# Patient Record
Sex: Female | Born: 1935 | Race: White | Hispanic: No | Smoking: Never smoker
Health system: Southern US, Community
[De-identification: ages and names within clinical notes are randomized; demographics above are authoritative.]

## PROBLEM LIST (undated history)

## (undated) DIAGNOSIS — G629 Polyneuropathy, unspecified: Secondary | ICD-10-CM

## (undated) DIAGNOSIS — E1149 Type 2 diabetes mellitus with other diabetic neurological complication: Secondary | ICD-10-CM

## (undated) DIAGNOSIS — E785 Hyperlipidemia, unspecified: Secondary | ICD-10-CM

## (undated) DIAGNOSIS — H3530 Unspecified macular degeneration: Secondary | ICD-10-CM

## (undated) DIAGNOSIS — E538 Deficiency of other specified B group vitamins: Secondary | ICD-10-CM

## (undated) DIAGNOSIS — I219 Acute myocardial infarction, unspecified: Secondary | ICD-10-CM

## (undated) DIAGNOSIS — Z889 Allergy status to unspecified drugs, medicaments and biological substances status: Secondary | ICD-10-CM

## (undated) DIAGNOSIS — M545 Low back pain, unspecified: Secondary | ICD-10-CM

## (undated) DIAGNOSIS — G709 Myoneural disorder, unspecified: Secondary | ICD-10-CM

## (undated) DIAGNOSIS — K219 Gastro-esophageal reflux disease without esophagitis: Secondary | ICD-10-CM

## (undated) DIAGNOSIS — G4730 Sleep apnea, unspecified: Secondary | ICD-10-CM

## (undated) DIAGNOSIS — C801 Malignant (primary) neoplasm, unspecified: Secondary | ICD-10-CM

## (undated) DIAGNOSIS — D751 Secondary polycythemia: Secondary | ICD-10-CM

## (undated) DIAGNOSIS — E876 Hypokalemia: Secondary | ICD-10-CM

## (undated) DIAGNOSIS — K5900 Constipation, unspecified: Secondary | ICD-10-CM

## (undated) DIAGNOSIS — D649 Anemia, unspecified: Secondary | ICD-10-CM

## (undated) DIAGNOSIS — F32A Depression, unspecified: Secondary | ICD-10-CM

## (undated) DIAGNOSIS — M1990 Unspecified osteoarthritis, unspecified site: Secondary | ICD-10-CM

## (undated) DIAGNOSIS — F329 Major depressive disorder, single episode, unspecified: Secondary | ICD-10-CM

## (undated) DIAGNOSIS — I10 Essential (primary) hypertension: Secondary | ICD-10-CM

## (undated) DIAGNOSIS — E039 Hypothyroidism, unspecified: Secondary | ICD-10-CM

## (undated) HISTORY — PX: COLONOSCOPY: SHX174

## (undated) HISTORY — DX: Low back pain, unspecified: M54.50

## (undated) HISTORY — DX: Deficiency of other specified B group vitamins: E53.8

## (undated) HISTORY — DX: Anemia, unspecified: D64.9

## (undated) HISTORY — DX: Constipation, unspecified: K59.00

## (undated) HISTORY — DX: Hypokalemia: E87.6

## (undated) HISTORY — DX: Low back pain: M54.5

## (undated) HISTORY — DX: Hyperlipidemia, unspecified: E78.5

## (undated) HISTORY — PX: OTHER SURGICAL HISTORY: SHX169

## (undated) HISTORY — DX: Polyneuropathy, unspecified: G62.9

## (undated) HISTORY — DX: Secondary polycythemia: D75.1

## (undated) HISTORY — PX: TONSILLECTOMY: SUR1361

## (undated) HISTORY — DX: Type 2 diabetes mellitus with other diabetic neurological complication: E11.49

---

## 1982-02-22 HISTORY — PX: ABDOMINAL HYSTERECTOMY: SHX81

## 2006-03-08 ENCOUNTER — Emergency Department (HOSPITAL_COMMUNITY): Admission: EM | Admit: 2006-03-08 | Discharge: 2006-03-08 | Payer: Self-pay | Admitting: Emergency Medicine

## 2010-02-22 LAB — HM COLONOSCOPY

## 2011-01-21 ENCOUNTER — Encounter (INDEPENDENT_AMBULATORY_CARE_PROVIDER_SITE_OTHER): Payer: Self-pay | Admitting: Ophthalmology

## 2011-01-29 ENCOUNTER — Encounter (INDEPENDENT_AMBULATORY_CARE_PROVIDER_SITE_OTHER): Payer: Medicare Other | Admitting: Ophthalmology

## 2011-01-29 DIAGNOSIS — I10 Essential (primary) hypertension: Secondary | ICD-10-CM

## 2011-01-29 DIAGNOSIS — E11319 Type 2 diabetes mellitus with unspecified diabetic retinopathy without macular edema: Secondary | ICD-10-CM

## 2011-01-29 DIAGNOSIS — E1139 Type 2 diabetes mellitus with other diabetic ophthalmic complication: Secondary | ICD-10-CM

## 2011-01-29 DIAGNOSIS — H3530 Unspecified macular degeneration: Secondary | ICD-10-CM

## 2011-02-02 DIAGNOSIS — H3530 Unspecified macular degeneration: Secondary | ICD-10-CM

## 2011-02-02 DIAGNOSIS — E1136 Type 2 diabetes mellitus with diabetic cataract: Secondary | ICD-10-CM

## 2011-02-02 NOTE — H&P (Signed)
Abigail Edwards is an 76 y.o. female.   Chief Complaint: Poor vision in the left eye HPI: Gradual decrease in vision  Left eye.  difficulty reading and performing household tasks  No past medical history on file.  No past surgical history on file.  No family history on file. Social History:  does not have a smoking history on file. She does not have any smokeless tobacco history on file. Her alcohol and drug histories not on file.  Allergies: Allergies not on file  No current facility-administered medications on file as of .   No current outpatient prescriptions on file as of .    Review of systems otherwise negative  There were no vitals taken for this visit.  Physical exam: Mental status: oriented x3. Eyes: See eye exam scanned into media center associated with this encounter/date Ears, Nose, Throat: within normal limits Neck: Within Normal limits General: within normal limits Chest: Within normal limits Breast: deferred Heart: Within normal limits Abdomen: Within normal limits GU: deferred Extremities: within normal limits Skin: within normal limits  Assessment/Plan Cataract interfering with vision Left eye  Plan: To [facility] for cataract removal with phacoemulsification and placement of Intra Ocular Lens : left eye  Sherrie George 02/02/2011, 7:37 AM

## 2011-02-09 ENCOUNTER — Encounter (HOSPITAL_COMMUNITY): Payer: Self-pay

## 2011-02-23 LAB — HM MAMMOGRAPHY

## 2011-02-24 MED ORDER — GATIFLOXACIN 0.5 % OP SOLN: 1.0000 [drp] | OPHTHALMIC | Status: DC

## 2011-02-24 MED ORDER — TROPICAMIDE 1 % OP SOLN: 1.0000 [drp] | OPHTHALMIC | Status: DC

## 2011-02-24 MED ORDER — CYCLOPENTOLATE HCL 1 % OP SOLN: 1.0000 [drp] | OPHTHALMIC | Status: DC

## 2011-02-24 MED ORDER — PHENYLEPHRINE HCL 2.5 % OP SOLN: 1.0000 [drp] | OPHTHALMIC | Status: DC

## 2011-02-25 ENCOUNTER — Encounter (HOSPITAL_COMMUNITY): Admission: RE | Payer: Self-pay | Source: Ambulatory Visit

## 2011-02-25 ENCOUNTER — Ambulatory Visit (HOSPITAL_COMMUNITY): Admission: RE | Admit: 2011-02-25 | Payer: Medicare Other | Source: Ambulatory Visit | Admitting: Ophthalmology

## 2011-02-25 SURGERY — PHACOEMULSIFICATION, CATARACT, WITH IOL INSERTION
Anesthesia: Monitor Anesthesia Care | Laterality: Left

## 2011-03-05 ENCOUNTER — Inpatient Hospital Stay (INDEPENDENT_AMBULATORY_CARE_PROVIDER_SITE_OTHER): Payer: Medicare Other | Admitting: Ophthalmology

## 2011-03-08 DIAGNOSIS — Z1231 Encounter for screening mammogram for malignant neoplasm of breast: Secondary | ICD-10-CM | POA: Diagnosis not present

## 2011-03-08 DIAGNOSIS — D45 Polycythemia vera: Secondary | ICD-10-CM | POA: Diagnosis not present

## 2011-03-15 DIAGNOSIS — R928 Other abnormal and inconclusive findings on diagnostic imaging of breast: Secondary | ICD-10-CM | POA: Diagnosis not present

## 2011-04-11 ENCOUNTER — Other Ambulatory Visit (INDEPENDENT_AMBULATORY_CARE_PROVIDER_SITE_OTHER): Payer: Self-pay | Admitting: Ophthalmology

## 2011-04-11 NOTE — H&P (Signed)
Abigail Edwards is an 77 y.o. female.   Chief Complaint: Poor vision  Left eye  HPI: Gradual decrease in vision  Left eye difficulty reading and performing household tasks  PMH  Diabetes x 2 years.  Hypertension. Hypothyroid  No past surgical history on file.  No family history on file. Social History:  does not have a smoking history on file. She does not have any smokeless tobacco history on file. Her alcohol and drug histories not on file.  Allergies:  Allergies  Allergen Reactions  . Morphine And Related Other (See Comments)    Pt "went out of her mind"  . Penicillins Rash    No current facility-administered medications on file as of .   Medications Prior to Admission  Medication Sig Dispense Refill  . aspirin EC 81 MG tablet Take 81 mg by mouth daily.        . Cholecalciferol (VITAMIN D3) 5000 UNITS CAPS Take 1 capsule by mouth daily.        . citalopram (CELEXA) 40 MG tablet Take 40 mg by mouth daily.        . Cyanocobalamin (VITAMIN B-12) 2500 MCG SUBL Place 1 tablet under the tongue daily.        Marland Kitchen gabapentin (NEURONTIN) 100 MG capsule Take 100 mg by mouth 2 (two) times daily.        Marland Kitchen guaiFENesin (MUCINEX) 600 MG 12 hr tablet Take 600 mg by mouth 2 (two) times daily as needed. congestion       . metFORMIN (GLUCOPHAGE) 500 MG tablet Take 500 mg by mouth 2 (two) times daily with a meal.        . Multiple Vitamins-Minerals (PRESERVISION/LUTEIN PO) Take 1 tablet by mouth 2 (two) times daily.        . potassium chloride (KLOR-CON) 10 MEQ CR tablet Take 10 mEq by mouth 2 (two) times daily.        . ranitidine (ZANTAC) 150 MG tablet Take 150 mg by mouth 2 (two) times daily.        Marland Kitchen triamterene-hydrochlorothiazide (MAXZIDE-25) 37.5-25 MG per tablet Take 1 tablet by mouth daily.          Review of systems otherwise negative  There were no vitals taken for this visit.  Physical exam: Mental status: oriented x3. Eyes: See eye exam on this date of surgery for this patient  scanned in by scanning center.  See media tab Ears, Nose, Throat: within normal limits Neck: Within Normal limits General: within normal limits Chest: Within normal limits Breast: deferred Heart: Within normal limits Abdomen: Within normal limits GU: deferred Extremities: within normal limits Skin: within normal limits  Assessment/Plan Cataract interfering with vision Left eye  Plan: To [facility] for cataract removal with phacoemulsification and placement of Intra Ocular Lens :Left eye  Abigail Edwards, Beulah Gandy 04/11/2011, 1:33 PM

## 2011-04-14 DIAGNOSIS — L609 Nail disorder, unspecified: Secondary | ICD-10-CM | POA: Diagnosis not present

## 2011-04-14 DIAGNOSIS — E119 Type 2 diabetes mellitus without complications: Secondary | ICD-10-CM | POA: Diagnosis not present

## 2011-04-14 DIAGNOSIS — E1159 Type 2 diabetes mellitus with other circulatory complications: Secondary | ICD-10-CM | POA: Diagnosis not present

## 2011-04-18 DIAGNOSIS — M25869 Other specified joint disorders, unspecified knee: Secondary | ICD-10-CM | POA: Diagnosis not present

## 2011-04-18 DIAGNOSIS — Z23 Encounter for immunization: Secondary | ICD-10-CM | POA: Diagnosis not present

## 2011-04-18 DIAGNOSIS — I10 Essential (primary) hypertension: Secondary | ICD-10-CM | POA: Diagnosis not present

## 2011-04-18 DIAGNOSIS — Z79899 Other long term (current) drug therapy: Secondary | ICD-10-CM | POA: Diagnosis not present

## 2011-04-18 DIAGNOSIS — M19049 Primary osteoarthritis, unspecified hand: Secondary | ICD-10-CM | POA: Diagnosis not present

## 2011-04-18 DIAGNOSIS — Z7982 Long term (current) use of aspirin: Secondary | ICD-10-CM | POA: Diagnosis not present

## 2011-04-18 DIAGNOSIS — M25469 Effusion, unspecified knee: Secondary | ICD-10-CM | POA: Diagnosis not present

## 2011-04-18 DIAGNOSIS — S62233A Other displaced fracture of base of first metacarpal bone, unspecified hand, initial encounter for closed fracture: Secondary | ICD-10-CM | POA: Diagnosis not present

## 2011-04-18 DIAGNOSIS — S59909A Unspecified injury of unspecified elbow, initial encounter: Secondary | ICD-10-CM | POA: Diagnosis not present

## 2011-04-18 DIAGNOSIS — M949 Disorder of cartilage, unspecified: Secondary | ICD-10-CM | POA: Diagnosis not present

## 2011-04-18 DIAGNOSIS — E119 Type 2 diabetes mellitus without complications: Secondary | ICD-10-CM | POA: Diagnosis not present

## 2011-04-18 DIAGNOSIS — S99929A Unspecified injury of unspecified foot, initial encounter: Secondary | ICD-10-CM | POA: Diagnosis not present

## 2011-04-18 DIAGNOSIS — S8990XA Unspecified injury of unspecified lower leg, initial encounter: Secondary | ICD-10-CM | POA: Diagnosis not present

## 2011-04-18 DIAGNOSIS — M899 Disorder of bone, unspecified: Secondary | ICD-10-CM | POA: Diagnosis not present

## 2011-04-18 DIAGNOSIS — IMO0002 Reserved for concepts with insufficient information to code with codable children: Secondary | ICD-10-CM | POA: Diagnosis not present

## 2011-04-18 DIAGNOSIS — S59919A Unspecified injury of unspecified forearm, initial encounter: Secondary | ICD-10-CM | POA: Diagnosis not present

## 2011-04-18 DIAGNOSIS — M11869 Other specified crystal arthropathies, unspecified knee: Secondary | ICD-10-CM | POA: Diagnosis not present

## 2011-04-18 DIAGNOSIS — M25849 Other specified joint disorders, unspecified hand: Secondary | ICD-10-CM | POA: Diagnosis not present

## 2011-04-21 ENCOUNTER — Encounter (HOSPITAL_COMMUNITY): Payer: Self-pay | Admitting: Pharmacy Technician

## 2011-04-21 DIAGNOSIS — S62319A Displaced fracture of base of unspecified metacarpal bone, initial encounter for closed fracture: Secondary | ICD-10-CM | POA: Diagnosis not present

## 2011-04-21 DIAGNOSIS — M25569 Pain in unspecified knee: Secondary | ICD-10-CM | POA: Diagnosis not present

## 2011-04-21 DIAGNOSIS — M25549 Pain in joints of unspecified hand: Secondary | ICD-10-CM | POA: Diagnosis not present

## 2011-05-03 ENCOUNTER — Other Ambulatory Visit (HOSPITAL_COMMUNITY): Payer: Self-pay | Admitting: *Deleted

## 2011-05-03 ENCOUNTER — Encounter (HOSPITAL_COMMUNITY): Payer: Self-pay | Admitting: *Deleted

## 2011-05-03 DIAGNOSIS — S62319A Displaced fracture of base of unspecified metacarpal bone, initial encounter for closed fracture: Secondary | ICD-10-CM | POA: Diagnosis not present

## 2011-05-03 DIAGNOSIS — M25569 Pain in unspecified knee: Secondary | ICD-10-CM | POA: Diagnosis not present

## 2011-05-03 MED ORDER — CLINDAMYCIN PHOSPHATE 600 MG/50ML IV SOLN
600.0000 mg | INTRAVENOUS | Status: DC | PRN
  Filled 2011-05-03: qty 50
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
## 2011-05-04 ENCOUNTER — Other Ambulatory Visit (HOSPITAL_COMMUNITY): Payer: Self-pay | Admitting: Ophthalmology

## 2011-05-04 ENCOUNTER — Other Ambulatory Visit: Payer: Self-pay

## 2011-05-04 ENCOUNTER — Encounter (HOSPITAL_COMMUNITY): Payer: Self-pay | Admitting: Anesthesiology

## 2011-05-04 ENCOUNTER — Encounter (INDEPENDENT_AMBULATORY_CARE_PROVIDER_SITE_OTHER): Payer: Medicare Other | Admitting: Ophthalmology

## 2011-05-04 ENCOUNTER — Encounter (HOSPITAL_COMMUNITY)
Admission: RE | Disposition: A | Discharge status: Home / Self Care | Payer: Self-pay | Source: Ambulatory Visit | Attending: Ophthalmology

## 2011-05-04 ENCOUNTER — Ambulatory Visit (HOSPITAL_COMMUNITY): Payer: Medicare Other | Admitting: Anesthesiology

## 2011-05-04 ENCOUNTER — Ambulatory Visit (HOSPITAL_COMMUNITY): Payer: Medicare Other

## 2011-05-04 ENCOUNTER — Ambulatory Visit (HOSPITAL_COMMUNITY)
Admission: RE | Admit: 2011-05-04 | Discharge: 2011-05-05 | Disposition: A | Discharge status: Home / Self Care | Payer: Medicare Other | Source: Ambulatory Visit | Attending: Ophthalmology | Admitting: Ophthalmology

## 2011-05-04 ENCOUNTER — Encounter (HOSPITAL_COMMUNITY): Payer: Self-pay | Admitting: *Deleted

## 2011-05-04 DIAGNOSIS — H3530 Unspecified macular degeneration: Secondary | ICD-10-CM | POA: Diagnosis not present

## 2011-05-04 DIAGNOSIS — H43819 Vitreous degeneration, unspecified eye: Secondary | ICD-10-CM

## 2011-05-04 DIAGNOSIS — E039 Hypothyroidism, unspecified: Secondary | ICD-10-CM | POA: Diagnosis not present

## 2011-05-04 DIAGNOSIS — J42 Unspecified chronic bronchitis: Secondary | ICD-10-CM | POA: Diagnosis not present

## 2011-05-04 DIAGNOSIS — Z23 Encounter for immunization: Secondary | ICD-10-CM | POA: Insufficient documentation

## 2011-05-04 DIAGNOSIS — H2510 Age-related nuclear cataract, unspecified eye: Secondary | ICD-10-CM

## 2011-05-04 DIAGNOSIS — H269 Unspecified cataract: Secondary | ICD-10-CM | POA: Insufficient documentation

## 2011-05-04 DIAGNOSIS — E1136 Type 2 diabetes mellitus with diabetic cataract: Secondary | ICD-10-CM

## 2011-05-04 DIAGNOSIS — Z01811 Encounter for preprocedural respiratory examination: Secondary | ICD-10-CM | POA: Diagnosis not present

## 2011-05-04 DIAGNOSIS — I517 Cardiomegaly: Secondary | ICD-10-CM | POA: Diagnosis not present

## 2011-05-04 DIAGNOSIS — E119 Type 2 diabetes mellitus without complications: Secondary | ICD-10-CM | POA: Insufficient documentation

## 2011-05-04 DIAGNOSIS — H579 Unspecified disorder of eye and adnexa: Secondary | ICD-10-CM | POA: Diagnosis not present

## 2011-05-04 DIAGNOSIS — E1139 Type 2 diabetes mellitus with other diabetic ophthalmic complication: Secondary | ICD-10-CM | POA: Diagnosis not present

## 2011-05-04 DIAGNOSIS — I10 Essential (primary) hypertension: Secondary | ICD-10-CM | POA: Insufficient documentation

## 2011-05-04 HISTORY — PX: CATARACT EXTRACTION W/PHACO: SHX586

## 2011-05-04 HISTORY — DX: Allergy status to unspecified drugs, medicaments and biological substances: Z88.9

## 2011-05-04 HISTORY — DX: Sleep apnea, unspecified: G47.30

## 2011-05-04 HISTORY — DX: Gastro-esophageal reflux disease without esophagitis: K21.9

## 2011-05-04 HISTORY — DX: Malignant (primary) neoplasm, unspecified: C80.1

## 2011-05-04 HISTORY — DX: Acute myocardial infarction, unspecified: I21.9

## 2011-05-04 HISTORY — DX: Essential (primary) hypertension: I10

## 2011-05-04 HISTORY — DX: Major depressive disorder, single episode, unspecified: F32.9

## 2011-05-04 HISTORY — DX: Unspecified osteoarthritis, unspecified site: M19.90

## 2011-05-04 HISTORY — DX: Hypothyroidism, unspecified: E03.9

## 2011-05-04 HISTORY — DX: Depression, unspecified: F32.A

## 2011-05-04 LAB — CBC
HCT: 40.6 % (ref 36.0–46.0)
Hemoglobin: 13.2 g/dL (ref 12.0–15.0)
MCH: 28 pg (ref 26.0–34.0)
MCHC: 32.5 g/dL (ref 30.0–36.0)
MCV: 86 fL (ref 78.0–100.0)
Platelets: 363 10*3/uL (ref 150–400)
RBC: 4.72 MIL/uL (ref 3.87–5.11)
RDW: 14.3 % (ref 11.5–15.5)
WBC: 9.8 10*3/uL (ref 4.0–10.5)

## 2011-05-04 LAB — BASIC METABOLIC PANEL
BUN: 9 mg/dL (ref 6–23)
CO2: 34 mEq/L — ABNORMAL HIGH (ref 19–32)
Calcium: 9.5 mg/dL (ref 8.4–10.5)
Chloride: 92 mEq/L — ABNORMAL LOW (ref 96–112)
Creatinine, Ser: 0.6 mg/dL (ref 0.50–1.10)
GFR calc Af Amer: >90 mL/min (ref >90)
GFR calc non Af Amer: 88 mL/min — ABNORMAL LOW (ref >90)
Glucose, Bld: 87 mg/dL (ref 70–99)
Potassium: 3.6 mEq/L (ref 3.5–5.1)
Sodium: 133 mEq/L — ABNORMAL LOW (ref 135–145)

## 2011-05-04 LAB — GLUCOSE, CAPILLARY
Glucose-Capillary: 76 mg/dL (ref 70–99)
Glucose-Capillary: 92 mg/dL (ref 70–99)
Glucose-Capillary: 157 mg/dL — ABNORMAL HIGH (ref 70–99)

## 2011-05-04 SURGERY — PHACOEMULSIFICATION, CATARACT, WITH IOL INSERTION
Anesthesia: General | Laterality: Left | Wound class: Clean

## 2011-05-04 MED ORDER — INSULIN ASPART 100 UNIT/ML ~~LOC~~ SOLN: 0.0000 [IU] | Freq: Three times a day (TID) | SUBCUTANEOUS | Status: DC

## 2011-05-04 MED ORDER — MEPERIDINE HCL 25 MG/ML IJ SOLN: 6.2500 mg | INTRAMUSCULAR | Status: DC | PRN

## 2011-05-04 MED ORDER — TETRACAINE HCL 0.5 % OP SOLN
2.0000 [drp] | OPHTHALMIC | Status: DC | PRN
  Filled 2011-05-04: qty 2

## 2011-05-04 MED ORDER — ROCURONIUM BROMIDE 100 MG/10ML IV SOLN
INTRAVENOUS | Status: DC | PRN
  Administered 2011-05-04: 30 mg via INTRAVENOUS

## 2011-05-04 MED ORDER — BUPIVACAINE HCL 0.75 % IJ SOLN
INTRAMUSCULAR | Status: DC | PRN
  Administered 2011-05-04: 10 mL

## 2011-05-04 MED ORDER — EPINEPHRINE HCL 1 MG/ML IJ SOLN
INTRAMUSCULAR | Status: DC | PRN
  Administered 2011-05-04: .3 mL

## 2011-05-04 MED ORDER — GATIFLOXACIN 0.5 % OP SOLN
1.0000 [drp] | OPHTHALMIC | Status: AC | PRN
  Administered 2011-05-04 (×3): 1 [drp] via OPHTHALMIC

## 2011-05-04 MED ORDER — FENTANYL CITRATE 0.05 MG/ML IJ SOLN
INTRAMUSCULAR | Status: DC | PRN
  Administered 2011-05-04: 50 ug via INTRAVENOUS
  Administered 2011-05-04: 100 ug via INTRAVENOUS

## 2011-05-04 MED ORDER — HYDROMORPHONE HCL PF 1 MG/ML IJ SOLN
0.2500 mg | INTRAMUSCULAR | Status: DC | PRN
  Administered 2011-05-04 (×4): 0.25 mg via INTRAVENOUS

## 2011-05-04 MED ORDER — MAGNESIUM HYDROXIDE 400 MG/5ML PO SUSP: 15.0000 mL | Freq: Four times a day (QID) | ORAL | Status: DC | PRN

## 2011-05-04 MED ORDER — GATIFLOXACIN 0.5 % OP SOLN
1.0000 [drp] | Freq: Four times a day (QID) | OPHTHALMIC | Status: DC
  Filled 2011-05-04: qty 2.5

## 2011-05-04 MED ORDER — METFORMIN HCL 500 MG PO TABS
500.0000 mg | ORAL_TABLET | Freq: Two times a day (BID) | ORAL | Status: DC
  Filled 2011-05-04: qty 1

## 2011-05-04 MED ORDER — CYCLOPENTOLATE HCL 1 % OP SOLN
OPHTHALMIC | Status: AC
  Administered 2011-05-04: 1 [drp] via OPHTHALMIC
  Filled 2011-05-04: qty 2

## 2011-05-04 MED ORDER — LIDOCAINE HCL 2 % IJ SOLN
INTRAMUSCULAR | Status: DC | PRN
  Administered 2011-05-04: 10 mL

## 2011-05-04 MED ORDER — TROPICAMIDE 1 % OP SOLN
OPHTHALMIC | Status: AC
  Administered 2011-05-04: 1 [drp] via OPHTHALMIC
  Filled 2011-05-04: qty 3

## 2011-05-04 MED ORDER — BSS IO SOLN
INTRAOCULAR | Status: DC | PRN
  Administered 2011-05-04: 500 mL via INTRAOCULAR

## 2011-05-04 MED ORDER — INSULIN ASPART 100 UNIT/ML ~~LOC~~ SOLN: 0.0000 [IU] | Freq: Every day | SUBCUTANEOUS | Status: DC

## 2011-05-04 MED ORDER — PHENYLEPHRINE HCL 2.5 % OP SOLN
1.0000 [drp] | OPHTHALMIC | Status: AC | PRN
  Administered 2011-05-04 (×3): 1 [drp] via OPHTHALMIC

## 2011-05-04 MED ORDER — ACETAZOLAMIDE SODIUM 500 MG IJ SOLR
500.0000 mg | Freq: Once | INTRAMUSCULAR | Status: AC
  Administered 2011-05-05: 500 mg via INTRAVENOUS
  Filled 2011-05-04: qty 500

## 2011-05-04 MED ORDER — SODIUM CHLORIDE 0.9 % IJ SOLN
INTRAMUSCULAR | Status: DC | PRN
  Administered 2011-05-04: 12:00:00

## 2011-05-04 MED ORDER — PNEUMOCOCCAL VAC POLYVALENT 25 MCG/0.5ML IJ INJ
0.5000 mL | INJECTION | INTRAMUSCULAR | Status: AC
  Administered 2011-05-05: 0.5 mL via INTRAMUSCULAR
  Filled 2011-05-04 (×2): qty 0.5

## 2011-05-04 MED ORDER — SODIUM CHLORIDE 0.9 % IV SOLN
INTRAVENOUS | Status: DC | PRN
  Administered 2011-05-04 (×2): via INTRAVENOUS

## 2011-05-04 MED ORDER — PHENYLEPHRINE HCL 2.5 % OP SOLN
OPHTHALMIC | Status: AC
  Administered 2011-05-04: 1 [drp] via OPHTHALMIC
  Filled 2011-05-04: qty 3

## 2011-05-04 MED ORDER — MORPHINE SULFATE 2 MG/ML IJ SOLN
1.0000 mg | INTRAMUSCULAR | Status: AC | PRN
  Administered 2011-05-04 (×2): 1 mg via INTRAVENOUS

## 2011-05-04 MED ORDER — LATANOPROST 0.005 % OP SOLN
1.0000 [drp] | Freq: Every day | OPHTHALMIC | Status: DC
  Filled 2011-05-04: qty 2.5

## 2011-05-04 MED ORDER — DEXTROSE 5 % IV SOLN
INTRAVENOUS | Status: AC
  Filled 2011-05-04: qty 50

## 2011-05-04 MED ORDER — DEXAMETHASONE SODIUM PHOSPHATE 10 MG/ML IJ SOLN
INTRAMUSCULAR | Status: DC | PRN
  Administered 2011-05-04: 10 mg

## 2011-05-04 MED ORDER — PREDNISOLONE ACETATE 1 % OP SUSP
1.0000 [drp] | Freq: Four times a day (QID) | OPHTHALMIC | Status: DC
  Filled 2011-05-04: qty 1

## 2011-05-04 MED ORDER — NA CHONDROIT SULF-NA HYALURON 40-30 MG/ML IO SOLN
INTRAOCULAR | Status: DC | PRN
  Administered 2011-05-04: 0.5 mL via INTRAOCULAR

## 2011-05-04 MED ORDER — TROPICAMIDE 1 % OP SOLN
1.0000 [drp] | OPHTHALMIC | Status: AC | PRN
  Administered 2011-05-04 (×3): 1 [drp] via OPHTHALMIC

## 2011-05-04 MED ORDER — PROPOFOL 10 MG/ML IV EMUL
INTRAVENOUS | Status: DC | PRN
  Administered 2011-05-04: 180 mg via INTRAVENOUS

## 2011-05-04 MED ORDER — CLINDAMYCIN PHOSPHATE 600 MG/4ML IJ SOLN
INTRAMUSCULAR | Status: AC
  Filled 2011-05-04: qty 4

## 2011-05-04 MED ORDER — NEOSTIGMINE METHYLSULFATE 1 MG/ML IJ SOLN
INTRAMUSCULAR | Status: DC | PRN
  Administered 2011-05-04: 5 mg via INTRAVENOUS

## 2011-05-04 MED ORDER — SODIUM CHLORIDE 0.45 % IV SOLN
INTRAVENOUS | Status: DC
  Administered 2011-05-04: 20:00:00 via INTRAVENOUS

## 2011-05-04 MED ORDER — GLYCOPYRROLATE 0.2 MG/ML IJ SOLN
INTRAMUSCULAR | Status: DC | PRN
  Administered 2011-05-04: .8 mg via INTRAVENOUS

## 2011-05-04 MED ORDER — HYALURONIDASE HUMAN 150 UNIT/ML IJ SOLN
INTRAMUSCULAR | Status: DC | PRN
  Administered 2011-05-04: 150 [IU]

## 2011-05-04 MED ORDER — GATIFLOXACIN 0.5 % OP SOLN
OPHTHALMIC | Status: AC
  Administered 2011-05-04: 1 [drp] via OPHTHALMIC
  Filled 2011-05-04: qty 2.5

## 2011-05-04 MED ORDER — ONDANSETRON HCL 4 MG/2ML IJ SOLN
INTRAMUSCULAR | Status: DC | PRN
  Administered 2011-05-04: 4 mg via INTRAVENOUS

## 2011-05-04 MED ORDER — ACETAMINOPHEN 325 MG PO TABS
325.0000 mg | ORAL_TABLET | ORAL | Status: DC | PRN
  Administered 2011-05-04: 650 mg via ORAL
  Filled 2011-05-04: qty 2

## 2011-05-04 MED ORDER — INSULIN ASPART 100 UNIT/ML ~~LOC~~ SOLN
0.0000 [IU] | Freq: Three times a day (TID) | SUBCUTANEOUS | Status: DC
Start: 2011-05-05 — End: 2011-05-04

## 2011-05-04 MED ORDER — PROVISC 10 MG/ML IO SOLN
INTRAOCULAR | Status: DC | PRN
  Administered 2011-05-04: .85 mL via INTRAOCULAR

## 2011-05-04 MED ORDER — BACITRACIN-POLYMYXIN B 500-10000 UNIT/GM OP OINT
TOPICAL_OINTMENT | OPHTHALMIC | Status: DC | PRN
  Administered 2011-05-04: 1 via OPHTHALMIC

## 2011-05-04 MED ORDER — BRIMONIDINE TARTRATE 0.2 % OP SOLN
1.0000 [drp] | Freq: Two times a day (BID) | OPHTHALMIC | Status: DC
  Filled 2011-05-04: qty 5

## 2011-05-04 MED ORDER — CYCLOPENTOLATE HCL 1 % OP SOLN
1.0000 [drp] | OPHTHALMIC | Status: AC | PRN
  Administered 2011-05-04 (×3): 1 [drp] via OPHTHALMIC

## 2011-05-04 MED ORDER — CLINDAMYCIN PHOSPHATE 600 MG/50ML IV SOLN
INTRAVENOUS | Status: DC | PRN
  Administered 2011-05-04: 600 mg via INTRAVENOUS

## 2011-05-04 MED ORDER — METFORMIN HCL 500 MG PO TABS
500.0000 mg | ORAL_TABLET | Freq: Two times a day (BID) | ORAL | Status: DC
  Administered 2011-05-04 – 2011-05-05 (×2): 500 mg via ORAL
  Filled 2011-05-04 (×4): qty 1

## 2011-05-04 MED ORDER — MORPHINE SULFATE 2 MG/ML IJ SOLN
INTRAMUSCULAR | Status: AC
  Filled 2011-05-04: qty 1

## 2011-05-04 MED ORDER — ONDANSETRON HCL 4 MG/2ML IJ SOLN: 4.0000 mg | Freq: Four times a day (QID) | INTRAMUSCULAR | Status: DC | PRN

## 2011-05-04 MED ORDER — PROMETHAZINE HCL 25 MG/ML IJ SOLN: 6.2500 mg | INTRAMUSCULAR | Status: DC | PRN

## 2011-05-04 MED ORDER — EPHEDRINE SULFATE 50 MG/ML IJ SOLN
INTRAMUSCULAR | Status: DC | PRN
  Administered 2011-05-04: 10 mg via INTRAVENOUS

## 2011-05-04 MED ORDER — SODIUM CHLORIDE 0.9 % IV SOLN
INTRAVENOUS | Status: DC
  Administered 2011-05-04: 12:00:00 via INTRAVENOUS

## 2011-05-04 SURGICAL SUPPLY — 51 items
APL SRG 3 HI ABS STRL LF PLS (MISCELLANEOUS) ×1
APPLICATOR DR MATTHEWS STRL (MISCELLANEOUS) ×2 IMPLANT
BALL CTTN LRG ABS STRL LF (GAUZE/BANDAGES/DRESSINGS) ×3
BLADE KERATOME 2.75 (BLADE) ×2 IMPLANT
BLADE MINI RND TIP GREEN BEAV (BLADE) IMPLANT
BLADE STAB KNIFE 15DEG (BLADE) IMPLANT
CANNULA ANTERIOR CHAMBER 27GA (MISCELLANEOUS) ×2 IMPLANT
CLOTH BEACON ORANGE TIMEOUT ST (SAFETY) ×2 IMPLANT
CORDS BIPOLAR (ELECTRODE) ×2 IMPLANT
COTTONBALL LRG STERILE PKG (GAUZE/BANDAGES/DRESSINGS) ×6 IMPLANT
DRAPE OPHTHALMIC 40X48 W POUCH (DRAPES) ×2 IMPLANT
EYE SHIELD UNIVERSAL CLEAR (GAUZE/BANDAGES/DRESSINGS) ×1 IMPLANT
GLOVE SS BIOGEL STRL SZ 6.5 (GLOVE) ×1 IMPLANT
GLOVE SS BIOGEL STRL SZ 7 (GLOVE) ×1 IMPLANT
GLOVE SUPERSENSE BIOGEL SZ 6.5 (GLOVE) ×1
GLOVE SUPERSENSE BIOGEL SZ 7 (GLOVE) ×1
GLOVE SURG 8.5 LATEX PF (GLOVE) ×2 IMPLANT
GLOVE SURG SS PI 6.0 STRL IVOR (GLOVE) ×1 IMPLANT
GLOVE SURG SS PI 6.5 STRL IVOR (GLOVE) ×2 IMPLANT
GOWN STRL NON-REIN LRG LVL3 (GOWN DISPOSABLE) ×6 IMPLANT
KIT ROOM TURNOVER OR (KITS) ×2 IMPLANT
KNIFE CRESCENT 2.5 55 ANG (BLADE) ×2 IMPLANT
LENS IOL ACRSF IQ PC 24.5 (Intraocular Lens) IMPLANT
LENS IOL ACRYSOF IQ POST 24.5 (Intraocular Lens) ×2 IMPLANT
MARKER SKIN DUAL TIP RULER LAB (MISCELLANEOUS) ×2 IMPLANT
NDL 18GX1X1/2 (RX/OR ONLY) (NEEDLE) ×1 IMPLANT
NDL 25GX 5/8IN NON SAFETY (NEEDLE) IMPLANT
NDL HYPO 30X.5 LL (NEEDLE) ×1 IMPLANT
NEEDLE 18GX1X1/2 (RX/OR ONLY) (NEEDLE) ×2 IMPLANT
NEEDLE 25GX 5/8IN NON SAFETY (NEEDLE) ×2 IMPLANT
NEEDLE 27GAX1X1/2 (NEEDLE) ×1 IMPLANT
NEEDLE HYPO 30X.5 LL (NEEDLE) ×2 IMPLANT
NS IRRIG 1000ML POUR BTL (IV SOLUTION) ×2 IMPLANT
PACK CATARACT CUSTOM (CUSTOM PROCEDURE TRAY) ×2 IMPLANT
PACK CATARACT MCHSCP (PACKS) ×2 IMPLANT
PAD ARMBOARD 7.5X6 YLW CONV (MISCELLANEOUS) ×4 IMPLANT
PAD EYE OVAL STERILE LF (GAUZE/BANDAGES/DRESSINGS) ×1 IMPLANT
PHACO TIP KELMAN 45DEG (TIP) ×2 IMPLANT
PROBE ANTERIOR 20G W/INFUS NDL (MISCELLANEOUS) IMPLANT
SPEAR EYE SURG WECK-CEL (MISCELLANEOUS) IMPLANT
SUT ETHILON 10 0 CS140 6 (SUTURE) ×1 IMPLANT
SUT VICRYL 8 0 TG140 8 (SUTURE) IMPLANT
SYR 20CC LL (SYRINGE) ×2 IMPLANT
SYR 3ML LL SCALE MARK (SYRINGE) ×2 IMPLANT
SYR BULB 3OZ (MISCELLANEOUS) ×2 IMPLANT
SYR TB 1ML LUER SLIP (SYRINGE) ×1 IMPLANT
TAPE SURG TRANSPORE 1 IN (GAUZE/BANDAGES/DRESSINGS) IMPLANT
TAPE SURGICAL TRANSPORE 1 IN (GAUZE/BANDAGES/DRESSINGS) ×1
TOWEL OR 17X24 6PK STRL BLUE (TOWEL DISPOSABLE) ×4 IMPLANT
WATER STERILE IRR 1000ML POUR (IV SOLUTION) ×2 IMPLANT
WIPE INSTRUMENT VISIWIPE 73X73 (MISCELLANEOUS) ×2 IMPLANT

## 2011-05-04 NOTE — Progress Notes (Signed)
Dr. Jacklynn Bue notified of CBG 52. Patient is not symptomatic at this time. No orders received.

## 2011-05-04 NOTE — Preoperative (Signed)
Beta Blockers   Reason not to administer Beta Blockers:Not Applicable 

## 2011-05-04 NOTE — Anesthesia Preprocedure Evaluation (Addendum)
Anesthesia Evaluation  Patient identified by MRN, date of birth, ID band Patient awake    Reviewed: Allergy & Precautions, H&P , NPO status , Patient's Chart, lab work & pertinent test results  History of Anesthesia Complications Negative for: history of anesthetic complications  Airway Mallampati: I  Neck ROM: Full    Dental  (+) Teeth Intact, Caps and Missing   Pulmonary sleep apnea , pneumonia ,  breath sounds clear to auscultation        Cardiovascular hypertension, - Past MI Rhythm:Regular Rate:Normal + Systolic murmurs    Neuro/Psych PSYCHIATRIC DISORDERS    GI/Hepatic GERD-  ,  Endo/Other  Diabetes mellitus-Hypothyroidism   Renal/GU      Musculoskeletal   Abdominal (+) + obese,   Peds  Hematology   Anesthesia Other Findings   Reproductive/Obstetrics                          Anesthesia Physical Anesthesia Plan  ASA: II  Anesthesia Plan: General   Post-op Pain Management:    Induction: Intravenous  Airway Management Planned: Oral ETT  Additional Equipment:   Intra-op Plan:   Post-operative Plan: Extubation in OR  Informed Consent: I have reviewed the patients History and Physical, chart, labs and discussed the procedure including the risks, benefits and alternatives for the proposed anesthesia with the patient or authorized representative who has indicated his/her understanding and acceptance.   Dental advisory given  Plan Discussed with: CRNA, Surgeon and Anesthesiologist  Anesthesia Plan Comments:        Anesthesia Quick Evaluation

## 2011-05-04 NOTE — Transfer of Care (Signed)
Immediate Anesthesia Transfer of Care Note  Patient: Abigail Edwards  Procedure(s) Performed: Procedure(s) (LRB): CATARACT EXTRACTION PHACO AND INTRAOCULAR LENS PLACEMENT (IOC) (Left)  Patient Location: PACU  Anesthesia Type: General  Level of Consciousness: awake, alert , oriented and patient cooperative  Airway & Oxygen Therapy: Patient Spontanous Breathing and Patient connected to face mask oxygen  Post-op Assessment: Report given to PACU RN  Post vital signs: Reviewed and stable  Complications: No apparent anesthesia complications

## 2011-05-04 NOTE — Progress Notes (Signed)
Report received from Angel RN.

## 2011-05-04 NOTE — Brief Op Note (Signed)
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

## 2011-05-04 NOTE — Anesthesia Postprocedure Evaluation (Signed)
  Anesthesia Post-op Note  Patient: Abigail Edwards  Procedure(s) Performed: Procedure(s) (LRB): CATARACT EXTRACTION PHACO AND INTRAOCULAR LENS PLACEMENT (IOC) (Left)  Patient Location: PACU  Anesthesia Type: General  Level of Consciousness: awake, alert  and oriented  Airway and Oxygen Therapy: Patient Spontanous Breathing and Patient connected to nasal cannula oxygen  Post-op Pain: mild  Post-op Assessment: Post-op Vital signs reviewed, Patient's Cardiovascular Status Stable, Respiratory Function Stable, Patent Airway, No signs of Nausea or vomiting and Pain level controlled  Post-op Vital Signs: Reviewed and stable  Complications: No apparent anesthesia complications

## 2011-05-04 NOTE — Anesthesia Procedure Notes (Signed)
Procedure Name: Intubation Date/Time: 05/04/2011 11:56 AM Performed by: Glendora Score A Pre-anesthesia Checklist: Patient identified, Emergency Drugs available, Suction available and Patient being monitored Patient Re-evaluated:Patient Re-evaluated prior to inductionOxygen Delivery Method: Circle system utilized Preoxygenation: Pre-oxygenation with 100% oxygen Intubation Type: IV induction Ventilation: Mask ventilation without difficulty Laryngoscope Size: Miller and 2 Grade View: Grade I Tube type: Oral Tube size: 7.5 mm Number of attempts: 1 Airway Equipment and Method: Stylet and LTA kit utilized Placement Confirmation: ETT inserted through vocal cords under direct vision,  positive ETCO2 and breath sounds checked- equal and bilateral Secured at: 22 cm Tube secured with: Tape Dental Injury: Teeth and Oropharynx as per pre-operative assessment

## 2011-05-04 NOTE — H&P (Signed)
I examined the patient today and there is no change in the medical status 

## 2011-05-05 LAB — GLUCOSE, CAPILLARY: Glucose-Capillary: 92 mg/dL (ref 70–99)

## 2011-05-05 MED ORDER — PREDNISOLONE ACETATE 1 % OP SUSP: 1.0000 [drp] | Freq: Four times a day (QID) | OPHTHALMIC | Status: AC

## 2011-05-05 MED ORDER — GATIFLOXACIN 0.5 % OP SOLN: 1.0000 [drp] | Freq: Four times a day (QID) | OPHTHALMIC | Status: DC

## 2011-05-05 NOTE — Discharge Summary (Signed)
Discharge summary not needed on [facility] patients per medical records. 

## 2011-05-05 NOTE — Op Note (Signed)
NAMECARMELLE, BAMBERG NO.:  [id number]  MEDICAL RECORD NO.:  [id number]  LOCATION:  5122                         FACILITY:  [facility]  PHYSICIAN:  Beulah Gandy. Ashley Royalty, M.D. DATE OF BIRTH:  23-Jul-1934  DATE OF PROCEDURE:  05/04/2011 DATE OF DISCHARGE:                              OPERATIVE REPORT   ADMISSION DIAGNOSIS:  Dense cataract, left eye.  PROCEDURES:  Cataract extraction with phacoemulsification with placement of primary intraocular lens, left eye.  SURGEON:  Beulah Gandy. Ashley Royalty, M.D.  ASSISTANT:  Rosalie Doctor SA.  ANESTHESIA:  General.  NOTE:  The patient has had a recent orthopedic injury and was having moderate pain in her arm.  She stated that she could not lie still and that she want to go to sleep is the reason for the general anesthetic.  DETAILS:  The lashes were trimmed.  Usual prep and draped.  Placement of lid speculum.  Side port made with #15 blade at 10 o'clock.  Provisc placed into the anterior chamber.  Phacoemulsification wound created at 2 o'clock with the Westcott and 0.12 removal of conjunctiva.  Hemostasis with wet-field cautery of the sclera. Three layered corneal-scleral wound into the anterior chamber with a diamond knife, crescent blade, and keratome.  Cystotome incision at 12 o'clock in the anterior capsule. The capsular forceps were used to grasp the capsule and teared in a circular fashion, 5.5 mm in diameter.  The capsular remnants were removed.  Hydrodissection was performed in 3 locations. Phacoemulsification was begun with 3 grooves into the path of the lens nucleus.  The nucleus was rocked and rotated.  The nucleus was split. An additional split was made after the nucleus was rotated to 90 degrees.  The phacoemulsification was used to rotate and remove the nuclear piece.  It broke into multiple segments and came into the phacoemulsification device.  IA tip was used for cortical clean up. Side port suction was used to remove  cortex at 12 o'clock.  Provisc was added as needed for chamber depth.  Once all of the cortex was removed and the lens material was removed, Provisc was placed again into the capsular bag.  A new intraocular lens made by [employer], model SN60WF, power 20.5 D length 13.0 mm, optic 6.0 mm, expiration date of October 2016, serial number [id number] was brought onto the field, placed in the shooter.  The lens was delivered into the anterior chamber then into the posterior chamber and into the capsular bag.  Sinskey and Kuglen hooks were used to rotate the lens into the 12 o'clock and 6 o'clock position.  The IA device was then used to remove all viscoelastic material from the anterior chamber, posterior chamber, and from around the intraocular lens.  BSS was forced around the intraocular lens so that Provisc was removed from posterior aspect of the lens between the lens in the posterior capsule.  There was no vitreous and there was no capsular opening.  The instruments were removed and the wound was tested.  It was found to be secured; however, the patient has a history of recent coughing and one 10-0 suture was placed at 2 o'clock in  the corneoscleral wound.  The wound was tested again and found to be secured.  Polymyxin and gentamicin were irrigated into [place] space. Marcaine was injected around the globe.  Decadron 10 mg was injected to the lower subconjunctival space.  Closing pressure was 10 with a Barraquer tonometer. Complications none.  Duration 45 minutes.  The patient was awakened and taken to recovery in satisfactory condition.     Beulah Gandy. Ashley Royalty, M.D.     JDM/MEDQ  D:  05/04/2011  T:  05/05/2011  Job:  [id number]

## 2011-05-05 NOTE — Progress Notes (Signed)
Patient had already been given home care instructions by night nurse. Verified instructions were given and home meds gone over. Patient verbalisized understanding of all and acknowledged instructions had been given. 05/05/11/0800

## 2011-05-05 NOTE — Progress Notes (Signed)
05/05/2011, 6:55 AM  Mental Status:  Awake, Alert, Oriented  Anterior segment: Cornea  Clear    Anterior Chamber Clear    Lens:   Clear, IOL, Cataract  Intra Ocular Pressure 17 mmHg with Tonopen  Vitreous: Clear   Retina:  Attached Good laser reaction  Impression: Excellent result Retina attached  Final Diagnosis: Active Problems: Cataract   Plan: start post operative eye drops.  Discharge to home.  Give post operative instructions  Sherrie George 05/05/2011, 6:55 AM

## 2011-05-10 ENCOUNTER — Encounter (HOSPITAL_COMMUNITY): Payer: Self-pay | Admitting: Ophthalmology

## 2011-05-12 ENCOUNTER — Encounter (INDEPENDENT_AMBULATORY_CARE_PROVIDER_SITE_OTHER): Payer: Medicare Other | Admitting: Ophthalmology

## 2011-05-12 DIAGNOSIS — H3530 Unspecified macular degeneration: Secondary | ICD-10-CM

## 2011-05-12 DIAGNOSIS — H2700 Aphakia, unspecified eye: Secondary | ICD-10-CM

## 2011-05-19 DIAGNOSIS — M25569 Pain in unspecified knee: Secondary | ICD-10-CM | POA: Diagnosis not present

## 2011-06-07 DIAGNOSIS — D45 Polycythemia vera: Secondary | ICD-10-CM | POA: Diagnosis not present

## 2011-06-14 ENCOUNTER — Encounter (INDEPENDENT_AMBULATORY_CARE_PROVIDER_SITE_OTHER): Payer: Medicare Other | Admitting: Ophthalmology

## 2011-06-16 ENCOUNTER — Encounter (INDEPENDENT_AMBULATORY_CARE_PROVIDER_SITE_OTHER): Payer: Medicare Other | Admitting: Ophthalmology

## 2011-06-16 DIAGNOSIS — H2700 Aphakia, unspecified eye: Secondary | ICD-10-CM

## 2011-06-16 DIAGNOSIS — M25549 Pain in joints of unspecified hand: Secondary | ICD-10-CM | POA: Diagnosis not present

## 2011-07-07 DIAGNOSIS — E1159 Type 2 diabetes mellitus with other circulatory complications: Secondary | ICD-10-CM | POA: Diagnosis not present

## 2011-07-07 DIAGNOSIS — E119 Type 2 diabetes mellitus without complications: Secondary | ICD-10-CM | POA: Diagnosis not present

## 2011-07-07 DIAGNOSIS — L609 Nail disorder, unspecified: Secondary | ICD-10-CM | POA: Diagnosis not present

## 2011-07-21 DIAGNOSIS — L0390 Cellulitis, unspecified: Secondary | ICD-10-CM | POA: Diagnosis not present

## 2011-07-21 DIAGNOSIS — M25473 Effusion, unspecified ankle: Secondary | ICD-10-CM | POA: Diagnosis not present

## 2011-07-21 DIAGNOSIS — M779 Enthesopathy, unspecified: Secondary | ICD-10-CM | POA: Diagnosis not present

## 2011-07-21 DIAGNOSIS — R609 Edema, unspecified: Secondary | ICD-10-CM | POA: Diagnosis not present

## 2011-07-21 DIAGNOSIS — M79609 Pain in unspecified limb: Secondary | ICD-10-CM | POA: Diagnosis not present

## 2011-07-21 DIAGNOSIS — M109 Gout, unspecified: Secondary | ICD-10-CM | POA: Diagnosis not present

## 2011-07-21 DIAGNOSIS — L0291 Cutaneous abscess, unspecified: Secondary | ICD-10-CM | POA: Diagnosis not present

## 2011-09-15 DIAGNOSIS — E1159 Type 2 diabetes mellitus with other circulatory complications: Secondary | ICD-10-CM | POA: Diagnosis not present

## 2011-09-15 DIAGNOSIS — L609 Nail disorder, unspecified: Secondary | ICD-10-CM | POA: Diagnosis not present

## 2011-09-21 ENCOUNTER — Ambulatory Visit (INDEPENDENT_AMBULATORY_CARE_PROVIDER_SITE_OTHER): Payer: Medicare Other | Admitting: Ophthalmology

## 2011-10-07 ENCOUNTER — Other Ambulatory Visit: Payer: Self-pay | Admitting: Internal Medicine

## 2011-10-07 DIAGNOSIS — Z780 Asymptomatic menopausal state: Secondary | ICD-10-CM

## 2011-10-07 DIAGNOSIS — M545 Low back pain, unspecified: Secondary | ICD-10-CM | POA: Diagnosis not present

## 2011-10-07 DIAGNOSIS — G609 Hereditary and idiopathic neuropathy, unspecified: Secondary | ICD-10-CM | POA: Diagnosis not present

## 2011-10-07 DIAGNOSIS — K2100 Gastro-esophageal reflux disease with esophagitis, without bleeding: Secondary | ICD-10-CM | POA: Diagnosis not present

## 2011-10-07 DIAGNOSIS — E876 Hypokalemia: Secondary | ICD-10-CM | POA: Diagnosis not present

## 2011-10-21 DIAGNOSIS — M810 Age-related osteoporosis without current pathological fracture: Secondary | ICD-10-CM | POA: Diagnosis not present

## 2011-10-21 DIAGNOSIS — IMO0001 Reserved for inherently not codable concepts without codable children: Secondary | ICD-10-CM | POA: Diagnosis not present

## 2011-10-21 DIAGNOSIS — I10 Essential (primary) hypertension: Secondary | ICD-10-CM | POA: Diagnosis not present

## 2011-10-21 DIAGNOSIS — E538 Deficiency of other specified B group vitamins: Secondary | ICD-10-CM | POA: Diagnosis not present

## 2011-10-21 DIAGNOSIS — E785 Hyperlipidemia, unspecified: Secondary | ICD-10-CM | POA: Diagnosis not present

## 2011-10-21 DIAGNOSIS — E039 Hypothyroidism, unspecified: Secondary | ICD-10-CM | POA: Diagnosis not present

## 2011-11-09 ENCOUNTER — Ambulatory Visit
Admission: RE | Admit: 2011-11-09 | Discharge: 2011-11-09 | Disposition: A | Discharge status: Home / Self Care | Payer: Medicare Other | Source: Ambulatory Visit | Attending: Internal Medicine | Admitting: Internal Medicine

## 2011-11-09 DIAGNOSIS — Z780 Asymptomatic menopausal state: Secondary | ICD-10-CM

## 2011-11-09 DIAGNOSIS — M545 Low back pain, unspecified: Secondary | ICD-10-CM | POA: Diagnosis not present

## 2011-11-09 DIAGNOSIS — E876 Hypokalemia: Secondary | ICD-10-CM | POA: Diagnosis not present

## 2011-11-09 DIAGNOSIS — K2100 Gastro-esophageal reflux disease with esophagitis, without bleeding: Secondary | ICD-10-CM | POA: Diagnosis not present

## 2011-11-09 DIAGNOSIS — M810 Age-related osteoporosis without current pathological fracture: Secondary | ICD-10-CM | POA: Diagnosis not present

## 2011-11-09 DIAGNOSIS — M899 Disorder of bone, unspecified: Secondary | ICD-10-CM | POA: Diagnosis not present

## 2011-11-09 DIAGNOSIS — M949 Disorder of cartilage, unspecified: Secondary | ICD-10-CM | POA: Diagnosis not present

## 2011-11-10 LAB — HM DEXA SCAN

## 2011-11-24 DIAGNOSIS — E1159 Type 2 diabetes mellitus with other circulatory complications: Secondary | ICD-10-CM | POA: Diagnosis not present

## 2011-11-24 DIAGNOSIS — L609 Nail disorder, unspecified: Secondary | ICD-10-CM | POA: Diagnosis not present

## 2011-12-31 DIAGNOSIS — M25539 Pain in unspecified wrist: Secondary | ICD-10-CM | POA: Diagnosis not present

## 2012-02-02 DIAGNOSIS — E1159 Type 2 diabetes mellitus with other circulatory complications: Secondary | ICD-10-CM | POA: Diagnosis not present

## 2012-02-02 DIAGNOSIS — B351 Tinea unguium: Secondary | ICD-10-CM | POA: Diagnosis not present

## 2012-02-02 DIAGNOSIS — L851 Acquired keratosis [keratoderma] palmaris et plantaris: Secondary | ICD-10-CM | POA: Diagnosis not present

## 2012-02-04 DIAGNOSIS — M19049 Primary osteoarthritis, unspecified hand: Secondary | ICD-10-CM | POA: Diagnosis not present

## 2012-02-08 DIAGNOSIS — IMO0001 Reserved for inherently not codable concepts without codable children: Secondary | ICD-10-CM | POA: Diagnosis not present

## 2012-02-08 DIAGNOSIS — E876 Hypokalemia: Secondary | ICD-10-CM | POA: Diagnosis not present

## 2012-02-08 DIAGNOSIS — D509 Iron deficiency anemia, unspecified: Secondary | ICD-10-CM | POA: Diagnosis not present

## 2012-02-08 DIAGNOSIS — M810 Age-related osteoporosis without current pathological fracture: Secondary | ICD-10-CM | POA: Diagnosis not present

## 2012-02-08 DIAGNOSIS — G609 Hereditary and idiopathic neuropathy, unspecified: Secondary | ICD-10-CM | POA: Diagnosis not present

## 2012-02-08 DIAGNOSIS — I10 Essential (primary) hypertension: Secondary | ICD-10-CM | POA: Diagnosis not present

## 2012-02-08 DIAGNOSIS — E039 Hypothyroidism, unspecified: Secondary | ICD-10-CM | POA: Diagnosis not present

## 2012-02-08 DIAGNOSIS — E785 Hyperlipidemia, unspecified: Secondary | ICD-10-CM | POA: Diagnosis not present

## 2012-02-08 DIAGNOSIS — K2100 Gastro-esophageal reflux disease with esophagitis, without bleeding: Secondary | ICD-10-CM | POA: Diagnosis not present

## 2012-02-21 DIAGNOSIS — D509 Iron deficiency anemia, unspecified: Secondary | ICD-10-CM | POA: Diagnosis not present

## 2012-02-21 DIAGNOSIS — E785 Hyperlipidemia, unspecified: Secondary | ICD-10-CM | POA: Diagnosis not present

## 2012-03-08 DIAGNOSIS — M19049 Primary osteoarthritis, unspecified hand: Secondary | ICD-10-CM | POA: Diagnosis not present

## 2012-03-13 DIAGNOSIS — G5600 Carpal tunnel syndrome, unspecified upper limb: Secondary | ICD-10-CM | POA: Diagnosis not present

## 2012-03-15 DIAGNOSIS — G5600 Carpal tunnel syndrome, unspecified upper limb: Secondary | ICD-10-CM | POA: Diagnosis not present

## 2012-03-21 ENCOUNTER — Other Ambulatory Visit: Payer: Self-pay | Admitting: Orthopaedic Surgery

## 2012-03-29 ENCOUNTER — Encounter (HOSPITAL_BASED_OUTPATIENT_CLINIC_OR_DEPARTMENT_OTHER): Payer: Self-pay | Admitting: *Deleted

## 2012-03-29 NOTE — Progress Notes (Signed)
Pt [place] near [place] cataract surgery last yr Denies any cardiology problems-no resp problems x she does use 02 at night-no sleep apnes-no cpap- Will use 02 when she gets home-does not need a portable during day.
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
## 2012-04-04 ENCOUNTER — Ambulatory Visit (HOSPITAL_BASED_OUTPATIENT_CLINIC_OR_DEPARTMENT_OTHER)
Admission: RE | Admit: 2012-04-04 | Discharge: 2012-04-04 | Disposition: A | Discharge status: Home / Self Care | Payer: Medicare Other | Source: Ambulatory Visit | Attending: Orthopaedic Surgery | Admitting: Orthopaedic Surgery

## 2012-04-04 ENCOUNTER — Encounter (HOSPITAL_BASED_OUTPATIENT_CLINIC_OR_DEPARTMENT_OTHER): Payer: Self-pay | Admitting: Anesthesiology

## 2012-04-04 ENCOUNTER — Encounter (HOSPITAL_BASED_OUTPATIENT_CLINIC_OR_DEPARTMENT_OTHER)
Admission: RE | Disposition: A | Discharge status: Home / Self Care | Payer: Self-pay | Source: Ambulatory Visit | Attending: Orthopaedic Surgery

## 2012-04-04 ENCOUNTER — Ambulatory Visit (HOSPITAL_BASED_OUTPATIENT_CLINIC_OR_DEPARTMENT_OTHER): Payer: Medicare Other | Admitting: Anesthesiology

## 2012-04-04 DIAGNOSIS — G4730 Sleep apnea, unspecified: Secondary | ICD-10-CM | POA: Diagnosis not present

## 2012-04-04 DIAGNOSIS — E119 Type 2 diabetes mellitus without complications: Secondary | ICD-10-CM | POA: Insufficient documentation

## 2012-04-04 DIAGNOSIS — I10 Essential (primary) hypertension: Secondary | ICD-10-CM | POA: Diagnosis not present

## 2012-04-04 DIAGNOSIS — E039 Hypothyroidism, unspecified: Secondary | ICD-10-CM | POA: Insufficient documentation

## 2012-04-04 DIAGNOSIS — F3289 Other specified depressive episodes: Secondary | ICD-10-CM | POA: Insufficient documentation

## 2012-04-04 DIAGNOSIS — M1710 Unilateral primary osteoarthritis, unspecified knee: Secondary | ICD-10-CM | POA: Diagnosis not present

## 2012-04-04 DIAGNOSIS — F329 Major depressive disorder, single episode, unspecified: Secondary | ICD-10-CM | POA: Diagnosis not present

## 2012-04-04 DIAGNOSIS — G5600 Carpal tunnel syndrome, unspecified upper limb: Secondary | ICD-10-CM | POA: Diagnosis not present

## 2012-04-04 DIAGNOSIS — K219 Gastro-esophageal reflux disease without esophagitis: Secondary | ICD-10-CM | POA: Insufficient documentation

## 2012-04-04 DIAGNOSIS — Z85828 Personal history of other malignant neoplasm of skin: Secondary | ICD-10-CM | POA: Insufficient documentation

## 2012-04-04 HISTORY — DX: Myoneural disorder, unspecified: G70.9

## 2012-04-04 HISTORY — PX: CARPAL TUNNEL RELEASE: SHX101

## 2012-04-04 LAB — POCT I-STAT, CHEM 8
BUN: 16 mg/dL (ref 6–23)
Calcium, Ion: 1.11 mmol/L — ABNORMAL LOW (ref 1.13–1.30)
Chloride: 99 mEq/L (ref 96–112)
Creatinine, Ser: 0.9 mg/dL (ref 0.50–1.10)
Glucose, Bld: 103 mg/dL — ABNORMAL HIGH (ref 70–99)
HCT: 44 % (ref 36.0–46.0)
Hemoglobin: 15 g/dL (ref 12.0–15.0)
Potassium: 3.5 mEq/L (ref 3.5–5.1)
Sodium: 138 mEq/L (ref 135–145)
TCO2: 32 mmol/L (ref 0–100)

## 2012-04-04 SURGERY — CARPAL TUNNEL RELEASE
Anesthesia: General | Site: Wrist | Laterality: Left | Wound class: Clean

## 2012-04-04 MED ORDER — DEXAMETHASONE SODIUM PHOSPHATE 4 MG/ML IJ SOLN
INTRAMUSCULAR | Status: DC | PRN
  Administered 2012-04-04: 10 mg via INTRAVENOUS

## 2012-04-04 MED ORDER — LACTATED RINGERS IV SOLN
INTRAVENOUS | Status: DC
  Administered 2012-04-04 (×2): via INTRAVENOUS

## 2012-04-04 MED ORDER — LIDOCAINE HCL (CARDIAC) 20 MG/ML IV SOLN
INTRAVENOUS | Status: DC | PRN
  Administered 2012-04-04: 50 mg via INTRAVENOUS

## 2012-04-04 MED ORDER — FENTANYL CITRATE 0.05 MG/ML IJ SOLN: 25.0000 ug | INTRAMUSCULAR | Status: DC | PRN

## 2012-04-04 MED ORDER — LACTATED RINGERS IV SOLN: INTRAVENOUS | Status: DC

## 2012-04-04 MED ORDER — BUPIVACAINE HCL (PF) 0.25 % IJ SOLN
INTRAMUSCULAR | Status: DC | PRN
  Administered 2012-04-04: 10 mL

## 2012-04-04 MED ORDER — PROPOFOL 10 MG/ML IV BOLUS
INTRAVENOUS | Status: DC | PRN
  Administered 2012-04-04 (×2): 100 mg via INTRAVENOUS

## 2012-04-04 MED ORDER — HYDROCODONE-ACETAMINOPHEN 5-325 MG PO TABS
1.0000 | ORAL_TABLET | Freq: Once | ORAL | Status: AC | PRN
  Administered 2012-04-04: 1 via ORAL

## 2012-04-04 MED ORDER — MIDAZOLAM HCL 2 MG/2ML IJ SOLN: 1.0000 mg | INTRAMUSCULAR | Status: DC | PRN

## 2012-04-04 MED ORDER — FENTANYL CITRATE 0.05 MG/ML IJ SOLN: 50.0000 ug | INTRAMUSCULAR | Status: DC | PRN

## 2012-04-04 MED ORDER — HYDROCODONE-ACETAMINOPHEN 5-325 MG PO TABS: 1.0000 | ORAL_TABLET | Freq: Four times a day (QID) | ORAL | Status: DC | PRN

## 2012-04-04 MED ORDER — VANCOMYCIN HCL IN DEXTROSE 1-5 GM/200ML-% IV SOLN: 1000.0000 mg | INTRAVENOUS | Status: DC

## 2012-04-04 MED ORDER — PROPOFOL INFUSION 10 MG/ML OPTIME
INTRAVENOUS | Status: DC | PRN
  Administered 2012-04-04: 120 ug/kg/min via INTRAVENOUS

## 2012-04-04 MED ORDER — ONDANSETRON HCL 4 MG/2ML IJ SOLN
INTRAMUSCULAR | Status: DC | PRN
  Administered 2012-04-04: 4 mg via INTRAVENOUS

## 2012-04-04 MED ORDER — FENTANYL CITRATE 0.05 MG/ML IJ SOLN
INTRAMUSCULAR | Status: DC | PRN
  Administered 2012-04-04 (×2): 50 ug via INTRAVENOUS

## 2012-04-04 MED ORDER — CHLORHEXIDINE GLUCONATE 4 % EX LIQD: 60.0000 mL | Freq: Once | CUTANEOUS | Status: DC

## 2012-04-04 SURGICAL SUPPLY — 53 items
BANDAGE ELASTIC 4 VELCRO ST LF (GAUZE/BANDAGES/DRESSINGS) ×2 IMPLANT
BANDAGE GAUZE ELAST BULKY 4 IN (GAUZE/BANDAGES/DRESSINGS) ×2 IMPLANT
BLADE MINI RND TIP GREEN BEAV (BLADE) ×2 IMPLANT
BLADE SURG 15 STRL LF DISP TIS (BLADE) ×1 IMPLANT
BLADE SURG 15 STRL SS (BLADE) ×2
BNDG CMPR 9X4 STRL LF SNTH (GAUZE/BANDAGES/DRESSINGS) ×1
BNDG ESMARK 4X9 LF (GAUZE/BANDAGES/DRESSINGS) ×1 IMPLANT
COVER MAYO STAND STRL (DRAPES) ×2 IMPLANT
COVER TABLE BACK 60X90 (DRAPES) ×2 IMPLANT
CUFF TOURNIQUET SINGLE 18IN (TOURNIQUET CUFF) ×1 IMPLANT
DRAPE EXTREMITY T 121X128X90 (DRAPE) ×2 IMPLANT
DRAPE SURG 17X23 STRL (DRAPES) ×2 IMPLANT
DRSG EMULSION OIL 3X3 NADH (GAUZE/BANDAGES/DRESSINGS) ×2 IMPLANT
DRSG PAD ABDOMINAL 8X10 ST (GAUZE/BANDAGES/DRESSINGS) IMPLANT
DURAPREP 26ML APPLICATOR (WOUND CARE) ×2 IMPLANT
ELECT NDL TIP 2.8 STRL (NEEDLE) IMPLANT
ELECT NEEDLE TIP 2.8 STRL (NEEDLE) IMPLANT
ELECT REM PT RETURN 9FT ADLT (ELECTROSURGICAL) ×2
ELECTRODE REM PT RTRN 9FT ADLT (ELECTROSURGICAL) ×1 IMPLANT
GLOVE BIO SURGEON STRL SZ 6.5 (GLOVE) ×1 IMPLANT
GLOVE BIO SURGEON STRL SZ8.5 (GLOVE) ×2 IMPLANT
GLOVE BIOGEL PI IND STRL 8 (GLOVE) ×1 IMPLANT
GLOVE BIOGEL PI IND STRL 8.5 (GLOVE) ×1 IMPLANT
GLOVE BIOGEL PI INDICATOR 8 (GLOVE) ×1
GLOVE BIOGEL PI INDICATOR 8.5 (GLOVE) ×1
GLOVE ECLIPSE 6.5 STRL STRAW (GLOVE) ×1 IMPLANT
GLOVE INDICATOR 7.0 STRL GRN (GLOVE) ×2 IMPLANT
GLOVE SS BIOGEL STRL SZ 8 (GLOVE) ×1 IMPLANT
GLOVE SUPERSENSE BIOGEL SZ 8 (GLOVE) ×1
GOWN PREVENTION PLUS XLARGE (GOWN DISPOSABLE) ×5 IMPLANT
LOOP VESSEL MAXI BLUE (MISCELLANEOUS) IMPLANT
NDL HYPO 25X1 1.5 SAFETY (NEEDLE) IMPLANT
NEEDLE HYPO 25X1 1.5 SAFETY (NEEDLE) IMPLANT
NS IRRIG 1000ML POUR BTL (IV SOLUTION) ×2 IMPLANT
PACK BASIN DAY SURGERY FS (CUSTOM PROCEDURE TRAY) ×2 IMPLANT
PADDING CAST ABS 3INX4YD NS (CAST SUPPLIES) ×1
PADDING CAST ABS 4INX4YD NS (CAST SUPPLIES)
PADDING CAST ABS COTTON 3X4 (CAST SUPPLIES) ×1 IMPLANT
PADDING CAST ABS COTTON 4X4 ST (CAST SUPPLIES) IMPLANT
PENCIL BUTTON HOLSTER BLD 10FT (ELECTRODE) ×1 IMPLANT
SPLINT PLASTER CAST XFAST 3X15 (CAST SUPPLIES) ×5 IMPLANT
SPLINT PLASTER XTRA FASTSET 3X (CAST SUPPLIES) ×5
SPONGE GAUZE 4X4 12PLY (GAUZE/BANDAGES/DRESSINGS) ×2 IMPLANT
STOCKINETTE 4X48 STRL (DRAPES) ×2 IMPLANT
SUT ETHILON 4 0 PS 2 18 (SUTURE) ×2 IMPLANT
SUT VIC AB 4-0 P-3 18XBRD (SUTURE) IMPLANT
SUT VIC AB 4-0 P3 18 (SUTURE)
SYR BULB 3OZ (MISCELLANEOUS) ×1 IMPLANT
SYR CONTROL 10ML LL (SYRINGE) ×1 IMPLANT
TOWEL OR 17X24 6PK STRL BLUE (TOWEL DISPOSABLE) ×2 IMPLANT
TOWEL OR NON WOVEN STRL DISP B (DISPOSABLE) ×2 IMPLANT
UNDERPAD 30X30 INCONTINENT (UNDERPADS AND DIAPERS) ×2 IMPLANT
WATER STERILE IRR 1000ML POUR (IV SOLUTION) ×2 IMPLANT

## 2012-04-04 NOTE — Op Note (Signed)
PRE-OP DIAGNOSIS:  left carpal tunnel syndrome POST-OP DIAGNOSIS:  left carpal tunnel syndrome PROCEDURE:  left carpal tunnel release ANESTHESIA:  general ATTENDING SURGEON:  Marcene Corning MD ASSISTANT:  Lindwood Qua PA  INDICATION FOR PROCEDURE:  Abigail Edwards is a 78 y.o. female with a long history of hand pain and numbness.  The patient has failed conservative measures of treatment and persists with symptoms which are very limiting.  The patient is offered carpal tunnel release in hopes of improving their situation by relieving pressure on the medial nerve.  Informed operative consent was obtained after discussion of possible complications including reaction to anesthesia, infection, and neurovascular injury.  SUMMARY OF FINDINGS AND PROCEDURE:  Under to above anesthetic a carpal tunnel release was performed.  The patient had a moderately thickened transverse carpal tunnel ligament which was applying compression to the underlying medial nerve.  This was release under direct visualization.  The skin was closed primarily followed by placement of a sterile dressing and splint with the wrist in slight extension.  DESCRIPTION OF PROCEDURE:  [name] was taken to an operative suite where the above anesthetic was applied.  The patient was then prepped and draped in normal sterile fashion.  An appropriate time out was performed.  After the administration of kefzol pre-operative antibiotic and application and inflation of a tourniquet a small incision was made on the palmar aspect of the palm.  This did not cross the wrist flexion crease and was ulnar to the thenar flexion crease.  Dissection was carried down through palmar fascia to expose the transverse carpal ligament which was released under direct visualization.  The dissection was taken distally towards the transverse arch of vessels and distally through the distal fascia of the forearm.  The wound was irrigated and the skin was closed with  nylon.  We injected some plain marcaine along the incision. The tourniquet was deflated and skin edges became pink immediately and pulses were intact.  Adaptic was applied along with a sterile dressing and a plaster splint with the wrist in slight extension.  Estimated blood loss and intraoperative fluids can be obtained from anesthesia records as can tourniquet time.  DISPOSITION:  [name] was taken to recovery room in stable condition.  Plans were to go home same day and I will contact the patient by phone tonight.  The patient will follow-up in about a week in the office.  Cathline Dowen G 04/04/2012, 9:56 AM

## 2012-04-04 NOTE — Anesthesia Preprocedure Evaluation (Signed)
Anesthesia Evaluation  Patient identified by MRN, date of birth, ID band Patient awake    Reviewed: Allergy & Precautions, H&P , NPO status , Patient's Chart, lab work & pertinent test results  Airway Mallampati: II TM Distance: >3 FB Neck ROM: Full    Dental no notable dental hx. (+) Teeth Intact and Dental Advisory Given   Pulmonary sleep apnea ,  breath sounds clear to auscultation  Pulmonary exam normal       Cardiovascular hypertension, negative cardio ROS  Rhythm:Regular Rate:Normal     Neuro/Psych PSYCHIATRIC DISORDERS negative neurological ROS     GI/Hepatic Neg liver ROS, GERD-  ,  Endo/Other  diabetes, Type 2, Oral Hypoglycemic AgentsHypothyroidism   Renal/GU negative Renal ROS  negative genitourinary   Musculoskeletal   Abdominal   Peds  Hematology negative hematology ROS (+)   Anesthesia Other Findings   Reproductive/Obstetrics negative OB ROS                           Anesthesia Physical Anesthesia Plan  ASA: III  Anesthesia Plan: General   Post-op Pain Management:    Induction: Intravenous  Airway Management Planned: LMA  Additional Equipment:   Intra-op Plan:   Post-operative Plan: Extubation in OR  Informed Consent: I have reviewed the patients History and Physical, chart, labs and discussed the procedure including the risks, benefits and alternatives for the proposed anesthesia with the patient or authorized representative who has indicated his/her understanding and acceptance.   Dental advisory given  Plan Discussed with: CRNA  Anesthesia Plan Comments:         Anesthesia Quick Evaluation

## 2012-04-04 NOTE — Anesthesia Postprocedure Evaluation (Signed)
  Anesthesia Post-op Note  Patient: Abigail Edwards  Procedure(s) Performed: Procedure(s): CARPAL TUNNEL RELEASE (Left)  Patient Location: PACU  Anesthesia Type:General  Level of Consciousness: awake and alert   Airway and Oxygen Therapy: Patient Spontanous Breathing  Post-op Pain: none  Post-op Assessment: Post-op Vital signs reviewed, Patient's Cardiovascular Status Stable, Respiratory Function Stable, Patent Airway and No signs of Nausea or vomiting  Post-op Vital Signs: Reviewed and stable  Complications: No apparent anesthesia complications

## 2012-04-04 NOTE — Anesthesia Procedure Notes (Signed)
Procedure Name: LMA Insertion Date/Time: 04/04/2012 9:34 AM Performed by: Caren Macadam Pre-anesthesia Checklist: Patient identified, Emergency Drugs available, Suction available and Patient being monitored Patient Re-evaluated:Patient Re-evaluated prior to inductionOxygen Delivery Method: Circle System Utilized Preoxygenation: Pre-oxygenation with 100% oxygen Intubation Type: IV induction Ventilation: Mask ventilation without difficulty LMA: LMA inserted LMA Size: 4.0 Number of attempts: 1 Airway Equipment and Method: bite block Placement Confirmation: positive ETCO2 and breath sounds checked- equal and bilateral Tube secured with: Tape Dental Injury: Teeth and Oropharynx as per pre-operative assessment

## 2012-04-04 NOTE — Transfer of Care (Signed)
Immediate Anesthesia Transfer of Care Note  Patient: Abigail Edwards  Procedure(s) Performed: Procedure(s): CARPAL TUNNEL RELEASE (Left)  Patient Location: PACU  Anesthesia Type:General  Level of Consciousness: awake and alert   Airway & Oxygen Therapy: Patient Spontanous Breathing and Patient connected to face mask oxygen  Post-op Assessment: Report given to PACU RN and Post -op Vital signs reviewed and stable  Post vital signs: Reviewed and stable  Complications: No apparent anesthesia complications

## 2012-04-04 NOTE — Interval H&P Note (Signed)
History and Physical Interval Note:  04/04/2012 8:49 AM  Abigail Edwards  has presented today for surgery, with the diagnosis of Left carpal tunnel syndrome  The various methods of treatment have been discussed with the patient and family. After consideration of risks, benefits and other options for treatment, the patient has consented to  Procedure(s): CARPAL TUNNEL RELEASE (Left) as a surgical intervention .  The patient's history has been reviewed, patient examined, no change in status, stable for surgery.  I have reviewed the patient's chart and labs.  Questions were answered to the patient's satisfaction.     Amberlin Utke G

## 2012-04-05 ENCOUNTER — Encounter (HOSPITAL_BASED_OUTPATIENT_CLINIC_OR_DEPARTMENT_OTHER): Payer: Self-pay | Admitting: Orthopaedic Surgery

## 2012-04-10 DIAGNOSIS — L851 Acquired keratosis [keratoderma] palmaris et plantaris: Secondary | ICD-10-CM | POA: Diagnosis not present

## 2012-04-10 DIAGNOSIS — B351 Tinea unguium: Secondary | ICD-10-CM | POA: Diagnosis not present

## 2012-04-10 DIAGNOSIS — E1159 Type 2 diabetes mellitus with other circulatory complications: Secondary | ICD-10-CM | POA: Diagnosis not present

## 2012-04-12 ENCOUNTER — Other Ambulatory Visit: Payer: Self-pay | Admitting: Internal Medicine

## 2012-04-12 DIAGNOSIS — Z1231 Encounter for screening mammogram for malignant neoplasm of breast: Secondary | ICD-10-CM

## 2012-05-10 ENCOUNTER — Ambulatory Visit
Admission: RE | Admit: 2012-05-10 | Discharge: 2012-05-10 | Disposition: A | Discharge status: Home / Self Care | Payer: Medicare Other | Source: Ambulatory Visit | Attending: Internal Medicine | Admitting: Internal Medicine

## 2012-05-10 ENCOUNTER — Ambulatory Visit (INDEPENDENT_AMBULATORY_CARE_PROVIDER_SITE_OTHER): Payer: Medicare Other | Admitting: Internal Medicine

## 2012-05-10 ENCOUNTER — Encounter: Payer: Self-pay | Admitting: Internal Medicine

## 2012-05-10 VITALS — BP 150/84 | HR 66 | Temp 98.6°F | Resp 16 | Ht 63.0 in | Wt 187.4 lb

## 2012-05-10 DIAGNOSIS — E785 Hyperlipidemia, unspecified: Secondary | ICD-10-CM | POA: Insufficient documentation

## 2012-05-10 DIAGNOSIS — E039 Hypothyroidism, unspecified: Secondary | ICD-10-CM

## 2012-05-10 DIAGNOSIS — E1149 Type 2 diabetes mellitus with other diabetic neurological complication: Secondary | ICD-10-CM

## 2012-05-10 DIAGNOSIS — E1142 Type 2 diabetes mellitus with diabetic polyneuropathy: Secondary | ICD-10-CM | POA: Diagnosis not present

## 2012-05-10 DIAGNOSIS — E876 Hypokalemia: Secondary | ICD-10-CM | POA: Diagnosis not present

## 2012-05-10 DIAGNOSIS — F329 Major depressive disorder, single episode, unspecified: Secondary | ICD-10-CM

## 2012-05-10 DIAGNOSIS — Z1231 Encounter for screening mammogram for malignant neoplasm of breast: Secondary | ICD-10-CM | POA: Diagnosis not present

## 2012-05-10 DIAGNOSIS — I10 Essential (primary) hypertension: Secondary | ICD-10-CM

## 2012-05-10 DIAGNOSIS — F32A Depression, unspecified: Secondary | ICD-10-CM | POA: Insufficient documentation

## 2012-05-10 NOTE — Assessment & Plan Note (Signed)
ldl currently at goal. On crestor 20 mg daily. Careful with diet. Check flp in 3 months and if ldl remains at goal, consider lowering crestor

## 2012-05-10 NOTE — Progress Notes (Signed)
  Subjective:    Patient ID: Abigail Edwards, female    DOB: 09/19/1934, 78 y.o.   MRN: [id number]  HPI  She underwent surgery for her carpel tunnel syndrome on 2/11/1 and since then her pain has resolved. She has some numbness in the middle finger and index finger but slowl resolving. Her arthritis pain is also under better control. Continues to take hydrocodone-apap 1-2 times a day for now. No further leg cramps.  Has symptoms of sleepiness and lightheadedness in afternoon at times and feels better after taking snack and sweets. No presyncopal- syncopal episodes reported Compliant with her medications  Review of Systems  Constitutional: Negative.   HENT: Negative.   Respiratory: Negative.   Cardiovascular: Negative for chest pain, palpitations and leg swelling.  Gastrointestinal: Negative for abdominal pain.  Endocrine:       See hpi  Genitourinary: Negative.   Musculoskeletal: Positive for arthralgias.  Neurological: Positive for light-headedness and numbness.       See hpi  Psychiatric/Behavioral: Negative for behavioral problems and sleep disturbance.       Objective:   Physical Exam  Constitutional: She is oriented to person, place, and time. She appears well-developed.  HENT:  Head: Normocephalic and atraumatic.  Eyes: Pupils are equal, round, and reactive to light.  Neck: Normal range of motion. Neck supple.  Cardiovascular: Normal rate and regular rhythm.   Pulmonary/Chest: Effort normal and breath sounds normal.  Abdominal: Soft. Bowel sounds are normal.  Musculoskeletal: Normal range of motion.  Neurological: She is alert and oriented to person, place, and time.  Skin: Skin is warm and dry.  Psychiatric: She has a normal mood and affect.          Assessment & Plan:

## 2012-05-10 NOTE — Assessment & Plan Note (Signed)
On triamterene-hctz and has had leg cramps in past. No further leg cramps. k normal. Continue kcl supplement

## 2012-05-10 NOTE — Assessment & Plan Note (Signed)
Citalopram at current dose has been helpful. No behavioral changes reported and energy level has been good. Monitor for now

## 2012-05-10 NOTE — Assessment & Plan Note (Signed)
On levothyroxine 75 mcg daily, asymptomatic. tsh 4.260 in 12/13. Will check tsh today. Has regular bowel movemet

## 2012-05-10 NOTE — Assessment & Plan Note (Addendum)
a1c 6.2 in 12/13. On metformin 500 mg daily.mentions about ?hypoglycemic episodes in the afternoon with lightheadedness and sleepiness nd after she eats food, feels better. With a1c < 6.5 and concerns for hypoglycemic episodes will stop metformin for now and monitor a1c periodically. On gabapentin 100 mg tid and nerve pain under control. Continue this and statin

## 2012-05-10 NOTE — Assessment & Plan Note (Signed)
sbp elevated this am in office. Has not taken her medication this am. Has not checked bp at home. On triamterne-hctz 37.5-25 once a day. Restricts salt intake

## 2012-05-10 NOTE — Patient Instructions (Signed)
Will see you in   3   Months for follow up You will need tsh, flp and a1c checked prior to next visit Continue being careful with your diet

## 2012-05-26 ENCOUNTER — Other Ambulatory Visit: Payer: Self-pay | Admitting: Internal Medicine

## 2012-05-26 ENCOUNTER — Other Ambulatory Visit: Payer: Self-pay | Admitting: *Deleted

## 2012-06-08 DIAGNOSIS — R079 Chest pain, unspecified: Secondary | ICD-10-CM | POA: Diagnosis not present

## 2012-07-05 DIAGNOSIS — L851 Acquired keratosis [keratoderma] palmaris et plantaris: Secondary | ICD-10-CM | POA: Diagnosis not present

## 2012-07-05 DIAGNOSIS — B351 Tinea unguium: Secondary | ICD-10-CM | POA: Diagnosis not present

## 2012-07-05 DIAGNOSIS — E1149 Type 2 diabetes mellitus with other diabetic neurological complication: Secondary | ICD-10-CM | POA: Diagnosis not present

## 2012-07-27 ENCOUNTER — Other Ambulatory Visit: Payer: Self-pay | Admitting: Internal Medicine

## 2012-08-16 ENCOUNTER — Encounter: Payer: Self-pay | Admitting: Internal Medicine

## 2012-08-16 ENCOUNTER — Ambulatory Visit (INDEPENDENT_AMBULATORY_CARE_PROVIDER_SITE_OTHER): Payer: Medicare Other | Admitting: Internal Medicine

## 2012-08-16 VITALS — BP 142/68 | HR 72 | Temp 97.3°F | Resp 18 | Ht 63.5 in | Wt 191.2 lb

## 2012-08-16 DIAGNOSIS — E039 Hypothyroidism, unspecified: Secondary | ICD-10-CM

## 2012-08-16 DIAGNOSIS — E1149 Type 2 diabetes mellitus with other diabetic neurological complication: Secondary | ICD-10-CM | POA: Diagnosis not present

## 2012-08-16 DIAGNOSIS — M1990 Unspecified osteoarthritis, unspecified site: Secondary | ICD-10-CM

## 2012-08-16 DIAGNOSIS — E785 Hyperlipidemia, unspecified: Secondary | ICD-10-CM | POA: Diagnosis not present

## 2012-08-16 DIAGNOSIS — I10 Essential (primary) hypertension: Secondary | ICD-10-CM

## 2012-08-16 DIAGNOSIS — G609 Hereditary and idiopathic neuropathy, unspecified: Secondary | ICD-10-CM

## 2012-08-16 DIAGNOSIS — F329 Major depressive disorder, single episode, unspecified: Secondary | ICD-10-CM

## 2012-08-16 DIAGNOSIS — F32A Depression, unspecified: Secondary | ICD-10-CM

## 2012-08-16 DIAGNOSIS — E1142 Type 2 diabetes mellitus with diabetic polyneuropathy: Secondary | ICD-10-CM

## 2012-08-16 DIAGNOSIS — G629 Polyneuropathy, unspecified: Secondary | ICD-10-CM | POA: Insufficient documentation

## 2012-08-16 NOTE — Progress Notes (Signed)
Patient ID: Abigail Edwards, female   DOB: 1934/07/23, 78 y.o.   MRN: [id number]  Chief Complaint  Patient presents with  . Medical Managment of Chronic Issues    OA pain, but other than that fine    HPI-  OA and chronic back pain- Right hand and left thumb joint have been hurting since this am. Denies any redness or swelling. She feels her arthritis is acting up. Continue norco prn, symptoms under control  Vit d def- taking her vit d 5000 u daily  Depression- mood stable, celexa has been helpful, no side effects reported  Hypothyroidism- taking levothyroxine 75 mcg daily, energy level has been good, tolerating medication well  gerd- reflux symptoms under control, taking ranitidine  HTN- sbp mildy elevated, taking maxzide, has not taken her bp meds this am, using sea salt in moderation, walking her dog for exercise, denies chest pain or SOB. Also on statin  Peripheral neuropathy- controlled with gabapentin   Review of Systems  Constitutional: Negative for fever, chills, malaise/fatigue and diaphoresis.  HENT: Negative for congestion.   Eyes: Negative for blurred vision.  Respiratory: Negative for cough and shortness of breath.   Cardiovascular: Negative for chest pain, palpitations and leg swelling.  Gastrointestinal: Negative for heartburn and abdominal pain.  Genitourinary: Negative for dysuria.  Musculoskeletal: Positive for joint pain.  Skin: Negative for rash.  Neurological: Negative for dizziness and headaches.  Psychiatric/Behavioral: Negative for depression and memory loss. The patient is not nervous/anxious and does not have insomnia.    Allergies  Allergen Reactions  . Valium (Diazepam)     MENTAL STATUS CHANGES  . Morphine And Related Other (See Comments)    Pt "went out of her mind"  . Penicillins Rash   EXAM-  BP 142/68  Pulse 72  Temp(Src) 97.3 F (36.3 C)  Resp 18  Ht 5' 3.5" (1.613 m)  Wt 191 lb 3.2 oz (86.728 kg)  BMI 33.33 kg/m2  Constitutional:  She is oriented to person, place, and time. She appears well-developed.  HENT:   Head: Normocephalic and atraumatic.  Eyes: Pupils are equal, round, and reactive to light.  Neck: Normal range of motion. Neck supple.  Cardiovascular: Normal rate and regular rhythm.   Pulmonary/Chest: Effort normal and breath sounds normal.  Abdominal: Soft. Bowel sounds are normal.  Musculoskeletal: Normal range of motion.  Neurological: She is alert and oriented to person, place, and time.  Skin: Skin is warm and dry.  Psychiatric: She has a normal mood and affect.   Imaging- 2/14- mammogram- normal  ASSESSMENT/PLAN-  HTN- continue current bp medications. Check bmp today.   DM- diet controlled at present. Continue statin, bp well controlled. Check flp and a1c  Depression- continue celexa for now and monitor  Peripheral neuropathy- continue gabapentin for now and monitor  gerd- continue ranitidine for now, monitor   Hypothyroidism- continue levothyroxine for now, check tsh  OA- persists with occassional flare. Continue norco current dose, has been helpful. To use prn heat pack

## 2012-08-16 NOTE — Addendum Note (Signed)
Addended by: Marvia Pickles on: 08/16/2012 10:14 AM   Modules accepted: Orders

## 2012-08-17 LAB — LIPID PANEL
Chol/HDL Ratio: 4.9 ratio units — ABNORMAL HIGH (ref 0.0–4.4)
Cholesterol, Total: 254 mg/dL — ABNORMAL HIGH (ref 100–199)
HDL: 52 mg/dL (ref >39)
LDL Calculated: 145 mg/dL — ABNORMAL HIGH (ref 0–99)
Triglycerides: 287 mg/dL — ABNORMAL HIGH (ref 0–149)
VLDL Cholesterol Cal: 57 mg/dL — ABNORMAL HIGH (ref 5–40)

## 2012-08-17 LAB — BASIC METABOLIC PANEL
BUN/Creatinine Ratio: 14 (ref 11–26)
BUN: 11 mg/dL (ref 8–27)
CO2: 30 mmol/L — ABNORMAL HIGH (ref 18–29)
Calcium: 9.5 mg/dL (ref 8.6–10.2)
Chloride: 96 mmol/L — ABNORMAL LOW (ref 97–108)
Creatinine, Ser: 0.81 mg/dL (ref 0.57–1.00)
GFR calc Af Amer: 82 mL/min/{1.73_m2} (ref >59)
GFR calc non Af Amer: 71 mL/min/{1.73_m2} (ref >59)
Glucose: 89 mg/dL (ref 65–99)
Potassium: 4.7 mmol/L (ref 3.5–5.2)
Sodium: 142 mmol/L (ref 134–144)

## 2012-08-17 LAB — TSH: TSH: 7.27 u[IU]/mL — ABNORMAL HIGH (ref 0.450–4.500)

## 2012-08-17 LAB — HEMOGLOBIN A1C
Est. average glucose Bld gHb Est-mCnc: 128 mg/dL
Hgb A1c MFr Bld: 6.1 % — ABNORMAL HIGH (ref 4.8–5.6)

## 2012-08-18 ENCOUNTER — Other Ambulatory Visit: Payer: Self-pay | Admitting: *Deleted

## 2012-08-18 DIAGNOSIS — E039 Hypothyroidism, unspecified: Secondary | ICD-10-CM

## 2012-08-18 MED ORDER — ROSUVASTATIN CALCIUM 40 MG PO TABS: ORAL_TABLET | ORAL | Status: DC

## 2012-08-18 MED ORDER — LEVOTHYROXINE SODIUM 125 MCG PO TABS: ORAL_TABLET | ORAL | Status: DC

## 2012-09-15 ENCOUNTER — Other Ambulatory Visit: Payer: Self-pay | Admitting: Geriatric Medicine

## 2012-09-15 ENCOUNTER — Other Ambulatory Visit: Payer: Self-pay | Admitting: Internal Medicine

## 2012-09-15 ENCOUNTER — Other Ambulatory Visit: Payer: Medicare Other

## 2012-09-15 DIAGNOSIS — I10 Essential (primary) hypertension: Secondary | ICD-10-CM | POA: Diagnosis not present

## 2012-09-15 DIAGNOSIS — E785 Hyperlipidemia, unspecified: Secondary | ICD-10-CM | POA: Diagnosis not present

## 2012-09-15 DIAGNOSIS — E039 Hypothyroidism, unspecified: Secondary | ICD-10-CM | POA: Diagnosis not present

## 2012-09-15 DIAGNOSIS — IMO0001 Reserved for inherently not codable concepts without codable children: Secondary | ICD-10-CM | POA: Diagnosis not present

## 2012-09-15 MED ORDER — HYDROCODONE-ACETAMINOPHEN 5-325 MG PO TABS: 1.0000 | ORAL_TABLET | Freq: Four times a day (QID) | ORAL | Status: DC | PRN

## 2012-09-18 ENCOUNTER — Other Ambulatory Visit: Payer: Self-pay | Admitting: Internal Medicine

## 2012-09-18 ENCOUNTER — Other Ambulatory Visit: Payer: Self-pay | Admitting: Geriatric Medicine

## 2012-09-18 MED ORDER — HYDROCODONE-ACETAMINOPHEN 10-325 MG PO TABS: ORAL_TABLET | ORAL | Status: DC

## 2012-09-22 LAB — LIPID PANEL W/O CHOL/HDL RATIO
Cholesterol, Total: 165 mg/dL (ref 100–199)
HDL: 47 mg/dL (ref >39)
LDL Calculated: 64 mg/dL (ref 0–99)
Triglycerides: 270 mg/dL — ABNORMAL HIGH (ref 0–149)
VLDL Cholesterol Cal: 54 mg/dL — ABNORMAL HIGH (ref 5–40)

## 2012-09-22 LAB — HGB A1C W/O EAG: Hgb A1c MFr Bld: 6.4 % — ABNORMAL HIGH (ref 4.8–5.6)

## 2012-09-22 LAB — TSH: TSH: 3.38 u[IU]/mL (ref 0.450–4.500)

## 2012-10-04 DIAGNOSIS — L851 Acquired keratosis [keratoderma] palmaris et plantaris: Secondary | ICD-10-CM | POA: Diagnosis not present

## 2012-10-04 DIAGNOSIS — E1149 Type 2 diabetes mellitus with other diabetic neurological complication: Secondary | ICD-10-CM | POA: Diagnosis not present

## 2012-10-04 DIAGNOSIS — B351 Tinea unguium: Secondary | ICD-10-CM | POA: Diagnosis not present

## 2012-10-18 ENCOUNTER — Other Ambulatory Visit: Payer: Self-pay | Admitting: Internal Medicine

## 2012-11-23 ENCOUNTER — Other Ambulatory Visit: Payer: Self-pay | Admitting: *Deleted

## 2012-12-01 ENCOUNTER — Encounter: Payer: Self-pay | Admitting: Internal Medicine

## 2012-12-13 ENCOUNTER — Encounter: Payer: Self-pay | Admitting: Internal Medicine

## 2012-12-13 ENCOUNTER — Ambulatory Visit (INDEPENDENT_AMBULATORY_CARE_PROVIDER_SITE_OTHER): Payer: Medicare Other | Admitting: Internal Medicine

## 2012-12-13 VITALS — BP 140/72 | HR 77 | Wt 186.0 lb

## 2012-12-13 DIAGNOSIS — I10 Essential (primary) hypertension: Secondary | ICD-10-CM

## 2012-12-13 DIAGNOSIS — F32A Depression, unspecified: Secondary | ICD-10-CM

## 2012-12-13 DIAGNOSIS — G609 Hereditary and idiopathic neuropathy, unspecified: Secondary | ICD-10-CM

## 2012-12-13 DIAGNOSIS — E1149 Type 2 diabetes mellitus with other diabetic neurological complication: Secondary | ICD-10-CM | POA: Diagnosis not present

## 2012-12-13 DIAGNOSIS — F329 Major depressive disorder, single episode, unspecified: Secondary | ICD-10-CM

## 2012-12-13 DIAGNOSIS — E785 Hyperlipidemia, unspecified: Secondary | ICD-10-CM

## 2012-12-13 DIAGNOSIS — E1142 Type 2 diabetes mellitus with diabetic polyneuropathy: Secondary | ICD-10-CM | POA: Diagnosis not present

## 2012-12-13 DIAGNOSIS — E876 Hypokalemia: Secondary | ICD-10-CM

## 2012-12-13 DIAGNOSIS — M1990 Unspecified osteoarthritis, unspecified site: Secondary | ICD-10-CM | POA: Diagnosis not present

## 2012-12-13 DIAGNOSIS — G629 Polyneuropathy, unspecified: Secondary | ICD-10-CM

## 2012-12-13 DIAGNOSIS — E039 Hypothyroidism, unspecified: Secondary | ICD-10-CM

## 2012-12-13 MED ORDER — RANITIDINE HCL 150 MG PO TABS: ORAL_TABLET | ORAL | Status: DC

## 2012-12-13 MED ORDER — POTASSIUM CHLORIDE ER 10 MEQ PO TBCR: 10.0000 meq | EXTENDED_RELEASE_TABLET | Freq: Two times a day (BID) | ORAL | Status: DC

## 2012-12-13 MED ORDER — LEVOTHYROXINE SODIUM 125 MCG PO TABS: ORAL_TABLET | ORAL | Status: DC

## 2012-12-13 MED ORDER — ROSUVASTATIN CALCIUM 40 MG PO TABS: ORAL_TABLET | ORAL | Status: DC
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
## 2012-12-14 LAB — COMPREHENSIVE METABOLIC PANEL
ALT: 8 IU/L (ref 0–32)
AST: 15 IU/L (ref 0–40)
Albumin/Globulin Ratio: 1.6 (ref 1.1–2.5)
Albumin: 4.3 g/dL (ref 3.5–4.8)
Alkaline Phosphatase: 93 IU/L (ref 39–117)
BUN/Creatinine Ratio: 17 (ref 11–26)
BUN: 12 mg/dL (ref 8–27)
CO2: 28 mmol/L (ref 18–29)
Calcium: 9.4 mg/dL (ref 8.6–10.2)
Chloride: 93 mmol/L — ABNORMAL LOW (ref 97–108)
Creatinine, Ser: 0.72 mg/dL (ref 0.57–1.00)
GFR calc Af Amer: 94 mL/min/{1.73_m2} (ref >59)
GFR calc non Af Amer: 82 mL/min/{1.73_m2} (ref >59)
Globulin, Total: 2.7 g/dL (ref 1.5–4.5)
Glucose: 90 mg/dL (ref 65–99)
Potassium: 3.8 mmol/L (ref 3.5–5.2)
Sodium: 138 mmol/L (ref 134–144)
Total Bilirubin: 0.3 mg/dL (ref 0.0–1.2)
Total Protein: 7 g/dL (ref 6.0–8.5)

## 2012-12-14 LAB — TSH: TSH: 3.16 u[IU]/mL (ref 0.450–4.500)

## 2012-12-15 ENCOUNTER — Other Ambulatory Visit: Payer: Self-pay | Admitting: Internal Medicine

## 2012-12-15 ENCOUNTER — Encounter: Payer: Self-pay | Admitting: *Deleted

## 2012-12-18 ENCOUNTER — Other Ambulatory Visit: Payer: Self-pay | Admitting: *Deleted

## 2012-12-18 MED ORDER — HYDROCODONE-ACETAMINOPHEN 10-325 MG PO TABS: ORAL_TABLET | ORAL | Status: DC

## 2013-01-10 DIAGNOSIS — L851 Acquired keratosis [keratoderma] palmaris et plantaris: Secondary | ICD-10-CM | POA: Diagnosis not present

## 2013-01-10 DIAGNOSIS — B351 Tinea unguium: Secondary | ICD-10-CM | POA: Diagnosis not present

## 2013-01-10 DIAGNOSIS — E1149 Type 2 diabetes mellitus with other diabetic neurological complication: Secondary | ICD-10-CM | POA: Diagnosis not present

## 2013-01-15 ENCOUNTER — Other Ambulatory Visit: Payer: Self-pay

## 2013-01-15 MED ORDER — HYDROCODONE-ACETAMINOPHEN 10-325 MG PO TABS: ORAL_TABLET | ORAL | Status: DC

## 2013-02-13 ENCOUNTER — Other Ambulatory Visit: Payer: Self-pay | Admitting: *Deleted

## 2013-02-13 MED ORDER — HYDROCODONE-ACETAMINOPHEN 10-325 MG PO TABS: ORAL_TABLET | ORAL | Status: DC

## 2013-03-12 ENCOUNTER — Other Ambulatory Visit: Payer: Medicare Other

## 2013-03-13 ENCOUNTER — Encounter: Payer: Self-pay | Admitting: Internal Medicine

## 2013-03-13 ENCOUNTER — Ambulatory Visit (INDEPENDENT_AMBULATORY_CARE_PROVIDER_SITE_OTHER): Payer: Medicare Other | Admitting: Internal Medicine

## 2013-03-13 VITALS — BP 126/70 | HR 64 | Temp 98.1°F | Wt 183.0 lb

## 2013-03-13 DIAGNOSIS — M1990 Unspecified osteoarthritis, unspecified site: Secondary | ICD-10-CM | POA: Diagnosis not present

## 2013-03-13 DIAGNOSIS — F3289 Other specified depressive episodes: Secondary | ICD-10-CM

## 2013-03-13 DIAGNOSIS — E039 Hypothyroidism, unspecified: Secondary | ICD-10-CM | POA: Diagnosis not present

## 2013-03-13 DIAGNOSIS — E1149 Type 2 diabetes mellitus with other diabetic neurological complication: Secondary | ICD-10-CM

## 2013-03-13 DIAGNOSIS — G609 Hereditary and idiopathic neuropathy, unspecified: Secondary | ICD-10-CM | POA: Diagnosis not present

## 2013-03-13 DIAGNOSIS — E1142 Type 2 diabetes mellitus with diabetic polyneuropathy: Secondary | ICD-10-CM | POA: Diagnosis not present

## 2013-03-13 DIAGNOSIS — I10 Essential (primary) hypertension: Secondary | ICD-10-CM | POA: Diagnosis not present

## 2013-03-13 DIAGNOSIS — F32A Depression, unspecified: Secondary | ICD-10-CM

## 2013-03-13 DIAGNOSIS — E785 Hyperlipidemia, unspecified: Secondary | ICD-10-CM | POA: Diagnosis not present

## 2013-03-13 DIAGNOSIS — F329 Major depressive disorder, single episode, unspecified: Secondary | ICD-10-CM

## 2013-03-13 DIAGNOSIS — E876 Hypokalemia: Secondary | ICD-10-CM

## 2013-03-13 DIAGNOSIS — M542 Cervicalgia: Secondary | ICD-10-CM

## 2013-03-13 DIAGNOSIS — G629 Polyneuropathy, unspecified: Secondary | ICD-10-CM

## 2013-03-13 MED ORDER — NABUMETONE 500 MG PO TABS: 500.0000 mg | ORAL_TABLET | Freq: Two times a day (BID) | ORAL | Status: DC

## 2013-03-13 MED ORDER — HYDROCODONE-ACETAMINOPHEN 10-325 MG PO TABS: ORAL_TABLET | ORAL | Status: DC

## 2013-03-13 MED ORDER — TRIAMTERENE-HCTZ 37.5-25 MG PO TABS: ORAL_TABLET | ORAL | Status: DC
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
## 2013-03-14 ENCOUNTER — Ambulatory Visit: Payer: Medicare Other | Admitting: Internal Medicine

## 2013-03-14 LAB — CBC WITH DIFFERENTIAL/PLATELET
Basophils Absolute: 0 10*3/uL (ref 0.0–0.2)
Basos: 0 %
Eos: 6 %
Eosinophils Absolute: 0.8 10*3/uL — ABNORMAL HIGH (ref 0.0–0.4)
HCT: 37.2 % (ref 34.0–46.6)
Hemoglobin: 12.2 g/dL (ref 11.1–15.9)
Immature Grans (Abs): 0 10*3/uL (ref 0.0–0.1)
Immature Granulocytes: 0 %
Lymphocytes Absolute: 2.7 10*3/uL (ref 0.7–3.1)
Lymphs: 19 %
MCH: 25.3 pg — ABNORMAL LOW (ref 26.6–33.0)
MCHC: 32.8 g/dL (ref 31.5–35.7)
MCV: 77 fL — ABNORMAL LOW (ref 79–97)
Monocytes Absolute: 1.2 10*3/uL — ABNORMAL HIGH (ref 0.1–0.9)
Monocytes: 8 %
Neutrophils Absolute: 9.6 10*3/uL — ABNORMAL HIGH (ref 1.4–7.0)
Neutrophils Relative %: 67 %
RBC: 4.82 x10E6/uL (ref 3.77–5.28)
RDW: 16.6 % — ABNORMAL HIGH (ref 12.3–15.4)
WBC: 14.3 10*3/uL — ABNORMAL HIGH (ref 3.4–10.8)

## 2013-03-14 LAB — BASIC METABOLIC PANEL
BUN/Creatinine Ratio: 16 (ref 11–26)
BUN: 9 mg/dL (ref 8–27)
CO2: 31 mmol/L — ABNORMAL HIGH (ref 18–29)
Calcium: 9.1 mg/dL (ref 8.7–10.3)
Chloride: 91 mmol/L — ABNORMAL LOW (ref 97–108)
Creatinine, Ser: 0.58 mg/dL (ref 0.57–1.00)
GFR calc Af Amer: 104 mL/min/{1.73_m2} (ref >59)
GFR calc non Af Amer: 90 mL/min/{1.73_m2} (ref >59)
Glucose: 89 mg/dL (ref 65–99)
Potassium: 3.9 mmol/L (ref 3.5–5.2)
Sodium: 136 mmol/L (ref 134–144)

## 2013-03-14 LAB — HEMOGLOBIN A1C
Est. average glucose Bld gHb Est-mCnc: 123 mg/dL
Hgb A1c MFr Bld: 5.9 % — ABNORMAL HIGH (ref 4.8–5.6)

## 2013-03-14 LAB — LIPID PANEL
Chol/HDL Ratio: 5.5 ratio units — ABNORMAL HIGH (ref 0.0–4.4)
Cholesterol, Total: 226 mg/dL — ABNORMAL HIGH (ref 100–199)
HDL: 41 mg/dL (ref >39)
LDL Calculated: 120 mg/dL — ABNORMAL HIGH (ref 0–99)
Triglycerides: 325 mg/dL — ABNORMAL HIGH (ref 0–149)
VLDL Cholesterol Cal: 65 mg/dL — ABNORMAL HIGH (ref 5–40)

## 2013-03-14 LAB — TSH: TSH: 3.55 u[IU]/mL (ref 0.450–4.500)

## 2013-03-16 ENCOUNTER — Other Ambulatory Visit: Payer: Self-pay | Admitting: *Deleted

## 2013-03-16 DIAGNOSIS — D72829 Elevated white blood cell count, unspecified: Secondary | ICD-10-CM

## 2013-03-21 ENCOUNTER — Ambulatory Visit: Payer: Medicare Other | Admitting: Internal Medicine

## 2013-03-21 ENCOUNTER — Other Ambulatory Visit: Payer: Self-pay

## 2013-04-11 ENCOUNTER — Other Ambulatory Visit: Payer: Self-pay | Admitting: *Deleted

## 2013-04-11 MED ORDER — HYDROCODONE-ACETAMINOPHEN 10-325 MG PO TABS: ORAL_TABLET | ORAL | Status: DC

## 2013-04-23 ENCOUNTER — Other Ambulatory Visit: Payer: Self-pay | Admitting: Nurse Practitioner

## 2013-05-04 DIAGNOSIS — L851 Acquired keratosis [keratoderma] palmaris et plantaris: Secondary | ICD-10-CM | POA: Diagnosis not present

## 2013-05-04 DIAGNOSIS — E1149 Type 2 diabetes mellitus with other diabetic neurological complication: Secondary | ICD-10-CM | POA: Diagnosis not present

## 2013-05-04 DIAGNOSIS — B351 Tinea unguium: Secondary | ICD-10-CM | POA: Diagnosis not present

## 2013-05-09 ENCOUNTER — Other Ambulatory Visit: Payer: Self-pay | Admitting: *Deleted

## 2013-05-09 MED ORDER — HYDROCODONE-ACETAMINOPHEN 10-325 MG PO TABS: ORAL_TABLET | ORAL | Status: DC

## 2013-06-12 ENCOUNTER — Encounter: Payer: Self-pay | Admitting: Internal Medicine

## 2013-06-12 ENCOUNTER — Ambulatory Visit (INDEPENDENT_AMBULATORY_CARE_PROVIDER_SITE_OTHER): Payer: Medicare Other | Admitting: Internal Medicine

## 2013-06-12 VITALS — BP 124/76 | HR 59 | Temp 98.6°F | Resp 10 | Wt 180.0 lb

## 2013-06-12 DIAGNOSIS — I10 Essential (primary) hypertension: Secondary | ICD-10-CM

## 2013-06-12 DIAGNOSIS — E039 Hypothyroidism, unspecified: Secondary | ICD-10-CM | POA: Diagnosis not present

## 2013-06-12 DIAGNOSIS — G8929 Other chronic pain: Secondary | ICD-10-CM

## 2013-06-12 DIAGNOSIS — M545 Low back pain, unspecified: Secondary | ICD-10-CM

## 2013-06-12 DIAGNOSIS — E785 Hyperlipidemia, unspecified: Secondary | ICD-10-CM | POA: Diagnosis not present

## 2013-06-12 DIAGNOSIS — G629 Polyneuropathy, unspecified: Secondary | ICD-10-CM

## 2013-06-12 DIAGNOSIS — E1149 Type 2 diabetes mellitus with other diabetic neurological complication: Secondary | ICD-10-CM

## 2013-06-12 DIAGNOSIS — G609 Hereditary and idiopathic neuropathy, unspecified: Secondary | ICD-10-CM | POA: Diagnosis not present

## 2013-06-12 DIAGNOSIS — K219 Gastro-esophageal reflux disease without esophagitis: Secondary | ICD-10-CM

## 2013-06-12 MED ORDER — HYDROCODONE-ACETAMINOPHEN 10-325 MG PO TABS: ORAL_TABLET | ORAL | Status: DC

## 2013-06-12 MED ORDER — LEVOTHYROXINE SODIUM 125 MCG PO TABS: ORAL_TABLET | ORAL | Status: DC

## 2013-06-12 MED ORDER — ROSUVASTATIN CALCIUM 40 MG PO TABS: ORAL_TABLET | ORAL | Status: DC

## 2013-06-12 MED ORDER — RANITIDINE HCL 150 MG PO TABS: ORAL_TABLET | ORAL | Status: DC

## 2013-06-12 MED ORDER — GABAPENTIN 300 MG PO CAPS: 300.0000 mg | ORAL_CAPSULE | Freq: Every day | ORAL | Status: DC
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
## 2013-06-13 LAB — CBC WITH DIFFERENTIAL/PLATELET
Basophils Absolute: 0 10*3/uL (ref 0.0–0.2)
Basos: 0 %
Eos: 4 %
Eosinophils Absolute: 0.4 10*3/uL (ref 0.0–0.4)
HCT: 39 % (ref 34.0–46.6)
Hemoglobin: 12.2 g/dL (ref 11.1–15.9)
Immature Grans (Abs): 0 10*3/uL (ref 0.0–0.1)
Immature Granulocytes: 0 %
Lymphocytes Absolute: 2.9 10*3/uL (ref 0.7–3.1)
Lymphs: 28 %
MCH: 25.6 pg — ABNORMAL LOW (ref 26.6–33.0)
MCHC: 31.3 g/dL — ABNORMAL LOW (ref 31.5–35.7)
MCV: 82 fL (ref 79–97)
Monocytes Absolute: 1.3 10*3/uL — ABNORMAL HIGH (ref 0.1–0.9)
Monocytes: 12 %
Neutrophils Absolute: 6 10*3/uL (ref 1.4–7.0)
Neutrophils Relative %: 56 %
RBC: 4.77 x10E6/uL (ref 3.77–5.28)
RDW: 16.8 % — ABNORMAL HIGH (ref 12.3–15.4)
WBC: 10.7 10*3/uL (ref 3.4–10.8)

## 2013-06-13 LAB — LIPID PANEL
Chol/HDL Ratio: 5.9 ratio units — ABNORMAL HIGH (ref 0.0–4.4)
Cholesterol, Total: 217 mg/dL — ABNORMAL HIGH (ref 100–199)
HDL: 37 mg/dL — ABNORMAL LOW (ref >39)
LDL Calculated: 122 mg/dL — ABNORMAL HIGH (ref 0–99)
Triglycerides: 290 mg/dL — ABNORMAL HIGH (ref 0–149)
VLDL Cholesterol Cal: 58 mg/dL — ABNORMAL HIGH (ref 5–40)

## 2013-06-13 LAB — TSH: TSH: 0.72 u[IU]/mL (ref 0.450–4.500)

## 2013-06-13 LAB — COMPREHENSIVE METABOLIC PANEL
ALT: 8 IU/L (ref 0–32)
AST: 14 IU/L (ref 0–40)
Albumin/Globulin Ratio: 1.6 (ref 1.1–2.5)
Albumin: 4.1 g/dL (ref 3.5–4.8)
Alkaline Phosphatase: 89 IU/L (ref 39–117)
BUN/Creatinine Ratio: 13 (ref 11–26)
BUN: 10 mg/dL (ref 8–27)
CO2: 28 mmol/L (ref 18–29)
Calcium: 9.6 mg/dL (ref 8.7–10.3)
Chloride: 94 mmol/L — ABNORMAL LOW (ref 97–108)
Creatinine, Ser: 0.77 mg/dL (ref 0.57–1.00)
GFR calc Af Amer: 87 mL/min/{1.73_m2} (ref >59)
GFR calc non Af Amer: 75 mL/min/{1.73_m2} (ref >59)
Globulin, Total: 2.5 g/dL (ref 1.5–4.5)
Glucose: 89 mg/dL (ref 65–99)
Potassium: 5.1 mmol/L (ref 3.5–5.2)
Sodium: 139 mmol/L (ref 134–144)
Total Bilirubin: 0.4 mg/dL (ref 0.0–1.2)
Total Protein: 6.6 g/dL (ref 6.0–8.5)

## 2013-06-19 ENCOUNTER — Other Ambulatory Visit: Payer: Self-pay | Admitting: Internal Medicine

## 2013-07-09 DIAGNOSIS — H40019 Open angle with borderline findings, low risk, unspecified eye: Secondary | ICD-10-CM | POA: Diagnosis not present

## 2013-07-09 DIAGNOSIS — H353290 Exudative age-related macular degeneration, unspecified eye, stage unspecified: Secondary | ICD-10-CM | POA: Diagnosis not present

## 2013-07-09 DIAGNOSIS — H2589 Other age-related cataract: Secondary | ICD-10-CM | POA: Diagnosis not present

## 2013-07-09 DIAGNOSIS — H353190 Nonexudative age-related macular degeneration, unspecified eye, stage unspecified: Secondary | ICD-10-CM | POA: Diagnosis not present

## 2013-07-11 ENCOUNTER — Other Ambulatory Visit: Payer: Self-pay | Admitting: *Deleted

## 2013-07-11 MED ORDER — HYDROCODONE-ACETAMINOPHEN 10-325 MG PO TABS: ORAL_TABLET | ORAL | Status: DC

## 2013-07-18 DIAGNOSIS — L851 Acquired keratosis [keratoderma] palmaris et plantaris: Secondary | ICD-10-CM | POA: Diagnosis not present

## 2013-07-18 DIAGNOSIS — E1149 Type 2 diabetes mellitus with other diabetic neurological complication: Secondary | ICD-10-CM | POA: Diagnosis not present

## 2013-07-18 DIAGNOSIS — B351 Tinea unguium: Secondary | ICD-10-CM | POA: Diagnosis not present

## 2013-07-25 DIAGNOSIS — H353290 Exudative age-related macular degeneration, unspecified eye, stage unspecified: Secondary | ICD-10-CM | POA: Diagnosis not present

## 2013-07-25 DIAGNOSIS — H2510 Age-related nuclear cataract, unspecified eye: Secondary | ICD-10-CM | POA: Diagnosis not present

## 2013-07-25 DIAGNOSIS — H353190 Nonexudative age-related macular degeneration, unspecified eye, stage unspecified: Secondary | ICD-10-CM | POA: Diagnosis not present

## 2013-07-25 DIAGNOSIS — IMO0001 Reserved for inherently not codable concepts without codable children: Secondary | ICD-10-CM | POA: Diagnosis not present

## 2013-08-09 ENCOUNTER — Other Ambulatory Visit: Payer: Self-pay | Admitting: *Deleted

## 2013-08-09 MED ORDER — HYDROCODONE-ACETAMINOPHEN 10-325 MG PO TABS: ORAL_TABLET | ORAL | Status: DC

## 2013-08-15 DIAGNOSIS — H353290 Exudative age-related macular degeneration, unspecified eye, stage unspecified: Secondary | ICD-10-CM | POA: Diagnosis not present

## 2013-09-05 ENCOUNTER — Other Ambulatory Visit: Payer: Self-pay | Admitting: Internal Medicine

## 2013-09-10 ENCOUNTER — Other Ambulatory Visit: Payer: Self-pay

## 2013-09-10 MED ORDER — HYDROCODONE-ACETAMINOPHEN 10-325 MG PO TABS: ORAL_TABLET | ORAL | Status: DC

## 2013-09-10 NOTE — Telephone Encounter (Signed)
RX placed on ledge for sig then to the front for pick-up, patient aware

## 2013-09-11 ENCOUNTER — Other Ambulatory Visit: Payer: Self-pay

## 2013-09-12 DIAGNOSIS — H353290 Exudative age-related macular degeneration, unspecified eye, stage unspecified: Secondary | ICD-10-CM | POA: Diagnosis not present

## 2013-09-18 ENCOUNTER — Other Ambulatory Visit: Payer: Self-pay

## 2013-09-20 ENCOUNTER — Telehealth: Payer: Self-pay | Admitting: *Deleted

## 2013-09-20 MED ORDER — METHOCARBAMOL 500 MG PO TABS: ORAL_TABLET | ORAL | Status: DC

## 2013-09-20 MED ORDER — NAPROXEN 500 MG PO TABS
500.0000 mg | ORAL_TABLET | Freq: Two times a day (BID) | ORAL | Status: DC
Start: 2013-09-20 — End: 2013-10-24

## 2013-09-20 NOTE — Telephone Encounter (Signed)
Let's have her on naproxyn 500 mg bid x 5 days and robaxin 500 mg every 12 hour as needed for muscle spasm/ tightness for 5 days only. If no imporvement or has worsening of symptom, to be seen in clinic

## 2013-09-20 NOTE — Telephone Encounter (Signed)
Patient Notified and faxed Rx into pharmacy 

## 2013-09-20 NOTE — Telephone Encounter (Signed)
Patient called and stated that she is having neck pain again like before and would like the medication called in you called in for her in the past. Patient states that this time is worse than before. Patient states that she can't turn her head much without it hurting or can she look up. Please Advise.  Pharmacy [place] [phone]

## 2013-09-24 DIAGNOSIS — E1149 Type 2 diabetes mellitus with other diabetic neurological complication: Secondary | ICD-10-CM | POA: Diagnosis not present

## 2013-09-24 DIAGNOSIS — B351 Tinea unguium: Secondary | ICD-10-CM | POA: Diagnosis not present

## 2013-09-24 DIAGNOSIS — L851 Acquired keratosis [keratoderma] palmaris et plantaris: Secondary | ICD-10-CM | POA: Diagnosis not present

## 2013-09-26 ENCOUNTER — Encounter: Payer: Self-pay | Admitting: Internal Medicine

## 2013-10-10 ENCOUNTER — Other Ambulatory Visit: Payer: Self-pay | Admitting: *Deleted

## 2013-10-10 DIAGNOSIS — E789 Disorder of lipoprotein metabolism, unspecified: Secondary | ICD-10-CM

## 2013-10-10 DIAGNOSIS — E039 Hypothyroidism, unspecified: Secondary | ICD-10-CM

## 2013-10-10 DIAGNOSIS — I10 Essential (primary) hypertension: Secondary | ICD-10-CM

## 2013-10-11 ENCOUNTER — Other Ambulatory Visit: Payer: Self-pay | Admitting: *Deleted

## 2013-10-11 DIAGNOSIS — E1110 Type 2 diabetes mellitus with ketoacidosis without coma: Secondary | ICD-10-CM

## 2013-10-11 MED ORDER — HYDROCODONE-ACETAMINOPHEN 10-325 MG PO TABS: ORAL_TABLET | ORAL | Status: DC

## 2013-10-12 ENCOUNTER — Other Ambulatory Visit: Payer: Medicare Other

## 2013-10-12 ENCOUNTER — Other Ambulatory Visit: Payer: Self-pay | Admitting: *Deleted

## 2013-10-12 DIAGNOSIS — E789 Disorder of lipoprotein metabolism, unspecified: Secondary | ICD-10-CM | POA: Diagnosis not present

## 2013-10-12 DIAGNOSIS — E039 Hypothyroidism, unspecified: Secondary | ICD-10-CM | POA: Diagnosis not present

## 2013-10-12 DIAGNOSIS — D72829 Elevated white blood cell count, unspecified: Secondary | ICD-10-CM | POA: Diagnosis not present

## 2013-10-12 DIAGNOSIS — I10 Essential (primary) hypertension: Secondary | ICD-10-CM | POA: Diagnosis not present

## 2013-10-12 DIAGNOSIS — E119 Type 2 diabetes mellitus without complications: Secondary | ICD-10-CM

## 2013-10-13 LAB — CBC WITH DIFFERENTIAL/PLATELET
Basophils Absolute: 0 10*3/uL (ref 0.0–0.2)
Basos: 0 %
Eos: 6 %
Eosinophils Absolute: 0.6 10*3/uL — ABNORMAL HIGH (ref 0.0–0.4)
HCT: 38.6 % (ref 34.0–46.6)
Hemoglobin: 12.1 g/dL (ref 11.1–15.9)
Immature Grans (Abs): 0 10*3/uL (ref 0.0–0.1)
Immature Granulocytes: 0 %
Lymphocytes Absolute: 2.9 10*3/uL (ref 0.7–3.1)
Lymphs: 30 %
MCH: 24.7 pg — ABNORMAL LOW (ref 26.6–33.0)
MCHC: 31.3 g/dL — ABNORMAL LOW (ref 31.5–35.7)
MCV: 79 fL (ref 79–97)
Monocytes Absolute: 1 10*3/uL — ABNORMAL HIGH (ref 0.1–0.9)
Monocytes: 10 %
Neutrophils Absolute: 5.2 10*3/uL (ref 1.4–7.0)
Neutrophils Relative %: 54 %
RBC: 4.9 x10E6/uL (ref 3.77–5.28)
RDW: 16.6 % — ABNORMAL HIGH (ref 12.3–15.4)
WBC: 9.7 10*3/uL (ref 3.4–10.8)

## 2013-10-13 LAB — COMPREHENSIVE METABOLIC PANEL
ALT: 8 IU/L (ref 0–32)
AST: 14 IU/L (ref 0–40)
Albumin/Globulin Ratio: 1.6 (ref 1.1–2.5)
Albumin: 4.2 g/dL (ref 3.5–4.8)
Alkaline Phosphatase: 89 IU/L (ref 39–117)
BUN/Creatinine Ratio: 14 (ref 11–26)
BUN: 10 mg/dL (ref 8–27)
CO2: 31 mmol/L — ABNORMAL HIGH (ref 18–29)
Calcium: 9.3 mg/dL (ref 8.7–10.3)
Chloride: 93 mmol/L — ABNORMAL LOW (ref 97–108)
Creatinine, Ser: 0.72 mg/dL (ref 0.57–1.00)
GFR calc Af Amer: 93 mL/min/{1.73_m2} (ref >59)
GFR calc non Af Amer: 81 mL/min/{1.73_m2} (ref >59)
Globulin, Total: 2.6 g/dL (ref 1.5–4.5)
Glucose: 85 mg/dL (ref 65–99)
Potassium: 4.2 mmol/L (ref 3.5–5.2)
Sodium: 137 mmol/L (ref 134–144)
Total Bilirubin: 0.3 mg/dL (ref 0.0–1.2)
Total Protein: 6.8 g/dL (ref 6.0–8.5)

## 2013-10-13 LAB — LIPID PANEL
Chol/HDL Ratio: 5 ratio units — ABNORMAL HIGH (ref 0.0–4.4)
Cholesterol, Total: 213 mg/dL — ABNORMAL HIGH (ref 100–199)
HDL: 43 mg/dL (ref >39)
LDL Calculated: 126 mg/dL — ABNORMAL HIGH (ref 0–99)
Triglycerides: 219 mg/dL — ABNORMAL HIGH (ref 0–149)
VLDL Cholesterol Cal: 44 mg/dL — ABNORMAL HIGH (ref 5–40)

## 2013-10-13 LAB — TSH: TSH: 2.85 u[IU]/mL (ref 0.450–4.500)

## 2013-10-13 LAB — HEMOGLOBIN A1C
Est. average glucose Bld gHb Est-mCnc: 134 mg/dL
Hgb A1c MFr Bld: 6.3 % — ABNORMAL HIGH (ref 4.8–5.6)

## 2013-10-17 DIAGNOSIS — H353290 Exudative age-related macular degeneration, unspecified eye, stage unspecified: Secondary | ICD-10-CM | POA: Diagnosis not present

## 2013-10-18 ENCOUNTER — Other Ambulatory Visit: Payer: Self-pay

## 2013-10-24 ENCOUNTER — Encounter: Payer: Self-pay | Admitting: Internal Medicine

## 2013-10-24 ENCOUNTER — Ambulatory Visit (INDEPENDENT_AMBULATORY_CARE_PROVIDER_SITE_OTHER): Payer: Medicare Other | Admitting: Internal Medicine

## 2013-10-24 VITALS — BP 130/60 | HR 62 | Temp 98.3°F | Resp 18 | Ht 66.5 in | Wt 174.6 lb

## 2013-10-24 DIAGNOSIS — K219 Gastro-esophageal reflux disease without esophagitis: Secondary | ICD-10-CM | POA: Diagnosis not present

## 2013-10-24 DIAGNOSIS — M899 Disorder of bone, unspecified: Secondary | ICD-10-CM

## 2013-10-24 DIAGNOSIS — F329 Major depressive disorder, single episode, unspecified: Secondary | ICD-10-CM

## 2013-10-24 DIAGNOSIS — M8580 Other specified disorders of bone density and structure, unspecified site: Secondary | ICD-10-CM | POA: Insufficient documentation

## 2013-10-24 DIAGNOSIS — M545 Low back pain, unspecified: Secondary | ICD-10-CM

## 2013-10-24 DIAGNOSIS — F3289 Other specified depressive episodes: Secondary | ICD-10-CM

## 2013-10-24 DIAGNOSIS — E1149 Type 2 diabetes mellitus with other diabetic neurological complication: Secondary | ICD-10-CM | POA: Diagnosis not present

## 2013-10-24 DIAGNOSIS — E039 Hypothyroidism, unspecified: Secondary | ICD-10-CM

## 2013-10-24 DIAGNOSIS — E785 Hyperlipidemia, unspecified: Secondary | ICD-10-CM

## 2013-10-24 DIAGNOSIS — I10 Essential (primary) hypertension: Secondary | ICD-10-CM | POA: Diagnosis not present

## 2013-10-24 DIAGNOSIS — G8929 Other chronic pain: Secondary | ICD-10-CM

## 2013-10-24 DIAGNOSIS — M949 Disorder of cartilage, unspecified: Secondary | ICD-10-CM

## 2013-10-24 DIAGNOSIS — F32A Depression, unspecified: Secondary | ICD-10-CM

## 2013-10-24 DIAGNOSIS — Z0000 Encounter for general adult medical examination without abnormal findings: Secondary | ICD-10-CM

## 2013-10-24 DIAGNOSIS — M542 Cervicalgia: Secondary | ICD-10-CM | POA: Insufficient documentation

## 2013-10-24 DIAGNOSIS — Z1239 Encounter for other screening for malignant neoplasm of breast: Secondary | ICD-10-CM

## 2013-10-24 MED ORDER — CYCLOBENZAPRINE HCL 5 MG PO TABS: 5.0000 mg | ORAL_TABLET | Freq: Three times a day (TID) | ORAL | Status: DC | PRN

## 2013-10-24 MED ORDER — NAPROXEN 500 MG PO TBEC: 500.0000 mg | DELAYED_RELEASE_TABLET | Freq: Two times a day (BID) | ORAL | Status: DC

## 2013-10-24 MED ORDER — TRIAMTERENE-HCTZ 37.5-25 MG PO TABS: ORAL_TABLET | ORAL | Status: DC

## 2013-10-24 MED ORDER — CITALOPRAM HYDROBROMIDE 40 MG PO TABS: ORAL_TABLET | ORAL | Status: DC

## 2013-10-24 MED ORDER — RANITIDINE HCL 150 MG PO TABS: ORAL_TABLET | ORAL | Status: DC

## 2013-10-24 MED ORDER — LEVOTHYROXINE SODIUM 125 MCG PO TABS: ORAL_TABLET | ORAL | Status: DC
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
## 2013-11-12 ENCOUNTER — Other Ambulatory Visit: Payer: Self-pay | Admitting: Internal Medicine

## 2013-11-12 ENCOUNTER — Ambulatory Visit
Admission: RE | Admit: 2013-11-12 | Discharge: 2013-11-12 | Disposition: A | Discharge status: Home / Self Care | Payer: Medicare Other | Source: Ambulatory Visit | Attending: Internal Medicine | Admitting: Internal Medicine

## 2013-11-12 ENCOUNTER — Other Ambulatory Visit: Payer: Self-pay | Admitting: *Deleted

## 2013-11-12 ENCOUNTER — Other Ambulatory Visit: Payer: Medicare Other

## 2013-11-12 DIAGNOSIS — M542 Cervicalgia: Secondary | ICD-10-CM

## 2013-11-12 DIAGNOSIS — M810 Age-related osteoporosis without current pathological fracture: Secondary | ICD-10-CM | POA: Diagnosis not present

## 2013-11-12 DIAGNOSIS — Z1239 Encounter for other screening for malignant neoplasm of breast: Secondary | ICD-10-CM

## 2013-11-12 DIAGNOSIS — E1149 Type 2 diabetes mellitus with other diabetic neurological complication: Secondary | ICD-10-CM | POA: Diagnosis not present

## 2013-11-12 DIAGNOSIS — Z1231 Encounter for screening mammogram for malignant neoplasm of breast: Secondary | ICD-10-CM

## 2013-11-12 DIAGNOSIS — M8580 Other specified disorders of bone density and structure, unspecified site: Secondary | ICD-10-CM

## 2013-11-12 DIAGNOSIS — M47812 Spondylosis without myelopathy or radiculopathy, cervical region: Secondary | ICD-10-CM | POA: Diagnosis not present

## 2013-11-12 DIAGNOSIS — M5030 Other cervical disc degeneration, unspecified cervical region: Secondary | ICD-10-CM | POA: Diagnosis not present

## 2013-11-12 DIAGNOSIS — Z0000 Encounter for general adult medical examination without abnormal findings: Secondary | ICD-10-CM

## 2013-11-12 LAB — HM DEXA SCAN

## 2013-11-12 MED ORDER — HYDROCODONE-ACETAMINOPHEN 10-325 MG PO TABS: ORAL_TABLET | ORAL | Status: DC

## 2013-11-12 MED ORDER — NAPROXEN 500 MG PO TABS: ORAL_TABLET | ORAL | Status: DC

## 2013-11-12 NOTE — Telephone Encounter (Signed)
Patient stopped by and needed the Naprosyn changed from Extended release to regular Strength for insurance to cover. Sent in new Rx.

## 2013-11-12 NOTE — Telephone Encounter (Signed)
Patient Requested and picked up 

## 2013-11-13 ENCOUNTER — Telehealth: Payer: Self-pay | Admitting: *Deleted

## 2013-11-13 ENCOUNTER — Telehealth: Payer: Self-pay

## 2013-11-13 DIAGNOSIS — M810 Age-related osteoporosis without current pathological fracture: Secondary | ICD-10-CM

## 2013-11-13 LAB — MICROALBUMIN / CREATININE URINE RATIO
Creatinine, Ur: 43.8 mg/dL (ref 15.0–278.0)
MICROALB/CREAT RATIO: <6.8 mg/g creat (ref 0.0–30.0)
Microalbumin, Urine: <3 ug/mL (ref 0.0–17.0)

## 2013-11-13 MED ORDER — ALENDRONATE SODIUM 70 MG PO TABS: 70.0000 mg | ORAL_TABLET | ORAL | Status: DC

## 2013-11-13 NOTE — Telephone Encounter (Signed)
ID # [id number] Called to initiate Prior Auth  Call was transferred b/c this is a [employer] account, PA number for department that covers [employer] accounts is [phone].   Medication was already denied on the 15th of September. Patient will need an appeal and the provider will need to make this appeal by calling [phone] (Option 1)

## 2013-11-13 NOTE — Telephone Encounter (Signed)
Appealed for Cyclobenzaprine filled out for Abigail D. Patient dropped off form and I filled it out. Patient wanted it mailed back to her so she could put a receipt with it. Mailed.

## 2013-11-13 NOTE — Telephone Encounter (Signed)
Discussed results with patient, patient verbalized understanding of results. [facility] ([facility] Patient does NOT smoke.

## 2013-11-13 NOTE — Telephone Encounter (Signed)
Message copied by Logan Bores on Tue Nov 13, 2013 10:12 AM ------      Message from: Blanchie Serve      Created: Mon Nov 12, 2013  5:58 PM       Your mammogram result is normal. Your xray of neck shows arhtritis changes. Your bone scan shows thinned out bones called osteoporosis. i will need to start you on fosamax 70 mg once a week (empty stomach with a tall glass of water and do not lie down for an hour after taking it). You also need adequate intake of calcium and vitamin d in your diet. You can take calcium-vitamin d supplement like oscal 600-400 one tablet twice a day. I also recommend you to exercise for 30 minutes at least 3 days a week. If you are currently smoking, I would recommend stopping it. ------

## 2013-11-13 NOTE — Telephone Encounter (Signed)
D/c flexeril. To take robaxin 500mg  po every 12 hour as needed for muscle spasm

## 2013-11-14 DIAGNOSIS — H353190 Nonexudative age-related macular degeneration, unspecified eye, stage unspecified: Secondary | ICD-10-CM | POA: Diagnosis not present

## 2013-11-14 DIAGNOSIS — IMO0001 Reserved for inherently not codable concepts without codable children: Secondary | ICD-10-CM | POA: Diagnosis not present

## 2013-11-14 DIAGNOSIS — H353290 Exudative age-related macular degeneration, unspecified eye, stage unspecified: Secondary | ICD-10-CM | POA: Diagnosis not present

## 2013-11-14 DIAGNOSIS — H2510 Age-related nuclear cataract, unspecified eye: Secondary | ICD-10-CM | POA: Diagnosis not present

## 2013-11-14 MED ORDER — METHOCARBAMOL 500 MG PO TABS: ORAL_TABLET | ORAL | Status: DC

## 2013-11-14 NOTE — Telephone Encounter (Signed)
LMOM to return call.

## 2013-11-14 NOTE — Telephone Encounter (Signed)
Patient Notified and faxed Rx into pharmacy 

## 2013-11-16 ENCOUNTER — Encounter: Payer: Self-pay | Admitting: *Deleted

## 2013-11-26 ENCOUNTER — Other Ambulatory Visit: Payer: Self-pay | Admitting: *Deleted

## 2013-11-26 MED ORDER — NAPROXEN 500 MG PO TABS: ORAL_TABLET | ORAL | Status: DC

## 2013-11-26 MED ORDER — POTASSIUM CHLORIDE ER 10 MEQ PO TBCR: 10.0000 meq | EXTENDED_RELEASE_TABLET | Freq: Two times a day (BID) | ORAL | Status: DC

## 2013-11-28 ENCOUNTER — Other Ambulatory Visit: Payer: Self-pay | Admitting: *Deleted

## 2013-11-28 MED ORDER — POTASSIUM CHLORIDE ER 10 MEQ PO TBCR: 10.0000 meq | EXTENDED_RELEASE_TABLET | Freq: Two times a day (BID) | ORAL | Status: DC

## 2013-11-28 NOTE — Telephone Encounter (Signed)
[facility]

## 2013-12-04 ENCOUNTER — Other Ambulatory Visit: Payer: Self-pay | Admitting: *Deleted

## 2013-12-04 DIAGNOSIS — E039 Hypothyroidism, unspecified: Secondary | ICD-10-CM

## 2013-12-04 MED ORDER — LEVOTHYROXINE SODIUM 125 MCG PO TABS: ORAL_TABLET | ORAL | Status: DC

## 2013-12-04 NOTE — Telephone Encounter (Signed)
[facility]

## 2013-12-05 DIAGNOSIS — B351 Tinea unguium: Secondary | ICD-10-CM | POA: Diagnosis not present

## 2013-12-05 DIAGNOSIS — E1142 Type 2 diabetes mellitus with diabetic polyneuropathy: Secondary | ICD-10-CM | POA: Diagnosis not present

## 2013-12-05 DIAGNOSIS — L84 Corns and callosities: Secondary | ICD-10-CM | POA: Diagnosis not present

## 2013-12-06 ENCOUNTER — Telehealth: Payer: Self-pay | Admitting: Internal Medicine

## 2013-12-06 NOTE — Telephone Encounter (Signed)
Called pt says she was diagnosed with Sleep Apnea a few years ago by a doctor in [place].  Dr. Purcell Mouton.Marland KitchenMarland KitchenMarland KitchenSays she use the oxygen at night while on the Sleep Apnea machine.[employer] Certificate of Medical Necessity Form in Dr. Jackolyn Confer form folder.....cdavis

## 2013-12-12 ENCOUNTER — Other Ambulatory Visit: Payer: Self-pay | Admitting: *Deleted

## 2013-12-12 ENCOUNTER — Other Ambulatory Visit: Payer: Medicare Other

## 2013-12-12 ENCOUNTER — Other Ambulatory Visit: Payer: Self-pay | Admitting: Internal Medicine

## 2013-12-12 DIAGNOSIS — Z1211 Encounter for screening for malignant neoplasm of colon: Secondary | ICD-10-CM

## 2013-12-12 MED ORDER — METHOCARBAMOL 500 MG PO TABS: ORAL_TABLET | ORAL | Status: DC

## 2013-12-12 MED ORDER — HYDROCODONE-ACETAMINOPHEN 10-325 MG PO TABS: ORAL_TABLET | ORAL | Status: DC

## 2013-12-12 MED ORDER — NAPROXEN 500 MG PO TABS: ORAL_TABLET | ORAL | Status: DC

## 2013-12-12 NOTE — Telephone Encounter (Signed)
Patient states that she does not have a Pulmonary Dr. And that she cannot afford another Dr. Please Advise.

## 2013-12-12 NOTE — Telephone Encounter (Signed)
Patient walked in and requested to be faxed to pharmacy

## 2013-12-12 NOTE — Telephone Encounter (Signed)
I have the [facility] form in my office.  Dr. Bubba Camp says pt needs to schedule an appointment with her Pulmonary Physician to discuss her oxygen for her Sleep Apnea.  Called pt left message..Abigail Edwards

## 2013-12-13 LAB — FECAL OCCULT BLOOD, IMMUNOCHEMICAL: Fecal Occult Bld: NEGATIVE

## 2013-12-13 NOTE — Telephone Encounter (Signed)
Called Abigail Edwards, scheduled an appointment for Nov 3,20155 at 2:00pm. Left a message on her voice message..[name]

## 2013-12-13 NOTE — Telephone Encounter (Signed)
Spoke with patient and she cannot make 11/3. Rescheduled to 11/17. Patient aware.

## 2013-12-14 ENCOUNTER — Telehealth: Payer: Self-pay | Admitting: Internal Medicine

## 2013-12-14 NOTE — Telephone Encounter (Signed)
[employer] Certificate of Med Necessity Form is not in the Clinic Folder for next appts..[name]

## 2013-12-14 NOTE — Telephone Encounter (Signed)
ok 

## 2013-12-14 NOTE — Telephone Encounter (Signed)
Called pt to confirm appt again.  No answer...cdavis

## 2013-12-18 ENCOUNTER — Encounter: Payer: Self-pay | Admitting: *Deleted

## 2013-12-19 DIAGNOSIS — H3532 Exudative age-related macular degeneration: Secondary | ICD-10-CM | POA: Diagnosis not present

## 2013-12-20 ENCOUNTER — Telehealth: Payer: Self-pay | Admitting: *Deleted

## 2013-12-20 NOTE — Telephone Encounter (Signed)
Miranda with [employer] called and wanted a verbal order to replace patient's O2 concentrator because it has been over 5 years. Verbal given to replace.

## 2013-12-25 ENCOUNTER — Encounter: Payer: Self-pay | Admitting: Internal Medicine

## 2013-12-25 ENCOUNTER — Telehealth: Payer: Self-pay | Admitting: Internal Medicine

## 2013-12-25 ENCOUNTER — Ambulatory Visit: Payer: Medicare Other | Admitting: Internal Medicine

## 2013-12-25 DIAGNOSIS — D751 Secondary polycythemia: Secondary | ICD-10-CM | POA: Insufficient documentation

## 2013-12-25 DIAGNOSIS — R0902 Hypoxemia: Secondary | ICD-10-CM | POA: Insufficient documentation

## 2013-12-25 NOTE — Telephone Encounter (Signed)
Pt need Oxygen, she needs her levels checked per Dr. Bubba Camp.  Called patient- left a message to come into the office for a Nurse visit on tomorrow...cdavis [employer] Certificate of Medical Necessity on my desk..[name]

## 2013-12-26 NOTE — Telephone Encounter (Signed)
Patient called and Left message that she cannot come in today for the O2 Form. Stated that her son cannot bring her today and she prefers to wait until her appointment on the 17th if that was ok. I spoke with Dr. Bubba Camp and she stated as long as the patient is ok with it that was fine. I called patient back and had to left message on phone with what Dr. Bubba Camp stated and to call back if any concerns. Caren Griffins Notified and she will place forms in folder at [facility] area.

## 2014-01-08 ENCOUNTER — Encounter: Payer: Self-pay | Admitting: Internal Medicine

## 2014-01-08 ENCOUNTER — Ambulatory Visit (INDEPENDENT_AMBULATORY_CARE_PROVIDER_SITE_OTHER): Payer: Medicare Other | Admitting: Internal Medicine

## 2014-01-08 VITALS — BP 124/78 | HR 66 | Temp 98.0°F | Resp 10 | Ht 66.5 in | Wt 177.0 lb

## 2014-01-08 DIAGNOSIS — D751 Secondary polycythemia: Secondary | ICD-10-CM | POA: Diagnosis not present

## 2014-01-08 DIAGNOSIS — G4730 Sleep apnea, unspecified: Secondary | ICD-10-CM

## 2014-01-08 MED ORDER — HYDROCODONE-ACETAMINOPHEN 10-325 MG PO TABS: ORAL_TABLET | ORAL | Status: DC
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
## 2014-01-10 ENCOUNTER — Telehealth: Payer: Self-pay | Admitting: *Deleted

## 2014-01-10 NOTE — Telephone Encounter (Signed)
noted 

## 2014-01-10 NOTE — Telephone Encounter (Signed)
Patient got a call from a the sleep apnea place and stated that they were going to send out the stuff for the patient to do the home sleep apnea test. They told her that Medicare would only pay 80% and she would be responsible for $60.00. Patient wanted to make you aware. Patient is going to check with [facility] to see about doing the Sleep Apnea instead.

## 2014-01-22 ENCOUNTER — Encounter: Payer: Self-pay | Admitting: Internal Medicine

## 2014-01-27 ENCOUNTER — Other Ambulatory Visit: Payer: Self-pay | Admitting: Internal Medicine

## 2014-02-07 ENCOUNTER — Other Ambulatory Visit: Payer: Self-pay | Admitting: *Deleted

## 2014-02-07 MED ORDER — HYDROCODONE-ACETAMINOPHEN 10-325 MG PO TABS: ORAL_TABLET | ORAL | Status: DC

## 2014-02-07 NOTE — Telephone Encounter (Signed)
Patient requested and will pick up 

## 2014-02-13 ENCOUNTER — Other Ambulatory Visit: Payer: Self-pay | Admitting: Internal Medicine

## 2014-02-20 DIAGNOSIS — H3532 Exudative age-related macular degeneration: Secondary | ICD-10-CM | POA: Diagnosis not present

## 2014-02-20 DIAGNOSIS — H3531 Nonexudative age-related macular degeneration: Secondary | ICD-10-CM | POA: Diagnosis not present

## 2014-02-20 DIAGNOSIS — H2511 Age-related nuclear cataract, right eye: Secondary | ICD-10-CM | POA: Diagnosis not present

## 2014-02-20 DIAGNOSIS — Z961 Presence of intraocular lens: Secondary | ICD-10-CM | POA: Diagnosis not present

## 2014-02-20 DIAGNOSIS — E1165 Type 2 diabetes mellitus with hyperglycemia: Secondary | ICD-10-CM | POA: Diagnosis not present

## 2014-02-25 DIAGNOSIS — B351 Tinea unguium: Secondary | ICD-10-CM | POA: Diagnosis not present

## 2014-02-25 DIAGNOSIS — L84 Corns and callosities: Secondary | ICD-10-CM | POA: Diagnosis not present

## 2014-02-25 DIAGNOSIS — E1142 Type 2 diabetes mellitus with diabetic polyneuropathy: Secondary | ICD-10-CM | POA: Diagnosis not present

## 2014-03-05 ENCOUNTER — Telehealth: Payer: Self-pay | Admitting: *Deleted

## 2014-03-05 ENCOUNTER — Telehealth: Payer: Self-pay

## 2014-03-05 NOTE — Telephone Encounter (Signed)
Certificate of Medical Necessity was received from [place] (phone number [phone]), form was completed and placed on ledge (with last OV note from November) for further review/signature from [place], then to be faxed back to [phone]

## 2014-03-05 NOTE — Telephone Encounter (Signed)
Received [facility] Pulse Oximetry Testing Results for Continuous Overnight Monitoring given to Dr. Bubba Camp to review and sign.  Probe site: Finger Baseline: Room Air Time for Bed: 12:45 am Time awoke: 6:00am  Lowest Sat 86% patient was <88% for 0.4% and >90% for 97.7% while on room air.

## 2014-03-08 ENCOUNTER — Telehealth: Payer: Self-pay | Admitting: *Deleted

## 2014-03-08 ENCOUNTER — Encounter: Payer: Self-pay | Admitting: Internal Medicine

## 2014-03-08 NOTE — Telephone Encounter (Signed)
Filled out CMN form from [place] for Oxygen. Given to Dr. Bubba Camp to review and sign.

## 2014-03-11 ENCOUNTER — Other Ambulatory Visit: Payer: Self-pay | Admitting: *Deleted

## 2014-03-11 MED ORDER — HYDROCODONE-ACETAMINOPHEN 10-325 MG PO TABS: ORAL_TABLET | ORAL | Status: DC

## 2014-03-11 NOTE — Telephone Encounter (Signed)
Patient requested and will pick up 

## 2014-03-25 ENCOUNTER — Other Ambulatory Visit: Payer: Self-pay | Admitting: Internal Medicine

## 2014-04-11 ENCOUNTER — Other Ambulatory Visit: Payer: Self-pay | Admitting: *Deleted

## 2014-04-11 MED ORDER — HYDROCODONE-ACETAMINOPHEN 10-325 MG PO TABS: ORAL_TABLET | ORAL | Status: DC

## 2014-04-11 NOTE — Telephone Encounter (Signed)
Patient Requested and will pick up 

## 2014-04-24 ENCOUNTER — Other Ambulatory Visit: Payer: Self-pay

## 2014-04-24 DIAGNOSIS — F32A Depression, unspecified: Secondary | ICD-10-CM

## 2014-04-24 DIAGNOSIS — F329 Major depressive disorder, single episode, unspecified: Secondary | ICD-10-CM

## 2014-04-24 MED ORDER — CITALOPRAM HYDROBROMIDE 40 MG PO TABS: ORAL_TABLET | ORAL | Status: DC

## 2014-05-07 ENCOUNTER — Encounter: Payer: Self-pay | Admitting: Internal Medicine

## 2014-05-07 ENCOUNTER — Ambulatory Visit (INDEPENDENT_AMBULATORY_CARE_PROVIDER_SITE_OTHER): Payer: Medicare Other | Admitting: Internal Medicine

## 2014-05-07 VITALS — BP 126/68 | HR 62 | Temp 97.7°F | Resp 20 | Ht 67.0 in | Wt 172.8 lb

## 2014-05-07 DIAGNOSIS — E039 Hypothyroidism, unspecified: Secondary | ICD-10-CM

## 2014-05-07 DIAGNOSIS — I10 Essential (primary) hypertension: Secondary | ICD-10-CM

## 2014-05-07 DIAGNOSIS — F1190 Opioid use, unspecified, uncomplicated: Secondary | ICD-10-CM

## 2014-05-07 DIAGNOSIS — F329 Major depressive disorder, single episode, unspecified: Secondary | ICD-10-CM | POA: Diagnosis not present

## 2014-05-07 DIAGNOSIS — F32A Depression, unspecified: Secondary | ICD-10-CM

## 2014-05-07 DIAGNOSIS — G8929 Other chronic pain: Secondary | ICD-10-CM

## 2014-05-07 DIAGNOSIS — M545 Low back pain, unspecified: Secondary | ICD-10-CM

## 2014-05-07 DIAGNOSIS — K219 Gastro-esophageal reflux disease without esophagitis: Secondary | ICD-10-CM

## 2014-05-07 DIAGNOSIS — E785 Hyperlipidemia, unspecified: Secondary | ICD-10-CM

## 2014-05-07 DIAGNOSIS — Z23 Encounter for immunization: Secondary | ICD-10-CM

## 2014-05-07 MED ORDER — HYDROCODONE-ACETAMINOPHEN 10-325 MG PO TABS: 1.0000 | ORAL_TABLET | Freq: Three times a day (TID) | ORAL | Status: DC | PRN

## 2014-05-07 MED ORDER — METHOCARBAMOL 500 MG PO TABS: 500.0000 mg | ORAL_TABLET | Freq: Two times a day (BID) | ORAL | Status: DC | PRN

## 2014-05-07 MED ORDER — GABAPENTIN 300 MG PO CAPS: 300.0000 mg | ORAL_CAPSULE | Freq: Every day | ORAL | Status: DC

## 2014-05-07 MED ORDER — RANITIDINE HCL 150 MG PO TABS: 150.0000 mg | ORAL_TABLET | Freq: Every day | ORAL | Status: DC | PRN

## 2014-05-07 MED ORDER — CITALOPRAM HYDROBROMIDE 20 MG PO TABS: ORAL_TABLET | ORAL | Status: DC
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
## 2014-05-08 ENCOUNTER — Other Ambulatory Visit: Payer: Self-pay | Admitting: Internal Medicine

## 2014-05-08 LAB — DRUG SCREEN, URINE
Amphetamines, Urine: NEGATIVE ng/mL
Barbiturate screen, urine: NEGATIVE ng/mL
Benzodiazepine Quant, Ur: NEGATIVE ng/mL
Cannabinoid Quant, Ur: NEGATIVE ng/mL
Cocaine (Metab.): NEGATIVE ng/mL
Opiate Quant, Ur: POSITIVE ng/mL
PCP Quant, Ur: NEGATIVE ng/mL

## 2014-05-08 LAB — COMPREHENSIVE METABOLIC PANEL
ALT: 11 IU/L (ref 0–32)
AST: 16 IU/L (ref 0–40)
Albumin/Globulin Ratio: 1.6 (ref 1.1–2.5)
Albumin: 4 g/dL (ref 3.5–4.8)
Alkaline Phosphatase: 75 IU/L (ref 39–117)
BUN/Creatinine Ratio: 9 — ABNORMAL LOW (ref 11–26)
BUN: 6 mg/dL — ABNORMAL LOW (ref 8–27)
Bilirubin Total: 0.3 mg/dL (ref 0.0–1.2)
CO2: 27 mmol/L (ref 18–29)
Calcium: 9.1 mg/dL (ref 8.7–10.3)
Chloride: 93 mmol/L — ABNORMAL LOW (ref 97–108)
Creatinine, Ser: 0.68 mg/dL (ref 0.57–1.00)
GFR calc Af Amer: 98 mL/min/{1.73_m2} (ref >59)
GFR calc non Af Amer: 85 mL/min/{1.73_m2} (ref >59)
Globulin, Total: 2.5 g/dL (ref 1.5–4.5)
Glucose: 81 mg/dL (ref 65–99)
Potassium: 4.3 mmol/L (ref 3.5–5.2)
Sodium: 136 mmol/L (ref 134–144)
Total Protein: 6.5 g/dL (ref 6.0–8.5)

## 2014-05-08 LAB — LIPID PANEL
Chol/HDL Ratio: 5.1 ratio units — ABNORMAL HIGH (ref 0.0–4.4)
Cholesterol, Total: 214 mg/dL — ABNORMAL HIGH (ref 100–199)
HDL: 42 mg/dL (ref >39)
LDL Calculated: 132 mg/dL — ABNORMAL HIGH (ref 0–99)
Triglycerides: 199 mg/dL — ABNORMAL HIGH (ref 0–149)
VLDL Cholesterol Cal: 40 mg/dL (ref 5–40)

## 2014-05-08 LAB — TSH: TSH: 1.93 u[IU]/mL (ref 0.450–4.500)

## 2014-05-10 ENCOUNTER — Telehealth: Payer: Self-pay

## 2014-05-10 NOTE — Telephone Encounter (Signed)
Patient called back to get lab results, told her what Dr. Bubba Camp wrote about her labs. She was ok with this.

## 2014-05-10 NOTE — Telephone Encounter (Signed)
-----   Message from Blanchie Serve, MD sent at 05/08/2014  4:31 PM EDT ----- Your cholesterol level has worsened, will need to increase your rosuvastatin to 80 mg daily, take it every night. Also your urine drug screen shows the drug we tested for positive which means you are taking your medication as prescribed. Ok to give her refill on norco when due for 30 days. Rest of labs within normal range.

## 2014-05-13 DIAGNOSIS — L84 Corns and callosities: Secondary | ICD-10-CM | POA: Diagnosis not present

## 2014-05-13 DIAGNOSIS — E1142 Type 2 diabetes mellitus with diabetic polyneuropathy: Secondary | ICD-10-CM | POA: Diagnosis not present

## 2014-05-13 DIAGNOSIS — B351 Tinea unguium: Secondary | ICD-10-CM | POA: Diagnosis not present

## 2014-05-22 DIAGNOSIS — H3532 Exudative age-related macular degeneration: Secondary | ICD-10-CM | POA: Diagnosis not present

## 2014-05-22 DIAGNOSIS — E1165 Type 2 diabetes mellitus with hyperglycemia: Secondary | ICD-10-CM | POA: Diagnosis not present

## 2014-05-22 DIAGNOSIS — Z961 Presence of intraocular lens: Secondary | ICD-10-CM | POA: Diagnosis not present

## 2014-05-22 DIAGNOSIS — H2511 Age-related nuclear cataract, right eye: Secondary | ICD-10-CM | POA: Diagnosis not present

## 2014-05-22 DIAGNOSIS — H3531 Nonexudative age-related macular degeneration: Secondary | ICD-10-CM | POA: Diagnosis not present

## 2014-05-28 ENCOUNTER — Telehealth: Payer: Self-pay | Admitting: *Deleted

## 2014-05-28 NOTE — Telephone Encounter (Signed)
Received [employer] [facility] Form [phone] Fax#: [phone]. Filled out and given to Dr. Eulas Post to review and sign.

## 2014-06-03 ENCOUNTER — Other Ambulatory Visit: Payer: Self-pay | Admitting: Internal Medicine

## 2014-06-07 ENCOUNTER — Other Ambulatory Visit: Payer: Self-pay

## 2014-06-07 MED ORDER — HYDROCODONE-ACETAMINOPHEN 10-325 MG PO TABS: 1.0000 | ORAL_TABLET | Freq: Three times a day (TID) | ORAL | Status: DC | PRN

## 2014-06-07 NOTE — Telephone Encounter (Signed)
Patient called for refill on Hydrocodone, last refill 05/07/14, last appt Dr. Bubba Camp 05/07/14, next appt with Dr. Eulas Post 10/11/14. Her son Huey Romans will pick up Rx.

## 2014-06-13 ENCOUNTER — Other Ambulatory Visit: Payer: Self-pay | Admitting: Internal Medicine

## 2014-06-24 ENCOUNTER — Other Ambulatory Visit: Payer: Self-pay | Admitting: Internal Medicine

## 2014-07-11 ENCOUNTER — Other Ambulatory Visit: Payer: Self-pay | Admitting: *Deleted

## 2014-07-11 MED ORDER — HYDROCODONE-ACETAMINOPHEN 10-325 MG PO TABS: ORAL_TABLET | ORAL | Status: DC

## 2014-07-11 NOTE — Telephone Encounter (Signed)
Patient requested and son, Huey Romans will pick up

## 2014-07-16 ENCOUNTER — Other Ambulatory Visit: Payer: Self-pay | Admitting: Internal Medicine

## 2014-07-29 ENCOUNTER — Other Ambulatory Visit: Payer: Self-pay | Admitting: Internal Medicine

## 2014-08-13 ENCOUNTER — Other Ambulatory Visit: Payer: Self-pay | Admitting: *Deleted

## 2014-08-13 MED ORDER — HYDROCODONE-ACETAMINOPHEN 10-325 MG PO TABS: ORAL_TABLET | ORAL | Status: DC

## 2014-08-13 NOTE — Telephone Encounter (Signed)
Patient called and requested and Abigail Edwards, son will pick up

## 2014-08-21 DIAGNOSIS — E1165 Type 2 diabetes mellitus with hyperglycemia: Secondary | ICD-10-CM | POA: Diagnosis not present

## 2014-08-21 DIAGNOSIS — L84 Corns and callosities: Secondary | ICD-10-CM | POA: Diagnosis not present

## 2014-08-21 DIAGNOSIS — H2511 Age-related nuclear cataract, right eye: Secondary | ICD-10-CM | POA: Diagnosis not present

## 2014-08-21 DIAGNOSIS — H3531 Nonexudative age-related macular degeneration: Secondary | ICD-10-CM | POA: Diagnosis not present

## 2014-08-21 DIAGNOSIS — Z961 Presence of intraocular lens: Secondary | ICD-10-CM | POA: Diagnosis not present

## 2014-08-21 DIAGNOSIS — B351 Tinea unguium: Secondary | ICD-10-CM | POA: Diagnosis not present

## 2014-08-21 DIAGNOSIS — E1142 Type 2 diabetes mellitus with diabetic polyneuropathy: Secondary | ICD-10-CM | POA: Diagnosis not present

## 2014-08-21 DIAGNOSIS — H3532 Exudative age-related macular degeneration: Secondary | ICD-10-CM | POA: Diagnosis not present

## 2014-09-11 ENCOUNTER — Other Ambulatory Visit: Payer: Self-pay | Admitting: *Deleted

## 2014-09-11 MED ORDER — HYDROCODONE-ACETAMINOPHEN 10-325 MG PO TABS: ORAL_TABLET | ORAL | Status: DC

## 2014-09-11 NOTE — Telephone Encounter (Signed)
Patient requested and son, Legrand Como will pick up

## 2014-10-08 ENCOUNTER — Other Ambulatory Visit: Payer: Self-pay | Admitting: Internal Medicine

## 2014-10-11 ENCOUNTER — Encounter: Payer: Self-pay | Admitting: Internal Medicine

## 2014-10-11 ENCOUNTER — Ambulatory Visit (INDEPENDENT_AMBULATORY_CARE_PROVIDER_SITE_OTHER): Payer: Medicare Other | Admitting: Internal Medicine

## 2014-10-11 VITALS — BP 118/88 | HR 65 | Temp 97.9°F | Resp 20 | Ht 62.0 in | Wt 174.4 lb

## 2014-10-11 DIAGNOSIS — M8580 Other specified disorders of bone density and structure, unspecified site: Secondary | ICD-10-CM

## 2014-10-11 DIAGNOSIS — G629 Polyneuropathy, unspecified: Secondary | ICD-10-CM | POA: Diagnosis not present

## 2014-10-11 DIAGNOSIS — K219 Gastro-esophageal reflux disease without esophagitis: Secondary | ICD-10-CM

## 2014-10-11 DIAGNOSIS — F32A Depression, unspecified: Secondary | ICD-10-CM

## 2014-10-11 DIAGNOSIS — G8929 Other chronic pain: Secondary | ICD-10-CM | POA: Diagnosis not present

## 2014-10-11 DIAGNOSIS — E785 Hyperlipidemia, unspecified: Secondary | ICD-10-CM

## 2014-10-11 DIAGNOSIS — M545 Low back pain, unspecified: Secondary | ICD-10-CM

## 2014-10-11 DIAGNOSIS — Z0000 Encounter for general adult medical examination without abnormal findings: Secondary | ICD-10-CM | POA: Diagnosis not present

## 2014-10-11 DIAGNOSIS — D751 Secondary polycythemia: Secondary | ICD-10-CM | POA: Diagnosis not present

## 2014-10-11 DIAGNOSIS — I10 Essential (primary) hypertension: Secondary | ICD-10-CM

## 2014-10-11 DIAGNOSIS — F329 Major depressive disorder, single episode, unspecified: Secondary | ICD-10-CM

## 2014-10-11 DIAGNOSIS — E1149 Type 2 diabetes mellitus with other diabetic neurological complication: Secondary | ICD-10-CM | POA: Diagnosis not present

## 2014-10-11 DIAGNOSIS — E039 Hypothyroidism, unspecified: Secondary | ICD-10-CM | POA: Diagnosis not present

## 2014-10-11 MED ORDER — ZOSTER VACCINE LIVE 19400 UNT/0.65ML ~~LOC~~ SOLR: 0.6500 mL | Freq: Once | SUBCUTANEOUS | Status: DC

## 2014-10-11 MED ORDER — HYDROCODONE-ACETAMINOPHEN 10-325 MG PO TABS: ORAL_TABLET | ORAL | Status: DC

## 2014-10-11 NOTE — Progress Notes (Signed)
Failed clock test 

## 2014-10-11 NOTE — Patient Instructions (Addendum)
Encouraged her to exercise 30-45 minutes 4-5 times per week. Eat a well balanced diet. Avoid smoking. Limit alcohol intake. Wear seatbelt when riding in the car. Wear sun block (SPF >50) when spending extended times outside.  Continue current medications as ordered  Follow up in 4 mos for routine visit and as needed. Will call with lab results

## 2014-10-11 NOTE — Progress Notes (Signed)
Patient ID: Nonda Lou, female   DOB: 05/04/1934, 80 y.o.   MRN: [id number] Subjective:    JYLA HOPF is a 80 y.o. female who presents for Medicare Initial preventive examination. She still lives in [place]. She has been doing well overall. No concerns today. She sees eye specialist for OS macular degeneration and receives injections on monthly basis into OS. She is still driving and recently had license renewed  Preventive Screening-Counseling & Management  Tobacco History  Smoking status  . Never Smoker   Smokeless tobacco  . Never Used     Problems Prior to Visit 1. none  Current Problems (verified) Patient Active Problem List   Diagnosis Date Noted  . Polycythemia, secondary 12/25/2013  . Hypoxemia 12/25/2013  . Osteopenia 10/24/2013  . Cervical pain (neck) 10/24/2013  . Breast screening 10/24/2013  . Chronic low back pain 06/12/2013  . GERD (gastroesophageal reflux disease) 06/12/2013  . Acute neck pain 03/13/2013  . DM type 2 with diabetic peripheral neuropathy 03/13/2013  . Peripheral neuropathy 08/16/2012  . Osteoarthritis 08/16/2012  . Hypothyroidism   . DM (diabetes mellitus) type II controlled, neurological manifestation   . Hyperlipidemia with target LDL less than 100   . Hypokalemia   . Depression   . Hypertension   . Cataract due to DM 02/02/2011  . Macular degeneration (senile) of retina 02/02/2011    Medications Prior to Visit Current Outpatient Prescriptions on File Prior to Visit  Medication Sig Dispense Refill  . alendronate (FOSAMAX) 70 MG tablet Take 1 tablet (70 mg total) by mouth once a week. Take with a full glass of water on an empty stomach. 12 tablet 3  . Cholecalciferol (VITAMIN D3) 5000 UNITS CAPS Take 1 capsule by mouth daily.      . citalopram (CELEXA) 20 MG tablet TAKE 1 TABLET EVERY DAY 90 tablet 1  . gabapentin (NEURONTIN) 300 MG capsule Take 1 capsule (300 mg total) by mouth at bedtime. 90 capsule 3  . HYDROcodone-acetaminophen  (NORCO) 10-325 MG per tablet Take one tablet by mouth every 8 hours as needed for pain 90 tablet 0  . KLOR-CON M10 10 MEQ tablet TAKE 1 TABLET TWICE A DAY 180 tablet 1  . levothyroxine (SYNTHROID, LEVOTHROID) 125 MCG tablet TAKE 1 TABLET DAILY FOR THYROID 90 tablet 1  . methocarbamol (ROBAXIN) 500 MG tablet TAKE 1 TABLET TWICE A DAY BETWEEN MEALS AS NEEDED FOR MUSCLE SPASMS 30 tablet 3  . Multiple Vitamins-Minerals (PRESERVISION/LUTEIN PO) Take 1 tablet by mouth 2 (two) times daily.      . naproxen (NAPROSYN) 500 MG tablet TAKE 1 TABLET TWICE A DAY WITH A MEAL 30 tablet 5  . potassium chloride (K-DUR) 10 MEQ tablet Take 1 tablet (10 mEq total) by mouth 2 (two) times daily. 180 tablet 0  . ranitidine (ZANTAC) 150 MG tablet Take 1 tablet (150 mg total) by mouth daily as needed for heartburn. 1 by mouth daily 90 tablet 1  . rosuvastatin (CRESTOR) 40 MG tablet Take one tablet by mouth once daily for cholesterol 90 tablet 1  . triamterene-hydrochlorothiazide (MAXZIDE-25) 37.5-25 MG per tablet TAKE 1 TABLET BY MOUTH EVERY DAY FOR BLOOD PESSURE 90 tablet 1   No current facility-administered medications on file prior to visit.    Current Medications (verified) Current Outpatient Prescriptions  Medication Sig Dispense Refill  . alendronate (FOSAMAX) 70 MG tablet Take 1 tablet (70 mg total) by mouth once a week. Take with a full glass of water on an  empty stomach. 12 tablet 3  . Cholecalciferol (VITAMIN D3) 5000 UNITS CAPS Take 1 capsule by mouth daily.      . citalopram (CELEXA) 20 MG tablet TAKE 1 TABLET EVERY DAY 90 tablet 1  . gabapentin (NEURONTIN) 300 MG capsule Take 1 capsule (300 mg total) by mouth at bedtime. 90 capsule 3  . HYDROcodone-acetaminophen (NORCO) 10-325 MG per tablet Take one tablet by mouth every 8 hours as needed for pain 90 tablet 0  . KLOR-CON M10 10 MEQ tablet TAKE 1 TABLET TWICE A DAY 180 tablet 1  . levothyroxine (SYNTHROID, LEVOTHROID) 125 MCG tablet TAKE 1 TABLET DAILY FOR  THYROID 90 tablet 1  . methocarbamol (ROBAXIN) 500 MG tablet TAKE 1 TABLET TWICE A DAY BETWEEN MEALS AS NEEDED FOR MUSCLE SPASMS 30 tablet 3  . Multiple Vitamins-Minerals (PRESERVISION/LUTEIN PO) Take 1 tablet by mouth 2 (two) times daily.      . naproxen (NAPROSYN) 500 MG tablet TAKE 1 TABLET TWICE A DAY WITH A MEAL 30 tablet 5  . potassium chloride (K-DUR) 10 MEQ tablet Take 1 tablet (10 mEq total) by mouth 2 (two) times daily. 180 tablet 0  . ranitidine (ZANTAC) 150 MG tablet Take 1 tablet (150 mg total) by mouth daily as needed for heartburn. 1 by mouth daily 90 tablet 1  . rosuvastatin (CRESTOR) 40 MG tablet Take one tablet by mouth once daily for cholesterol 90 tablet 1  . triamterene-hydrochlorothiazide (MAXZIDE-25) 37.5-25 MG per tablet TAKE 1 TABLET BY MOUTH EVERY DAY FOR BLOOD PESSURE 90 tablet 1   No current facility-administered medications for this visit.     Allergies (verified) Valium; Morphine and related; and Penicillins   PAST HISTORY  Family History Family History  Problem Relation Age of Onset  . Anesthesia problems Neg Hx   . Heart disease Mother     CHF  . COPD Mother   . Cancer Father     liver   Past Surgical History  Procedure Laterality Date  . Tonsillectomy    . Cataract extraction w/phaco  05/04/2011    Procedure: CATARACT EXTRACTION PHACO AND INTRAOCULAR LENS PLACEMENT (IOC);  Surgeon: Hayden Pedro, MD;  Location: [facility];  Service: Ophthalmology;  Laterality: Left;  . Abdominal hysterectomy  1984  . Colonoscopy    . Carpal tunnel release Left 04/04/2012    Procedure: CARPAL TUNNEL RELEASE;  Surgeon: Hessie Dibble, MD;  Location: [facility];  Service: Orthopedics;  Laterality: Left;  . Cement in vertabrae      Social History Social History  Substance Use Topics  . Smoking status: Never Smoker   . Smokeless tobacco: Never Used  . Alcohol Use: Yes     Comment: rarley     Are there smokers in your home (other than you)? NA -  she is a nonsmoker  Risk Factors Current exercise habits: Exercise is limited by orthopedic condition(s): OA. she walks as tolerated Dietary issues discussed: healthy food choices  Cardiac risk factors: advanced age (older than 35 for men, 71 for women), diabetes mellitus, dyslipidemia, hypertension and obesity (BMI >= 30 kg/m2).  Depression Screen (Note: if answer to either of the following is "Yes", a more complete depression screening is indicated)   Over the past 2 weeks, have you felt down, depressed or hopeless? No   Over the past 2 weeks, have you felt little interest or pleasure in doing things? No   Have you lost interest or pleasure in daily life? No  Do you often feel hopeless? No   Do you cry easily over simple problems? No   Activities of Daily Living In your present state of health, do you have any difficulty performing the following activities?:  Driving? Yes due to vision - drives locally Managing money?  No Feeding yourself? No  Getting from bed to chair? No  Climbing a flight of stairs? yes Preparing food and eating?: no  Bathing or showering? No  Getting dressed: no  Getting to the toilet? No  Using the toilet:no  Moving around from place to place: no  In the past year have you fallen or had a near fall?:no   Are you sexually active?   no  Do you have more than one partner?  NA  Hearing Difficulties:  Do you often ask people to speak up or repeat themselves? No  Do you experience ringing or noises in your ears? no  Do you have difficulty understanding soft or whispered voices? No   Do you feel that you have a problem with memory? no  Do you often misplace items? no  Do you feel safe at home?  yes  Cognitive Testing    Advanced Directives have been discussed with the patient? Yes - educations material given. She desires to be DNR  List the Names of Other Physician/Practitioners you currently use: 1.  Ophthalmology 2.   Podiatry  Indicate any  recent Medical Services you may have received from other than [facility] providers in the past year (date may be approximate).  none  Immunization History  Administered Date(s) Administered  . Pneumococcal Conjugate-13 05/07/2014  . Pneumococcal Polysaccharide-23 05/05/2011  . Td 02/23/2011    Screening Tests Health Maintenance  Topic Date Due  . FOOT EXAM  10/05/1944  . OPHTHALMOLOGY EXAM  10/05/1944  . ZOSTAVAX  10/04/1996  . HEMOGLOBIN A1C  04/14/2014  . INFLUENZA VACCINE  02/23/2015 (Originally 09/23/2014)  . URINE MICROALBUMIN  11/13/2014  . COLONOSCOPY  02/23/2020  . TETANUS/TDAP  02/22/2021  . DEXA SCAN  Completed  . PNA vac Low Risk Adult  Completed    All answers were reviewed with the patient and necessary referrals were made:  Gildardo Cranker, DO   10/11/2014   History reviewed: allergies, current medications, past family history, past medical history, past social history, past surgical history and problem list  Review of Systems   Review of Systems  Constitutional: Negative for fever, chills and malaise/fatigue.  HENT: Negative for sore throat and tinnitus.   Eyes: Positive for blurred vision. Negative for double vision.       OS reduced vision  Respiratory: Negative for cough, shortness of breath and wheezing.   Cardiovascular: Negative for chest pain, palpitations, orthopnea and leg swelling.  Gastrointestinal: Negative for heartburn, nausea, vomiting, abdominal pain, diarrhea, constipation and blood in stool.  Genitourinary: Negative for dysuria, urgency, frequency and hematuria.  Musculoskeletal: Negative for myalgias, joint pain and falls.  Skin: Negative for rash.  Neurological: Positive for tingling. Negative for dizziness, tremors, sensory change, focal weakness, seizures, loss of consciousness, weakness and headaches.  Endo/Heme/Allergies: Negative for environmental allergies. Does not bruise/bleed easily.  Psychiatric/Behavioral: Negative for depression and  memory loss. The patient is not nervous/anxious and does not have insomnia.       Objective:     Vision by Snellen chart: right eye:20/100, left eye:20/200 with correction  Body mass index is 31.89 kg/(m^2). BP 118/88 mmHg  Pulse 65  Temp(Src) 97.9 F (36.6 C) (Oral)  Resp  20  Ht 5\' 2"  (1.575 m)  Wt 174 lb 6.4 oz (79.107 kg)  BMI 31.89 kg/m2  SpO2 95%  Physical Exam  Constitutional: She is oriented to person, place, and time and well-developed, well-nourished, and in no distress.  HENT:  Head: Normocephalic and atraumatic.  Right Ear: External ear normal.  Left Ear: External ear normal.  Mouth/Throat: Oropharynx is clear and moist. No oropharyngeal exudate.  Eyes: Conjunctivae and EOM are normal. Pupils are equal, round, and reactive to light. No scleral icterus.  Neck: Neck supple. Carotid bruit is not present. No tracheal deviation present. No thyromegaly present.  Cardiovascular: Normal rate, regular rhythm, normal heart sounds and intact distal pulses.  Exam reveals no gallop and no friction rub.   No murmur heard. Pulmonary/Chest: Effort normal and breath sounds normal. She has no wheezes. She has no rhonchi. She has no rales. She exhibits no tenderness. Right breast exhibits no inverted nipple, no mass, no nipple discharge, no skin change and no tenderness. Left breast exhibits no inverted nipple, no mass, no nipple discharge, no skin change and no tenderness. Breasts are symmetrical.  Abdominal: Soft. Bowel sounds are normal. She exhibits no distension and no mass. There is no hepatosplenomegaly. There is no tenderness. There is no rebound and no guarding. A hernia is present. Hernia confirmed positive in the ventral area (reducible).  Musculoskeletal: She exhibits edema and tenderness.  Gait unsteady. No kyphosis  Lymphadenopathy:    She has no cervical adenopathy.  Neurological: She is alert and oriented to person, place, and time. She has normal reflexes. Gait normal.   Skin: Skin is warm and dry. No rash noted.  Psychiatric: Mood, memory, affect and judgment normal.   Diabetic Foot Exam - Simple   Simple Foot Form  Diabetic Foot exam was performed with the following findings:  Yes 10/11/2014  3:06 PM  Visual Inspection  Sensation Testing  Intact to touch and monofilament testing bilaterally:  Yes  Pulse Check  Posterior Tibialis and Dorsalis pulse intact bilaterally:  Yes  Comments  1st left toenail thick and dystrophic appearing. No calluses or ulcerations. No deformities     MMSE - Mini Mental State Exam 10/11/2014  Orientation to time 5  Orientation to Place 5  Registration 3  Attention/ Calculation 5  Recall 3  Language- name 2 objects 2  Language- repeat 1  Language- follow 3 step command 3  Language- read & follow direction 1  Write a sentence 0  Copy design 1  Total score 29            Assessment:        ICD-9-CM ICD-10-CM   1. Well adult exam V70.0 Z00.00   2. DM (diabetes mellitus) type II controlled, neurological manifestation - stable 250.60 E11.49 CBC with Differential     CMP     Hemoglobin A1c     Microalbumin/Creatinine Ratio, Urine     Urinalysis with Reflex Microscopic  3. Essential hypertension - controlled 401.9 I10 CBC with Differential  4. Hypothyroidism, unspecified hypothyroidism type - stable 244.9 E03.9 TSH     T4, Free  5. Hyperlipidemia with target LDL less than 100 - stable 272.4 E78.5 Lipid Panel  6. Chronic low back pain - stable 724.2 M54.5    338.29 G89.29   7. Depression - stable 311 F32.9 CMP  8. Gastroesophageal reflux disease, esophagitis presence not specified - stable 530.81 K21.9   9. Polycythemia, secondary - stable 289.0 D75.1 CBC with Differential  10.  Osteopenia - stable 733.90 M85.80 Vitamin D, 25-hydroxy         Plan:     During the course of the visit the patient was educated and counseled about appropriate screening and preventive services including:    Influenza  vaccine - reoommend in Fall 2016  Screening electrocardiogram - not done. She had one in Sept 2015  Advanced directives: has NO advanced directive  - add't info requested. Referral to SW: no given living will material  Diet review for nutrition referral? Yes ____  Not Indicated _x___   Patient Instructions (the written plan) was given to the patient.  Medicare Attestation I have personally reviewed: The patient's medical and social history Their use of alcohol, tobacco or illicit drugs Their current medications and supplements The patient's functional ability including ADLs,fall risks, home safety risks, cognitive, and hearing and visual impairment Diet and physical activities Evidence for depression or mood disorders  The patient's weight, height, BMI, and visual acuity have been recorded in the chart.  I have made referrals, counseling, and provided education to the patient based on review of the above and I have provided the patient with a written personalized care plan for preventive services.    Pt is UTD on health maintenance. Vaccinations are not UTD. rx given to have zostavax at local pharmacy. Pt maintains a healthy lifestyle. Encouraged pt to exercise 30-45 minutes 4-5 times per week. Eat a well balanced diet. Avoid smoking. Limit alcohol intake. Wear seatbelt when riding in the car. Wear sun block (SPF >50) when spending extended times outside.  Continue current medications as ordered  Keep appt with specialists as scheduled  Follow up in 4 mos for routine visit and as needed. Will need cologuard at that time as last colon cancer screen was in Oct/Nov of 2015.   Gildardo Cranker, DO   10/11/2014   Matvey Llanas S. Perlie Gold  [facility] [address] [place], [place] [address] [phone] Cell (Monday-Friday 8 AM - 5 PM) [phone] After 5 PM and follow prompts

## 2014-10-12 LAB — COMPREHENSIVE METABOLIC PANEL
ALT: 11 IU/L (ref 0–32)
AST: 15 IU/L (ref 0–40)
Albumin/Globulin Ratio: 1.8 (ref 1.1–2.5)
Albumin: 4.1 g/dL (ref 3.5–4.8)
Alkaline Phosphatase: 83 IU/L (ref 39–117)
BUN/Creatinine Ratio: 13 (ref 11–26)
BUN: 8 mg/dL (ref 8–27)
Bilirubin Total: 0.3 mg/dL (ref 0.0–1.2)
CO2: 29 mmol/L (ref 18–29)
Calcium: 9.2 mg/dL (ref 8.7–10.3)
Chloride: 90 mmol/L — ABNORMAL LOW (ref 97–108)
Creatinine, Ser: 0.63 mg/dL (ref 0.57–1.00)
GFR calc Af Amer: 99 mL/min/{1.73_m2} (ref >59)
GFR calc non Af Amer: 86 mL/min/{1.73_m2} (ref >59)
Globulin, Total: 2.3 g/dL (ref 1.5–4.5)
Glucose: 74 mg/dL (ref 65–99)
Potassium: 3.7 mmol/L (ref 3.5–5.2)
Sodium: 135 mmol/L (ref 134–144)
Total Protein: 6.4 g/dL (ref 6.0–8.5)

## 2014-10-12 LAB — URINALYSIS, ROUTINE W REFLEX MICROSCOPIC
Bilirubin, UA: NEGATIVE
Glucose, UA: NEGATIVE
Ketones, UA: NEGATIVE
Leukocytes, UA: NEGATIVE
Nitrite, UA: NEGATIVE
Protein, UA: NEGATIVE
RBC, UA: NEGATIVE
Specific Gravity, UA: <=1.005 — AB (ref 1.005–1.030)
Urobilinogen, Ur: 0.2 mg/dL (ref 0.2–1.0)
pH, UA: 7.5 (ref 5.0–7.5)

## 2014-10-12 LAB — LIPID PANEL
Chol/HDL Ratio: 5.5 ratio units — ABNORMAL HIGH (ref 0.0–4.4)
Cholesterol, Total: 220 mg/dL — ABNORMAL HIGH (ref 100–199)
HDL: 40 mg/dL (ref >39)
LDL Calculated: 138 mg/dL — ABNORMAL HIGH (ref 0–99)
Triglycerides: 208 mg/dL — ABNORMAL HIGH (ref 0–149)
VLDL Cholesterol Cal: 42 mg/dL — ABNORMAL HIGH (ref 5–40)

## 2014-10-12 LAB — CBC WITH DIFFERENTIAL/PLATELET
Basophils Absolute: 0 10*3/uL (ref 0.0–0.2)
Basos: 0 %
EOS (ABSOLUTE): 1.1 10*3/uL — ABNORMAL HIGH (ref 0.0–0.4)
Eos: 10 %
Hematocrit: 37.7 % (ref 34.0–46.6)
Hemoglobin: 11.7 g/dL (ref 11.1–15.9)
Immature Grans (Abs): 0 10*3/uL (ref 0.0–0.1)
Immature Granulocytes: 0 %
Lymphocytes Absolute: 3.2 10*3/uL — ABNORMAL HIGH (ref 0.7–3.1)
Lymphs: 27 %
MCH: 23.3 pg — ABNORMAL LOW (ref 26.6–33.0)
MCHC: 31 g/dL — ABNORMAL LOW (ref 31.5–35.7)
MCV: 75 fL — ABNORMAL LOW (ref 79–97)
Monocytes Absolute: 1.3 10*3/uL — ABNORMAL HIGH (ref 0.1–0.9)
Monocytes: 11 %
Neutrophils Absolute: 6 10*3/uL (ref 1.4–7.0)
Neutrophils: 52 %
Platelets: 356 10*3/uL (ref 150–379)
RBC: 5.03 x10E6/uL (ref 3.77–5.28)
RDW: 16 % — ABNORMAL HIGH (ref 12.3–15.4)
WBC: 11.6 10*3/uL — ABNORMAL HIGH (ref 3.4–10.8)

## 2014-10-12 LAB — MICROALBUMIN / CREATININE URINE RATIO
Creatinine, Urine: 22.1 mg/dL
MICROALB/CREAT RATIO: <13.6 mg/g creat (ref 0.0–30.0)
Microalbumin, Urine: <3 ug/mL

## 2014-10-12 LAB — HEMOGLOBIN A1C
Est. average glucose Bld gHb Est-mCnc: 126 mg/dL
Hgb A1c MFr Bld: 6 % — ABNORMAL HIGH (ref 4.8–5.6)

## 2014-10-12 LAB — T4, FREE: Free T4: 1.23 ng/dL (ref 0.82–1.77)

## 2014-10-12 LAB — TSH: TSH: 1.68 u[IU]/mL (ref 0.450–4.500)

## 2014-10-12 LAB — VITAMIN D 25 HYDROXY (VIT D DEFICIENCY, FRACTURES): Vit D, 25-Hydroxy: 30.1 ng/mL (ref 30.0–100.0)

## 2014-10-22 ENCOUNTER — Other Ambulatory Visit: Payer: Self-pay | Admitting: Internal Medicine

## 2014-11-11 ENCOUNTER — Other Ambulatory Visit: Payer: Self-pay

## 2014-11-11 ENCOUNTER — Other Ambulatory Visit: Payer: Self-pay | Admitting: Internal Medicine

## 2014-11-11 MED ORDER — HYDROCODONE-ACETAMINOPHEN 10-325 MG PO TABS: ORAL_TABLET | ORAL | Status: DC

## 2014-11-20 DIAGNOSIS — E1142 Type 2 diabetes mellitus with diabetic polyneuropathy: Secondary | ICD-10-CM | POA: Diagnosis not present

## 2014-11-20 DIAGNOSIS — H3532 Exudative age-related macular degeneration: Secondary | ICD-10-CM | POA: Diagnosis not present

## 2014-11-20 DIAGNOSIS — L84 Corns and callosities: Secondary | ICD-10-CM | POA: Diagnosis not present

## 2014-11-20 DIAGNOSIS — E1165 Type 2 diabetes mellitus with hyperglycemia: Secondary | ICD-10-CM | POA: Diagnosis not present

## 2014-11-20 DIAGNOSIS — H2511 Age-related nuclear cataract, right eye: Secondary | ICD-10-CM | POA: Diagnosis not present

## 2014-11-20 DIAGNOSIS — H3531 Nonexudative age-related macular degeneration: Secondary | ICD-10-CM | POA: Diagnosis not present

## 2014-11-20 DIAGNOSIS — B351 Tinea unguium: Secondary | ICD-10-CM | POA: Diagnosis not present

## 2014-11-20 DIAGNOSIS — Z961 Presence of intraocular lens: Secondary | ICD-10-CM | POA: Diagnosis not present

## 2014-11-23 DIAGNOSIS — H353210 Exudative age-related macular degeneration, right eye, stage unspecified: Secondary | ICD-10-CM | POA: Diagnosis not present

## 2014-12-06 ENCOUNTER — Other Ambulatory Visit: Payer: Self-pay | Admitting: Internal Medicine

## 2014-12-10 ENCOUNTER — Other Ambulatory Visit: Payer: Self-pay | Admitting: *Deleted

## 2014-12-10 MED ORDER — HYDROCODONE-ACETAMINOPHEN 10-325 MG PO TABS: ORAL_TABLET | ORAL | Status: DC

## 2014-12-10 NOTE — Telephone Encounter (Signed)
Patient called and Requested, son will pick up

## 2014-12-24 ENCOUNTER — Other Ambulatory Visit: Payer: Self-pay | Admitting: *Deleted

## 2014-12-25 ENCOUNTER — Other Ambulatory Visit: Payer: Self-pay | Admitting: Internal Medicine

## 2014-12-26 ENCOUNTER — Telehealth: Payer: Self-pay

## 2014-12-26 MED ORDER — CITALOPRAM HYDROBROMIDE 40 MG PO TABS: 40.0000 mg | ORAL_TABLET | Freq: Every day | ORAL | Status: DC

## 2014-12-26 NOTE — Telephone Encounter (Signed)
Patient called back stating she is taking 40 mg, would like 90 day supply at a time. RX sent

## 2014-12-26 NOTE — Telephone Encounter (Signed)
Left message on voicemail for patient to return call when available, reason for call- clarify which dose of citalopram patient is taking. Refill received from [facility] [place] [id number].

## 2014-12-30 ENCOUNTER — Other Ambulatory Visit: Payer: Self-pay | Admitting: Internal Medicine

## 2015-01-09 ENCOUNTER — Other Ambulatory Visit: Payer: Self-pay | Admitting: *Deleted

## 2015-01-09 MED ORDER — HYDROCODONE-ACETAMINOPHEN 10-325 MG PO TABS: ORAL_TABLET | ORAL | Status: DC

## 2015-01-09 NOTE — Telephone Encounter (Signed)
Patient requested and son, Michael will pick up 

## 2015-01-24 ENCOUNTER — Encounter: Payer: Self-pay | Admitting: Internal Medicine

## 2015-02-07 ENCOUNTER — Other Ambulatory Visit: Payer: Self-pay | Admitting: *Deleted

## 2015-02-07 MED ORDER — HYDROCODONE-ACETAMINOPHEN 10-325 MG PO TABS: ORAL_TABLET | ORAL | Status: DC

## 2015-02-07 NOTE — Telephone Encounter (Signed)
Patient requested and son, Michael will pick up 

## 2015-02-12 ENCOUNTER — Encounter: Payer: Self-pay | Admitting: Internal Medicine

## 2015-02-12 ENCOUNTER — Ambulatory Visit (INDEPENDENT_AMBULATORY_CARE_PROVIDER_SITE_OTHER): Payer: Medicare Other | Admitting: Internal Medicine

## 2015-02-12 VITALS — BP 112/68 | HR 62 | Temp 97.6°F | Resp 20 | Ht 62.0 in | Wt 180.0 lb

## 2015-02-12 DIAGNOSIS — M545 Low back pain, unspecified: Secondary | ICD-10-CM

## 2015-02-12 DIAGNOSIS — F329 Major depressive disorder, single episode, unspecified: Secondary | ICD-10-CM

## 2015-02-12 DIAGNOSIS — H6501 Acute serous otitis media, right ear: Secondary | ICD-10-CM

## 2015-02-12 DIAGNOSIS — L409 Psoriasis, unspecified: Secondary | ICD-10-CM | POA: Diagnosis not present

## 2015-02-12 DIAGNOSIS — K219 Gastro-esophageal reflux disease without esophagitis: Secondary | ICD-10-CM

## 2015-02-12 DIAGNOSIS — G8929 Other chronic pain: Secondary | ICD-10-CM | POA: Diagnosis not present

## 2015-02-12 DIAGNOSIS — E785 Hyperlipidemia, unspecified: Secondary | ICD-10-CM | POA: Diagnosis not present

## 2015-02-12 DIAGNOSIS — E1140 Type 2 diabetes mellitus with diabetic neuropathy, unspecified: Secondary | ICD-10-CM | POA: Diagnosis not present

## 2015-02-12 DIAGNOSIS — R42 Dizziness and giddiness: Secondary | ICD-10-CM

## 2015-02-12 DIAGNOSIS — I10 Essential (primary) hypertension: Secondary | ICD-10-CM | POA: Diagnosis not present

## 2015-02-12 DIAGNOSIS — H6123 Impacted cerumen, bilateral: Secondary | ICD-10-CM

## 2015-02-12 DIAGNOSIS — E039 Hypothyroidism, unspecified: Secondary | ICD-10-CM | POA: Diagnosis not present

## 2015-02-12 DIAGNOSIS — F32A Depression, unspecified: Secondary | ICD-10-CM

## 2015-02-12 MED ORDER — CEFUROXIME AXETIL 250 MG PO TABS: 250.0000 mg | ORAL_TABLET | Freq: Two times a day (BID) | ORAL | Status: DC

## 2015-02-12 MED ORDER — CLOBETASOL PROPIONATE 0.05 % EX FOAM: Freq: Two times a day (BID) | CUTANEOUS | Status: DC
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
## 2015-02-12 NOTE — Patient Instructions (Addendum)
Start cobetasol foam to apply twice daily for 2 weeks then off 2 weeks for scalp psoriasis  Continue other medications as ordered  Will call with lab results  Start probiotic daily while on antibiotic.  Follow up in 4 mos for routine visit

## 2015-02-13 LAB — COMPREHENSIVE METABOLIC PANEL
ALT: 8 IU/L (ref 0–32)
AST: 14 IU/L (ref 0–40)
Albumin/Globulin Ratio: 1.6 (ref 1.1–2.5)
Albumin: 3.9 g/dL (ref 3.5–4.8)
Alkaline Phosphatase: 74 IU/L (ref 39–117)
BUN/Creatinine Ratio: 13 (ref 11–26)
BUN: 9 mg/dL (ref 8–27)
Bilirubin Total: 0.3 mg/dL (ref 0.0–1.2)
CO2: 29 mmol/L (ref 18–29)
Calcium: 9 mg/dL (ref 8.7–10.3)
Chloride: 94 mmol/L — ABNORMAL LOW (ref 96–106)
Creatinine, Ser: 0.7 mg/dL (ref 0.57–1.00)
GFR calc Af Amer: 96 mL/min/{1.73_m2} (ref >59)
GFR calc non Af Amer: 83 mL/min/{1.73_m2} (ref >59)
Globulin, Total: 2.4 g/dL (ref 1.5–4.5)
Glucose: 78 mg/dL (ref 65–99)
Potassium: 4.1 mmol/L (ref 3.5–5.2)
Sodium: 136 mmol/L (ref 134–144)
Total Protein: 6.3 g/dL (ref 6.0–8.5)

## 2015-02-13 LAB — LIPID PANEL
Chol/HDL Ratio: 5.3 ratio units — ABNORMAL HIGH (ref 0.0–4.4)
Cholesterol, Total: 208 mg/dL — ABNORMAL HIGH (ref 100–199)
HDL: 39 mg/dL — ABNORMAL LOW (ref >39)
LDL Calculated: 130 mg/dL — ABNORMAL HIGH (ref 0–99)
Triglycerides: 196 mg/dL — ABNORMAL HIGH (ref 0–149)
VLDL Cholesterol Cal: 39 mg/dL (ref 5–40)

## 2015-02-13 LAB — HEMOGLOBIN A1C
Est. average glucose Bld gHb Est-mCnc: 131 mg/dL
Hgb A1c MFr Bld: 6.2 % — ABNORMAL HIGH (ref 4.8–5.6)

## 2015-02-13 LAB — TSH: TSH: 2.3 u[IU]/mL (ref 0.450–4.500)

## 2015-02-19 DIAGNOSIS — B351 Tinea unguium: Secondary | ICD-10-CM | POA: Diagnosis not present

## 2015-02-19 DIAGNOSIS — H353112 Nonexudative age-related macular degeneration, right eye, intermediate dry stage: Secondary | ICD-10-CM | POA: Diagnosis not present

## 2015-02-19 DIAGNOSIS — H353221 Exudative age-related macular degeneration, left eye, with active choroidal neovascularization: Secondary | ICD-10-CM | POA: Diagnosis not present

## 2015-02-19 DIAGNOSIS — H2511 Age-related nuclear cataract, right eye: Secondary | ICD-10-CM | POA: Diagnosis not present

## 2015-02-19 DIAGNOSIS — E1142 Type 2 diabetes mellitus with diabetic polyneuropathy: Secondary | ICD-10-CM | POA: Diagnosis not present

## 2015-02-19 DIAGNOSIS — E1165 Type 2 diabetes mellitus with hyperglycemia: Secondary | ICD-10-CM | POA: Diagnosis not present

## 2015-02-19 DIAGNOSIS — L84 Corns and callosities: Secondary | ICD-10-CM | POA: Diagnosis not present

## 2015-02-19 DIAGNOSIS — Z961 Presence of intraocular lens: Secondary | ICD-10-CM | POA: Diagnosis not present

## 2015-02-23 ENCOUNTER — Other Ambulatory Visit: Payer: Self-pay | Admitting: Internal Medicine

## 2015-03-10 ENCOUNTER — Other Ambulatory Visit: Payer: Self-pay | Admitting: *Deleted

## 2015-03-10 DIAGNOSIS — L409 Psoriasis, unspecified: Secondary | ICD-10-CM

## 2015-03-10 MED ORDER — CLOBETASOL PROPIONATE 0.05 % EX SOLN: 1.0000 "application " | Freq: Two times a day (BID) | CUTANEOUS | Status: DC

## 2015-03-10 MED ORDER — HYDROCODONE-ACETAMINOPHEN 10-325 MG PO TABS: ORAL_TABLET | ORAL | Status: DC

## 2015-03-10 NOTE — Telephone Encounter (Signed)
Patient requested and Abigail Edwards will pick up Patient requested Clobetasol Liquid instead of foam due to applicator not working on the bottle. Pharmacy recommended the liquid instead. Faxed new Rx.

## 2015-03-27 ENCOUNTER — Other Ambulatory Visit: Payer: Self-pay | Admitting: Internal Medicine

## 2015-04-09 ENCOUNTER — Other Ambulatory Visit: Payer: Self-pay | Admitting: *Deleted

## 2015-04-09 MED ORDER — HYDROCODONE-ACETAMINOPHEN 10-325 MG PO TABS: ORAL_TABLET | ORAL | Status: DC

## 2015-04-09 NOTE — Telephone Encounter (Signed)
Patient requested and Abigail Edwards, son will pick up

## 2015-04-22 ENCOUNTER — Other Ambulatory Visit: Payer: Self-pay | Admitting: Internal Medicine

## 2015-05-08 ENCOUNTER — Other Ambulatory Visit: Payer: Self-pay | Admitting: *Deleted

## 2015-05-08 MED ORDER — HYDROCODONE-ACETAMINOPHEN 10-325 MG PO TABS: ORAL_TABLET | ORAL | Status: DC

## 2015-05-08 NOTE — Telephone Encounter (Signed)
Patient requested and son will pick up 

## 2015-05-21 DIAGNOSIS — H353221 Exudative age-related macular degeneration, left eye, with active choroidal neovascularization: Secondary | ICD-10-CM | POA: Diagnosis not present

## 2015-05-21 DIAGNOSIS — L84 Corns and callosities: Secondary | ICD-10-CM | POA: Diagnosis not present

## 2015-05-21 DIAGNOSIS — B351 Tinea unguium: Secondary | ICD-10-CM | POA: Diagnosis not present

## 2015-05-21 DIAGNOSIS — E1142 Type 2 diabetes mellitus with diabetic polyneuropathy: Secondary | ICD-10-CM | POA: Diagnosis not present

## 2015-06-05 ENCOUNTER — Other Ambulatory Visit: Payer: Self-pay | Admitting: Internal Medicine

## 2015-06-05 ENCOUNTER — Other Ambulatory Visit: Payer: Self-pay | Admitting: *Deleted

## 2015-06-05 MED ORDER — HYDROCODONE-ACETAMINOPHEN 10-325 MG PO TABS: ORAL_TABLET | ORAL | Status: DC

## 2015-06-05 NOTE — Telephone Encounter (Signed)
Patient requested and will pick up 

## 2015-06-17 ENCOUNTER — Other Ambulatory Visit: Payer: Self-pay | Admitting: Internal Medicine

## 2015-06-18 ENCOUNTER — Telehealth: Payer: Self-pay

## 2015-06-18 ENCOUNTER — Ambulatory Visit: Payer: Medicare Other | Admitting: Internal Medicine

## 2015-06-18 NOTE — Telephone Encounter (Signed)
[facility] called to informed Dr.Carter of a possible contraindication with potassium 10 meq and triamterene-hydrochlorothiazide (MAXZIDE-25) 37.5-25 MG tablet. Risk of increased potassium values. Please advise if ok to continue with refills   Last OV 02/12/15, pending OV 06/25/15

## 2015-06-18 NOTE — Telephone Encounter (Signed)
Left message on voicemail for the pharmacy informing them that Dr.Carter is aware and would like medication continued

## 2015-06-18 NOTE — Telephone Encounter (Signed)
Aware of interaction. She has been taking this combo for some time. electrolytes followed closely. Cont meds as ordered

## 2015-06-25 ENCOUNTER — Ambulatory Visit: Payer: Medicare Other | Admitting: Internal Medicine

## 2015-07-04 ENCOUNTER — Other Ambulatory Visit: Payer: Self-pay | Admitting: *Deleted

## 2015-07-04 MED ORDER — HYDROCODONE-ACETAMINOPHEN 10-325 MG PO TABS: ORAL_TABLET | ORAL | Status: DC

## 2015-07-04 NOTE — Telephone Encounter (Signed)
Patient Requested and son will pick up

## 2015-07-04 NOTE — Telephone Encounter (Signed)
Patient son notified to pick up [phone]

## 2015-07-08 DIAGNOSIS — H2511 Age-related nuclear cataract, right eye: Secondary | ICD-10-CM | POA: Diagnosis not present

## 2015-07-08 DIAGNOSIS — H269 Unspecified cataract: Secondary | ICD-10-CM | POA: Diagnosis not present

## 2015-07-09 DIAGNOSIS — H353221 Exudative age-related macular degeneration, left eye, with active choroidal neovascularization: Secondary | ICD-10-CM | POA: Diagnosis not present

## 2015-08-01 ENCOUNTER — Encounter: Payer: Self-pay | Admitting: Internal Medicine

## 2015-08-01 ENCOUNTER — Ambulatory Visit (INDEPENDENT_AMBULATORY_CARE_PROVIDER_SITE_OTHER): Payer: Medicare Other | Admitting: Internal Medicine

## 2015-08-01 VITALS — BP 132/68 | HR 60 | Temp 98.3°F | Ht 62.0 in | Wt 167.8 lb

## 2015-08-01 DIAGNOSIS — I10 Essential (primary) hypertension: Secondary | ICD-10-CM

## 2015-08-01 DIAGNOSIS — E039 Hypothyroidism, unspecified: Secondary | ICD-10-CM

## 2015-08-01 DIAGNOSIS — K219 Gastro-esophageal reflux disease without esophagitis: Secondary | ICD-10-CM | POA: Diagnosis not present

## 2015-08-01 DIAGNOSIS — M545 Low back pain, unspecified: Secondary | ICD-10-CM

## 2015-08-01 DIAGNOSIS — E1140 Type 2 diabetes mellitus with diabetic neuropathy, unspecified: Secondary | ICD-10-CM | POA: Diagnosis not present

## 2015-08-01 DIAGNOSIS — F329 Major depressive disorder, single episode, unspecified: Secondary | ICD-10-CM | POA: Diagnosis not present

## 2015-08-01 DIAGNOSIS — E785 Hyperlipidemia, unspecified: Secondary | ICD-10-CM | POA: Diagnosis not present

## 2015-08-01 DIAGNOSIS — G8929 Other chronic pain: Secondary | ICD-10-CM

## 2015-08-01 DIAGNOSIS — M5431 Sciatica, right side: Secondary | ICD-10-CM | POA: Diagnosis not present

## 2015-08-01 DIAGNOSIS — F1190 Opioid use, unspecified, uncomplicated: Secondary | ICD-10-CM

## 2015-08-01 DIAGNOSIS — F32A Depression, unspecified: Secondary | ICD-10-CM

## 2015-08-01 MED ORDER — GABAPENTIN 300 MG PO CAPS: 300.0000 mg | ORAL_CAPSULE | Freq: Every day | ORAL | Status: DC

## 2015-08-01 MED ORDER — RANITIDINE HCL 150 MG PO TABS: 150.0000 mg | ORAL_TABLET | Freq: Every day | ORAL | Status: DC | PRN

## 2015-08-01 MED ORDER — HYDROCODONE-ACETAMINOPHEN 10-325 MG PO TABS: ORAL_TABLET | ORAL | Status: DC

## 2015-08-01 NOTE — Patient Instructions (Signed)
Continue current medications as ordered  Recommend xray of lumbar spine. Will call with results  Will call with lab results  Follow up with specialists as scheduled  Pain contract signed  Follow up in 6 mos for CPE
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
## 2015-08-02 LAB — COMPREHENSIVE METABOLIC PANEL
ALT: 8 IU/L (ref 0–32)
AST: 16 IU/L (ref 0–40)
Albumin/Globulin Ratio: 1.5 (ref 1.2–2.2)
Albumin: 3.9 g/dL (ref 3.5–4.8)
Alkaline Phosphatase: 90 IU/L (ref 39–117)
BUN/Creatinine Ratio: 15 (ref 12–28)
BUN: 9 mg/dL (ref 8–27)
Bilirubin Total: 0.2 mg/dL (ref 0.0–1.2)
CO2: 28 mmol/L (ref 18–29)
Calcium: 8.9 mg/dL (ref 8.7–10.3)
Chloride: 88 mmol/L — ABNORMAL LOW (ref 96–106)
Creatinine, Ser: 0.62 mg/dL (ref 0.57–1.00)
GFR calc Af Amer: 100 mL/min/{1.73_m2} (ref >59)
GFR calc non Af Amer: 87 mL/min/{1.73_m2} (ref >59)
Globulin, Total: 2.6 g/dL (ref 1.5–4.5)
Glucose: 82 mg/dL (ref 65–99)
Potassium: 4.4 mmol/L (ref 3.5–5.2)
Sodium: 130 mmol/L — ABNORMAL LOW (ref 134–144)
Total Protein: 6.5 g/dL (ref 6.0–8.5)

## 2015-08-02 LAB — HEMOGLOBIN A1C
Est. average glucose Bld gHb Est-mCnc: 114 mg/dL
Hgb A1c MFr Bld: 5.6 % (ref 4.8–5.6)

## 2015-08-02 LAB — TSH: TSH: 0.171 u[IU]/mL — ABNORMAL LOW (ref 0.450–4.500)

## 2015-08-02 LAB — LIPID PANEL
Chol/HDL Ratio: 4.7 ratio units — ABNORMAL HIGH (ref 0.0–4.4)
Cholesterol, Total: 185 mg/dL (ref 100–199)
HDL: 39 mg/dL — ABNORMAL LOW (ref >39)
LDL Calculated: 107 mg/dL — ABNORMAL HIGH (ref 0–99)
Triglycerides: 193 mg/dL — ABNORMAL HIGH (ref 0–149)
VLDL Cholesterol Cal: 39 mg/dL (ref 5–40)

## 2015-08-02 LAB — T4, FREE: Free T4: 1.55 ng/dL (ref 0.82–1.77)

## 2015-08-04 ENCOUNTER — Other Ambulatory Visit: Payer: Self-pay

## 2015-08-04 DIAGNOSIS — E871 Hypo-osmolality and hyponatremia: Secondary | ICD-10-CM

## 2015-08-08 ENCOUNTER — Other Ambulatory Visit: Payer: Medicare Other

## 2015-08-08 ENCOUNTER — Telehealth: Payer: Self-pay

## 2015-08-08 ENCOUNTER — Ambulatory Visit
Admission: RE | Admit: 2015-08-08 | Discharge: 2015-08-08 | Disposition: A | Discharge status: Home / Self Care | Payer: Medicare Other | Source: Ambulatory Visit | Attending: Internal Medicine | Admitting: Internal Medicine

## 2015-08-08 DIAGNOSIS — M545 Low back pain, unspecified: Secondary | ICD-10-CM

## 2015-08-08 DIAGNOSIS — M5431 Sciatica, right side: Secondary | ICD-10-CM

## 2015-08-08 DIAGNOSIS — S32030A Wedge compression fracture of third lumbar vertebra, initial encounter for closed fracture: Secondary | ICD-10-CM | POA: Diagnosis not present

## 2015-08-08 DIAGNOSIS — G8929 Other chronic pain: Secondary | ICD-10-CM

## 2015-08-08 DIAGNOSIS — E871 Hypo-osmolality and hyponatremia: Secondary | ICD-10-CM | POA: Diagnosis not present

## 2015-08-08 NOTE — Telephone Encounter (Signed)
Discussed results with patient, patient verbalized understanding of results (see below)  New compression fx at L3 - refer to Ortho for further tx  Referral order placed

## 2015-08-09 LAB — BASIC METABOLIC PANEL
BUN/Creatinine Ratio: 20 (ref 12–28)
BUN: 12 mg/dL (ref 8–27)
CO2: 27 mmol/L (ref 18–29)
Calcium: 8.7 mg/dL (ref 8.7–10.3)
Chloride: 87 mmol/L — ABNORMAL LOW (ref 96–106)
Creatinine, Ser: 0.61 mg/dL (ref 0.57–1.00)
GFR calc Af Amer: 100 mL/min/{1.73_m2} (ref >59)
GFR calc non Af Amer: 87 mL/min/{1.73_m2} (ref >59)
Glucose: 76 mg/dL (ref 65–99)
Potassium: 3.9 mmol/L (ref 3.5–5.2)
Sodium: 130 mmol/L — ABNORMAL LOW (ref 134–144)

## 2015-09-01 ENCOUNTER — Other Ambulatory Visit: Payer: Self-pay | Admitting: *Deleted

## 2015-09-01 DIAGNOSIS — M545 Low back pain, unspecified: Secondary | ICD-10-CM

## 2015-09-01 DIAGNOSIS — G8929 Other chronic pain: Secondary | ICD-10-CM

## 2015-09-01 MED ORDER — HYDROCODONE-ACETAMINOPHEN 10-325 MG PO TABS: ORAL_TABLET | ORAL | Status: DC

## 2015-09-01 NOTE — Telephone Encounter (Signed)
Abigail Edwards, son will pick up

## 2015-09-04 ENCOUNTER — Telehealth: Payer: Self-pay

## 2015-09-04 NOTE — Telephone Encounter (Signed)
Patient called c/o constant leg pain. Patient states she use to take gabapentin throughout the day and thinks that was better vs taking 300 mg 1 by mouth at bedtime. Patient states the only time she gets relief from leg pain is when she lays down.  Please advise if Gabapentin RX to be changed.   No available appointments this week. Last OV 08/01/15, pending OV 02/04/16

## 2015-09-05 MED ORDER — GABAPENTIN 300 MG PO CAPS: 300.0000 mg | ORAL_CAPSULE | Freq: Two times a day (BID) | ORAL | Status: DC

## 2015-09-05 NOTE — Telephone Encounter (Signed)
Ok to increase gabapentin 300mg  #60 take 1 po BID with 5 RF

## 2015-09-05 NOTE — Addendum Note (Signed)
Addended by: Logan Bores on: 09/05/2015 10:04 AM   Modules accepted: Orders, Medications

## 2015-09-05 NOTE — Telephone Encounter (Signed)
RX sent, left message informing patient of Dr.Carter's response

## 2015-10-01 ENCOUNTER — Other Ambulatory Visit: Payer: Self-pay | Admitting: *Deleted

## 2015-10-01 DIAGNOSIS — M545 Low back pain, unspecified: Secondary | ICD-10-CM

## 2015-10-01 DIAGNOSIS — G8929 Other chronic pain: Secondary | ICD-10-CM

## 2015-10-01 MED ORDER — HYDROCODONE-ACETAMINOPHEN 10-325 MG PO TABS: ORAL_TABLET | ORAL | 0 refills | Status: DC

## 2015-10-01 NOTE — Telephone Encounter (Signed)
Patient requested and son, Michael will pick up 

## 2015-10-22 ENCOUNTER — Other Ambulatory Visit: Payer: Self-pay | Admitting: Internal Medicine

## 2015-10-22 DIAGNOSIS — K219 Gastro-esophageal reflux disease without esophagitis: Secondary | ICD-10-CM

## 2015-10-28 ENCOUNTER — Other Ambulatory Visit: Payer: Self-pay | Admitting: Internal Medicine

## 2015-10-28 NOTE — Telephone Encounter (Signed)
Refill denied, rx already done on 10/23/15

## 2015-10-31 ENCOUNTER — Other Ambulatory Visit: Payer: Self-pay | Admitting: *Deleted

## 2015-10-31 DIAGNOSIS — M545 Low back pain, unspecified: Secondary | ICD-10-CM

## 2015-10-31 DIAGNOSIS — G8929 Other chronic pain: Secondary | ICD-10-CM

## 2015-10-31 MED ORDER — HYDROCODONE-ACETAMINOPHEN 10-325 MG PO TABS: ORAL_TABLET | ORAL | 0 refills | Status: DC

## 2015-10-31 NOTE — Telephone Encounter (Signed)
Patient requested and son, Michael will pick up 

## 2015-11-05 DIAGNOSIS — L84 Corns and callosities: Secondary | ICD-10-CM | POA: Diagnosis not present

## 2015-11-05 DIAGNOSIS — B351 Tinea unguium: Secondary | ICD-10-CM | POA: Diagnosis not present

## 2015-11-05 DIAGNOSIS — E1142 Type 2 diabetes mellitus with diabetic polyneuropathy: Secondary | ICD-10-CM | POA: Diagnosis not present

## 2015-11-19 DIAGNOSIS — H353221 Exudative age-related macular degeneration, left eye, with active choroidal neovascularization: Secondary | ICD-10-CM | POA: Diagnosis not present

## 2015-11-19 DIAGNOSIS — Z961 Presence of intraocular lens: Secondary | ICD-10-CM | POA: Diagnosis not present

## 2015-11-22 ENCOUNTER — Other Ambulatory Visit: Payer: Self-pay | Admitting: Internal Medicine

## 2015-11-26 ENCOUNTER — Telehealth: Payer: Self-pay

## 2015-11-26 DIAGNOSIS — G8929 Other chronic pain: Secondary | ICD-10-CM

## 2015-11-26 DIAGNOSIS — M5440 Lumbago with sciatica, unspecified side: Principal | ICD-10-CM

## 2015-11-26 NOTE — Telephone Encounter (Signed)
[facility] for early RF

## 2015-11-26 NOTE — Telephone Encounter (Signed)
Patient will be in [facility] tomorrow and would like to know if her rx can be released 3 days early. Last filled 10/31/15.  Please advise

## 2015-11-26 NOTE — Telephone Encounter (Signed)
Patient aware rx will be available for pick-up tomorrow

## 2015-11-27 ENCOUNTER — Ambulatory Visit (INDEPENDENT_AMBULATORY_CARE_PROVIDER_SITE_OTHER): Payer: Medicare Other | Admitting: Specialist

## 2015-11-27 DIAGNOSIS — M810 Age-related osteoporosis without current pathological fracture: Secondary | ICD-10-CM

## 2015-11-27 DIAGNOSIS — M48062 Spinal stenosis, lumbar region with neurogenic claudication: Secondary | ICD-10-CM | POA: Diagnosis not present

## 2015-11-27 DIAGNOSIS — S32030D Wedge compression fracture of third lumbar vertebra, subsequent encounter for fracture with routine healing: Secondary | ICD-10-CM | POA: Diagnosis not present

## 2015-11-27 MED ORDER — HYDROCODONE-ACETAMINOPHEN 10-325 MG PO TABS: ORAL_TABLET | ORAL | 0 refills | Status: DC

## 2015-11-27 NOTE — Telephone Encounter (Signed)
Spoke with patient and advised rx ready for pick-up and it will be at the front desk.  

## 2015-11-27 NOTE — Telephone Encounter (Signed)
RX printed 

## 2015-11-28 ENCOUNTER — Other Ambulatory Visit (INDEPENDENT_AMBULATORY_CARE_PROVIDER_SITE_OTHER): Payer: Self-pay | Admitting: Specialist

## 2015-11-28 DIAGNOSIS — S22000A Wedge compression fracture of unspecified thoracic vertebra, initial encounter for closed fracture: Secondary | ICD-10-CM

## 2015-12-03 ENCOUNTER — Other Ambulatory Visit (INDEPENDENT_AMBULATORY_CARE_PROVIDER_SITE_OTHER): Payer: Self-pay | Admitting: Specialist

## 2015-12-03 DIAGNOSIS — M8580 Other specified disorders of bone density and structure, unspecified site: Secondary | ICD-10-CM

## 2015-12-17 ENCOUNTER — Other Ambulatory Visit: Payer: Self-pay

## 2015-12-17 MED ORDER — GABAPENTIN 300 MG PO CAPS: 300.0000 mg | ORAL_CAPSULE | Freq: Two times a day (BID) | ORAL | 2 refills | Status: DC

## 2015-12-19 ENCOUNTER — Other Ambulatory Visit: Payer: Self-pay

## 2015-12-19 MED ORDER — GABAPENTIN 300 MG PO CAPS: 300.0000 mg | ORAL_CAPSULE | Freq: Two times a day (BID) | ORAL | 1 refills | Status: DC

## 2015-12-25 ENCOUNTER — Telehealth: Payer: Self-pay

## 2015-12-25 NOTE — Telephone Encounter (Signed)
Message left on triage voicemail: Patient left message stating rx is due on Monday, yet she will be in [place] for another appointment tomorrow would like to know if rx can be printed on 12/26/15.  October had 31 days and rx is due on 12/27/15 (Saturday), patient will be able to get rx tomorrow since 12/27/15 falls on a weekend. Patient aware she is on the list for rx to be printed and someone will call her once signed and ready for pick-up.

## 2015-12-26 ENCOUNTER — Inpatient Hospital Stay: Admission: RE | Admit: 2015-12-26 | Payer: Medicare Other | Source: Ambulatory Visit

## 2015-12-26 ENCOUNTER — Other Ambulatory Visit: Payer: Self-pay | Admitting: *Deleted

## 2015-12-26 ENCOUNTER — Other Ambulatory Visit: Payer: Medicare Other

## 2015-12-26 ENCOUNTER — Ambulatory Visit
Admission: RE | Admit: 2015-12-26 | Discharge: 2015-12-26 | Disposition: A | Discharge status: Home / Self Care | Payer: Medicare Other | Source: Ambulatory Visit | Attending: Specialist | Admitting: Specialist

## 2015-12-26 DIAGNOSIS — M8580 Other specified disorders of bone density and structure, unspecified site: Secondary | ICD-10-CM

## 2015-12-26 DIAGNOSIS — M810 Age-related osteoporosis without current pathological fracture: Secondary | ICD-10-CM | POA: Diagnosis not present

## 2015-12-26 DIAGNOSIS — M5440 Lumbago with sciatica, unspecified side: Principal | ICD-10-CM

## 2015-12-26 DIAGNOSIS — Z780 Asymptomatic menopausal state: Secondary | ICD-10-CM | POA: Diagnosis not present

## 2015-12-26 DIAGNOSIS — G8929 Other chronic pain: Secondary | ICD-10-CM

## 2015-12-26 MED ORDER — HYDROCODONE-ACETAMINOPHEN 10-325 MG PO TABS: ORAL_TABLET | ORAL | 0 refills | Status: DC

## 2015-12-26 NOTE — Telephone Encounter (Signed)
Spoke with patient and advised rx ready for pick-up and it will be at the front desk.  

## 2015-12-26 NOTE — Telephone Encounter (Signed)
Patient requested and will pick up 

## 2016-01-23 ENCOUNTER — Other Ambulatory Visit: Payer: Self-pay | Admitting: *Deleted

## 2016-01-23 DIAGNOSIS — G8929 Other chronic pain: Secondary | ICD-10-CM

## 2016-01-23 DIAGNOSIS — M5440 Lumbago with sciatica, unspecified side: Principal | ICD-10-CM

## 2016-01-23 MED ORDER — HYDROCODONE-ACETAMINOPHEN 10-325 MG PO TABS: ORAL_TABLET | ORAL | 0 refills | Status: DC

## 2016-01-23 NOTE — Telephone Encounter (Signed)
Son, Legrand Como will pick up on Monday.

## 2016-02-04 ENCOUNTER — Ambulatory Visit (INDEPENDENT_AMBULATORY_CARE_PROVIDER_SITE_OTHER): Payer: Medicare Other

## 2016-02-04 ENCOUNTER — Encounter: Payer: Self-pay | Admitting: Internal Medicine

## 2016-02-04 ENCOUNTER — Ambulatory Visit (INDEPENDENT_AMBULATORY_CARE_PROVIDER_SITE_OTHER): Payer: Medicare Other | Admitting: Internal Medicine

## 2016-02-04 VITALS — BP 160/72 | HR 69 | Temp 97.7°F | Ht 62.0 in | Wt 165.0 lb

## 2016-02-04 DIAGNOSIS — Z1211 Encounter for screening for malignant neoplasm of colon: Secondary | ICD-10-CM | POA: Diagnosis not present

## 2016-02-04 DIAGNOSIS — E1140 Type 2 diabetes mellitus with diabetic neuropathy, unspecified: Secondary | ICD-10-CM

## 2016-02-04 DIAGNOSIS — M5440 Lumbago with sciatica, unspecified side: Secondary | ICD-10-CM

## 2016-02-04 DIAGNOSIS — I10 Essential (primary) hypertension: Secondary | ICD-10-CM | POA: Diagnosis not present

## 2016-02-04 DIAGNOSIS — Z0000 Encounter for general adult medical examination without abnormal findings: Secondary | ICD-10-CM | POA: Diagnosis not present

## 2016-02-04 DIAGNOSIS — E785 Hyperlipidemia, unspecified: Secondary | ICD-10-CM | POA: Diagnosis not present

## 2016-02-04 DIAGNOSIS — F1190 Opioid use, unspecified, uncomplicated: Secondary | ICD-10-CM

## 2016-02-04 DIAGNOSIS — G8929 Other chronic pain: Secondary | ICD-10-CM

## 2016-02-04 DIAGNOSIS — Z7189 Other specified counseling: Secondary | ICD-10-CM | POA: Diagnosis not present

## 2016-02-04 DIAGNOSIS — K219 Gastro-esophageal reflux disease without esophagitis: Secondary | ICD-10-CM | POA: Diagnosis not present

## 2016-02-04 DIAGNOSIS — E559 Vitamin D deficiency, unspecified: Secondary | ICD-10-CM | POA: Diagnosis not present

## 2016-02-04 DIAGNOSIS — M8000XD Age-related osteoporosis with current pathological fracture, unspecified site, subsequent encounter for fracture with routine healing: Secondary | ICD-10-CM | POA: Diagnosis not present

## 2016-02-04 DIAGNOSIS — S32030A Wedge compression fracture of third lumbar vertebra, initial encounter for closed fracture: Secondary | ICD-10-CM

## 2016-02-04 LAB — CBC WITH DIFFERENTIAL/PLATELET
Basophils Absolute: 103 cells/uL (ref 0–200)
Basophils Relative: 1 %
Eosinophils Absolute: 515 cells/uL — ABNORMAL HIGH (ref 15–500)
Eosinophils Relative: 5 %
HCT: 36.8 % (ref 35.0–45.0)
Hemoglobin: 11.1 g/dL — ABNORMAL LOW (ref 11.7–15.5)
Lymphocytes Relative: 30 %
Lymphs Abs: 3090 cells/uL (ref 850–3900)
MCH: 21.3 pg — ABNORMAL LOW (ref 27.0–33.0)
MCHC: 30.2 g/dL — ABNORMAL LOW (ref 32.0–36.0)
MCV: 70.5 fL — ABNORMAL LOW (ref 80.0–100.0)
MPV: 9.8 fL (ref 7.5–12.5)
Monocytes Absolute: 1339 cells/uL — ABNORMAL HIGH (ref 200–950)
Monocytes Relative: 13 %
Neutro Abs: 5253 cells/uL (ref 1500–7800)
Neutrophils Relative %: 51 %
Platelets: 359 10*3/uL (ref 140–400)
RBC: 5.22 MIL/uL — ABNORMAL HIGH (ref 3.80–5.10)
RDW: 17.7 % — ABNORMAL HIGH (ref 11.0–15.0)
WBC: 10.3 10*3/uL (ref 3.8–10.8)

## 2016-02-04 LAB — LIPID PANEL
Cholesterol: 219 mg/dL — ABNORMAL HIGH (ref <200)
HDL: 50 mg/dL — ABNORMAL LOW (ref >50)
LDL Cholesterol: 136 mg/dL — ABNORMAL HIGH (ref <100)
Total CHOL/HDL Ratio: 4.4 Ratio (ref <5.0)
Triglycerides: 163 mg/dL — ABNORMAL HIGH (ref <150)
VLDL: 33 mg/dL — ABNORMAL HIGH (ref <30)

## 2016-02-04 LAB — COMPLETE METABOLIC PANEL WITH GFR
ALT: 8 U/L (ref 6–29)
AST: 16 U/L (ref 10–35)
Albumin: 4.1 g/dL (ref 3.6–5.1)
Alkaline Phosphatase: 71 U/L (ref 33–130)
BUN: 9 mg/dL (ref 7–25)
CO2: 33 mmol/L — ABNORMAL HIGH (ref 20–31)
Calcium: 9.1 mg/dL (ref 8.6–10.4)
Chloride: 91 mmol/L — ABNORMAL LOW (ref 98–110)
Creat: 0.66 mg/dL (ref 0.60–0.93)
GFR, Est African American: >89 mL/min (ref >=60)
GFR, Est Non African American: 84 mL/min (ref >=60)
Glucose, Bld: 90 mg/dL (ref 65–99)
Potassium: 3.8 mmol/L (ref 3.5–5.3)
Sodium: 132 mmol/L — ABNORMAL LOW (ref 135–146)
Total Bilirubin: 0.4 mg/dL (ref 0.2–1.2)
Total Protein: 7 g/dL (ref 6.1–8.1)

## 2016-02-04 LAB — TSH: TSH: 2.78 mIU/L

## 2016-02-04 NOTE — Patient Instructions (Addendum)
Ms. Mockbee , Thank you for taking time to come for your Medicare Wellness Visit. I appreciate your ongoing commitment to your health goals. Please review the following plan we discussed and let me know if I can assist you in the future.   These are the goals we discussed: Goals    . <enter goal here>          Starting 02/04/16, I would like to maintain my current lifestyle.        This is a list of the screening recommended for you and due dates:  Health Maintenance  Topic Date Due  . Complete foot exam   10/11/2015  . Urine Protein Check  10/11/2015  . Hemoglobin A1C  01/31/2016  . Eye exam for diabetics  03/24/2016*  . Flu Shot  02/22/2019*  . Shingles Vaccine  02/22/2020*  . Tetanus Vaccine  02/22/2021  . DEXA scan (bone density measurement)  Completed  . Pneumonia vaccines  Completed  *Topic was postponed. The date shown is not the original due date.   Preventive Care for Adults  A healthy lifestyle and preventive care can promote health and wellness. Preventive health guidelines for adults include the following key practices.  . A routine yearly physical is a good way to check with your health care provider about your health and preventive screening. It is a chance to share any concerns and updates on your health and to receive a thorough exam.  . Visit your dentist for a routine exam and preventive care every 6 months. Brush your teeth twice a day and floss once a day. Good oral hygiene prevents tooth decay and gum disease.  . The frequency of eye exams is based on your age, health, family medical history, use  of contact lenses, and other factors. Follow your health care provider's ecommendations for frequency of eye exams.  . Eat a healthy diet. Foods like vegetables, fruits, whole grains, low-fat dairy products, and lean protein foods contain the nutrients you need without too many calories. Decrease your intake of foods high in solid fats, added sugars, and salt. Eat  the right amount of calories for you. Get information about a proper diet from your health care provider, if necessary.  . Regular physical exercise is one of the most important things you can do for your health. Most adults should get at least 150 minutes of moderate-intensity exercise (any activity that increases your heart rate and causes you to sweat) each week. In addition, most adults need muscle-strengthening exercises on 2 or more days a week.  Silver Sneakers may be a benefit available to you. To determine eligibility, you may visit the website: www.silversneakers.com or contact program at [phone] Mon-Fri between 8AM-8PM.   . Maintain a healthy weight. The body mass index (BMI) is a screening tool to identify possible weight problems. It provides an estimate of body fat based on height and weight. Your health care provider can find your BMI and can help you achieve or maintain a healthy weight.   For adults 20 years and older: ? A BMI below 18.5 is considered underweight. ? A BMI of 18.5 to 24.9 is normal. ? A BMI of 25 to 29.9 is considered overweight. ? A BMI of 30 and above is considered obese.   . Maintain normal blood lipids and cholesterol levels by exercising and minimizing your intake of saturated fat. Eat a balanced diet with plenty of fruit and vegetables. Blood tests for lipids and cholesterol should begin  at age 73 and be repeated every 5 years. If your lipid or cholesterol levels are high, you are over 50, or you are at high risk for heart disease, you may need your cholesterol levels checked more frequently. Ongoing high lipid and cholesterol levels should be treated with medicines if diet and exercise are not working.  . If you smoke, find out from your health care provider how to quit. If you do not use tobacco, please do not start.  . If you choose to drink alcohol, please do not consume more than 2 drinks per day. One drink is considered to be 12 ounces (355 mL)  of beer, 5 ounces (148 mL) of wine, or 1.5 ounces (44 mL) of liquor.  . If you are 80-70 years old, ask your health care provider if you should take aspirin to prevent strokes.  . Use sunscreen. Apply sunscreen liberally and repeatedly throughout the day. You should seek shade when your shadow is shorter than you. Protect yourself by wearing long sleeves, pants, a wide-brimmed hat, and sunglasses year round, whenever you are outdoors.  . Once a month, do a whole body skin exam, using a mirror to look at the skin on your back. Tell your health care provider of new moles, moles that have irregular borders, moles that are larger than a pencil eraser, or moles that have changed in shape or color.
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
## 2016-02-04 NOTE — Progress Notes (Signed)
Quick Notes   Health Maintenance:   Declined Flu shot; Due for foot exam, urine micro albumine; Eye appt scheduled next week.    Abnormal Screen:  None; MMSE-29/30 Passed Clock Test   Patient Concerns:  Pt concerned about her teeth; requests info on any Dental Assist. Will refer to C3.    Nurse Concerns:  BP elevated 160/72.

## 2016-02-04 NOTE — Patient Instructions (Addendum)
Encouraged her to exercise 30-45 minutes 4-5 times per week. Eat a well balanced diet. Avoid smoking. Limit alcohol intake. Wear seatbelt when riding in the car. Wear sun block (SPF >50) when spending extended times outside.  iFOB for colon cancer screening  Follow up with specialists as scheduled  Will call with lab results  Follow up with dentist  Follow up in 6 mos for routine visit
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
## 2016-02-05 LAB — HEMOGLOBIN A1C
Hgb A1c MFr Bld: 5.4 % (ref <5.7)
Mean Plasma Glucose: 108 mg/dL

## 2016-02-05 LAB — MICROALBUMIN / CREATININE URINE RATIO
Creatinine, Urine: 18 mg/dL — ABNORMAL LOW (ref 20–320)
Microalb, Ur: <0.2 mg/dL

## 2016-02-09 DIAGNOSIS — B351 Tinea unguium: Secondary | ICD-10-CM | POA: Diagnosis not present

## 2016-02-09 DIAGNOSIS — L84 Corns and callosities: Secondary | ICD-10-CM | POA: Diagnosis not present

## 2016-02-09 DIAGNOSIS — E1142 Type 2 diabetes mellitus with diabetic polyneuropathy: Secondary | ICD-10-CM | POA: Diagnosis not present

## 2016-02-11 DIAGNOSIS — E1165 Type 2 diabetes mellitus with hyperglycemia: Secondary | ICD-10-CM | POA: Diagnosis not present

## 2016-02-24 ENCOUNTER — Other Ambulatory Visit: Payer: Self-pay | Admitting: *Deleted

## 2016-02-24 ENCOUNTER — Telehealth: Payer: Self-pay

## 2016-02-24 DIAGNOSIS — G8929 Other chronic pain: Secondary | ICD-10-CM

## 2016-02-24 DIAGNOSIS — M5440 Lumbago with sciatica, unspecified side: Principal | ICD-10-CM

## 2016-02-24 MED ORDER — HYDROCODONE-ACETAMINOPHEN 10-325 MG PO TABS: ORAL_TABLET | ORAL | 0 refills | Status: DC

## 2016-02-24 NOTE — Telephone Encounter (Signed)
Patient requested and son, Abigail Edwards will pick up 

## 2016-02-24 NOTE — Telephone Encounter (Signed)
I spoke with patient to let her know that refill is ready to be picked up at the office. Prescription for hydrocodone/APAP 10-325 mg tablets , # 90.   Prescription was placed in filing cabinet at front desk.

## 2016-02-29 ENCOUNTER — Other Ambulatory Visit: Payer: Self-pay | Admitting: Internal Medicine

## 2016-03-26 ENCOUNTER — Other Ambulatory Visit: Payer: Self-pay | Admitting: *Deleted

## 2016-03-26 DIAGNOSIS — M5440 Lumbago with sciatica, unspecified side: Principal | ICD-10-CM

## 2016-03-26 DIAGNOSIS — G8929 Other chronic pain: Secondary | ICD-10-CM

## 2016-03-26 MED ORDER — HYDROCODONE-ACETAMINOPHEN 10-325 MG PO TABS: ORAL_TABLET | ORAL | 0 refills | Status: DC

## 2016-03-26 NOTE — Telephone Encounter (Signed)
Patient requested and Legrand Como will pick up

## 2016-04-01 ENCOUNTER — Other Ambulatory Visit: Payer: Self-pay | Admitting: Internal Medicine

## 2016-04-01 DIAGNOSIS — K219 Gastro-esophageal reflux disease without esophagitis: Secondary | ICD-10-CM

## 2016-04-15 ENCOUNTER — Other Ambulatory Visit: Payer: Self-pay | Admitting: Internal Medicine

## 2016-04-21 ENCOUNTER — Other Ambulatory Visit: Payer: Self-pay | Admitting: Internal Medicine

## 2016-04-23 ENCOUNTER — Other Ambulatory Visit: Payer: Self-pay | Admitting: *Deleted

## 2016-04-23 DIAGNOSIS — G8929 Other chronic pain: Secondary | ICD-10-CM

## 2016-04-23 DIAGNOSIS — M5440 Lumbago with sciatica, unspecified side: Principal | ICD-10-CM

## 2016-04-23 MED ORDER — HYDROCODONE-ACETAMINOPHEN 10-325 MG PO TABS: ORAL_TABLET | ORAL | 0 refills | Status: DC

## 2016-04-23 NOTE — Telephone Encounter (Signed)
Patient requested and son will pick up

## 2016-04-23 NOTE — Telephone Encounter (Signed)
Patient informed rx signed and ready for pickup  

## 2016-05-10 DIAGNOSIS — L84 Corns and callosities: Secondary | ICD-10-CM | POA: Diagnosis not present

## 2016-05-10 DIAGNOSIS — M79672 Pain in left foot: Secondary | ICD-10-CM | POA: Diagnosis not present

## 2016-05-10 DIAGNOSIS — B351 Tinea unguium: Secondary | ICD-10-CM | POA: Diagnosis not present

## 2016-05-10 DIAGNOSIS — E1142 Type 2 diabetes mellitus with diabetic polyneuropathy: Secondary | ICD-10-CM | POA: Diagnosis not present

## 2016-05-24 ENCOUNTER — Other Ambulatory Visit: Payer: Self-pay

## 2016-05-24 DIAGNOSIS — G8929 Other chronic pain: Secondary | ICD-10-CM

## 2016-05-24 DIAGNOSIS — M5440 Lumbago with sciatica, unspecified side: Principal | ICD-10-CM

## 2016-05-24 MED ORDER — HYDROCODONE-ACETAMINOPHEN 10-325 MG PO TABS: ORAL_TABLET | ORAL | 0 refills | Status: DC

## 2016-05-24 NOTE — Telephone Encounter (Signed)
Patient stated that her son Huey Romans would pick up tomorrow.

## 2016-06-04 ENCOUNTER — Ambulatory Visit (INDEPENDENT_AMBULATORY_CARE_PROVIDER_SITE_OTHER): Payer: Medicare Other | Admitting: Internal Medicine

## 2016-06-04 ENCOUNTER — Encounter: Payer: Self-pay | Admitting: Internal Medicine

## 2016-06-04 VITALS — BP 120/70 | HR 60 | Temp 98.0°F | Wt 165.0 lb

## 2016-06-04 DIAGNOSIS — K089 Disorder of teeth and supporting structures, unspecified: Secondary | ICD-10-CM

## 2016-06-04 DIAGNOSIS — E1140 Type 2 diabetes mellitus with diabetic neuropathy, unspecified: Secondary | ICD-10-CM

## 2016-06-04 DIAGNOSIS — K121 Other forms of stomatitis: Secondary | ICD-10-CM | POA: Diagnosis not present

## 2016-06-04 MED ORDER — DOXYCYCLINE HYCLATE 100 MG PO TABS: 100.0000 mg | ORAL_TABLET | Freq: Two times a day (BID) | ORAL | 0 refills | Status: DC
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
## 2016-06-04 NOTE — Patient Instructions (Signed)
Use a peroxide containing mouthwash over the counter.   Apply orajel to the sore in the back of your mouth and to numb the sore areas Take doxycycline 100mg  by mouth twice a day for 10 days.   I recommend eating yogurt each day while you take the antibiotic.   Drink plenty of water.

## 2016-06-09 DIAGNOSIS — H40013 Open angle with borderline findings, low risk, bilateral: Secondary | ICD-10-CM | POA: Diagnosis not present

## 2016-06-23 ENCOUNTER — Other Ambulatory Visit: Payer: Self-pay | Admitting: *Deleted

## 2016-06-23 DIAGNOSIS — G8929 Other chronic pain: Secondary | ICD-10-CM

## 2016-06-23 DIAGNOSIS — M5440 Lumbago with sciatica, unspecified side: Principal | ICD-10-CM

## 2016-06-23 MED ORDER — HYDROCODONE-ACETAMINOPHEN 10-325 MG PO TABS: ORAL_TABLET | ORAL | 0 refills | Status: DC

## 2016-06-23 NOTE — Telephone Encounter (Signed)
Patient requested, son will pick up

## 2016-06-27 ENCOUNTER — Other Ambulatory Visit: Payer: Self-pay | Admitting: Internal Medicine

## 2016-06-30 DIAGNOSIS — H353112 Nonexudative age-related macular degeneration, right eye, intermediate dry stage: Secondary | ICD-10-CM | POA: Diagnosis not present

## 2016-06-30 DIAGNOSIS — E1165 Type 2 diabetes mellitus with hyperglycemia: Secondary | ICD-10-CM | POA: Diagnosis not present

## 2016-06-30 DIAGNOSIS — H40013 Open angle with borderline findings, low risk, bilateral: Secondary | ICD-10-CM | POA: Diagnosis not present

## 2016-06-30 DIAGNOSIS — Z961 Presence of intraocular lens: Secondary | ICD-10-CM | POA: Diagnosis not present

## 2016-06-30 DIAGNOSIS — H353222 Exudative age-related macular degeneration, left eye, with inactive choroidal neovascularization: Secondary | ICD-10-CM | POA: Diagnosis not present

## 2016-06-30 LAB — HM DIABETES EYE EXAM

## 2016-07-14 ENCOUNTER — Other Ambulatory Visit: Payer: Self-pay | Admitting: Internal Medicine

## 2016-07-15 ENCOUNTER — Other Ambulatory Visit: Payer: Self-pay | Admitting: Internal Medicine

## 2016-07-20 ENCOUNTER — Other Ambulatory Visit: Payer: Self-pay | Admitting: Internal Medicine

## 2016-07-22 ENCOUNTER — Other Ambulatory Visit: Payer: Self-pay

## 2016-07-22 DIAGNOSIS — G8929 Other chronic pain: Secondary | ICD-10-CM

## 2016-07-22 DIAGNOSIS — M5440 Lumbago with sciatica, unspecified side: Principal | ICD-10-CM

## 2016-07-22 MED ORDER — HYDROCODONE-ACETAMINOPHEN 10-325 MG PO TABS: ORAL_TABLET | ORAL | 0 refills | Status: DC

## 2016-07-28 ENCOUNTER — Ambulatory Visit: Payer: Medicare Other | Admitting: Internal Medicine

## 2016-08-16 DIAGNOSIS — B351 Tinea unguium: Secondary | ICD-10-CM | POA: Diagnosis not present

## 2016-08-16 DIAGNOSIS — M79672 Pain in left foot: Secondary | ICD-10-CM | POA: Diagnosis not present

## 2016-08-16 DIAGNOSIS — E1142 Type 2 diabetes mellitus with diabetic polyneuropathy: Secondary | ICD-10-CM | POA: Diagnosis not present

## 2016-08-16 DIAGNOSIS — L84 Corns and callosities: Secondary | ICD-10-CM | POA: Diagnosis not present

## 2016-08-24 ENCOUNTER — Ambulatory Visit (INDEPENDENT_AMBULATORY_CARE_PROVIDER_SITE_OTHER): Payer: Medicare Other | Admitting: Internal Medicine

## 2016-08-24 ENCOUNTER — Encounter: Payer: Self-pay | Admitting: Internal Medicine

## 2016-08-24 VITALS — BP 124/68 | HR 62 | Temp 97.9°F | Ht 62.0 in | Wt 154.4 lb

## 2016-08-24 DIAGNOSIS — E1140 Type 2 diabetes mellitus with diabetic neuropathy, unspecified: Secondary | ICD-10-CM | POA: Diagnosis not present

## 2016-08-24 DIAGNOSIS — E1169 Type 2 diabetes mellitus with other specified complication: Secondary | ICD-10-CM | POA: Diagnosis not present

## 2016-08-24 DIAGNOSIS — E034 Atrophy of thyroid (acquired): Secondary | ICD-10-CM

## 2016-08-24 DIAGNOSIS — K089 Disorder of teeth and supporting structures, unspecified: Secondary | ICD-10-CM | POA: Diagnosis not present

## 2016-08-24 DIAGNOSIS — M5440 Lumbago with sciatica, unspecified side: Secondary | ICD-10-CM

## 2016-08-24 DIAGNOSIS — F329 Major depressive disorder, single episode, unspecified: Secondary | ICD-10-CM | POA: Diagnosis not present

## 2016-08-24 DIAGNOSIS — I10 Essential (primary) hypertension: Secondary | ICD-10-CM

## 2016-08-24 DIAGNOSIS — G8929 Other chronic pain: Secondary | ICD-10-CM

## 2016-08-24 DIAGNOSIS — E785 Hyperlipidemia, unspecified: Secondary | ICD-10-CM | POA: Diagnosis not present

## 2016-08-24 DIAGNOSIS — F1190 Opioid use, unspecified, uncomplicated: Secondary | ICD-10-CM | POA: Diagnosis not present

## 2016-08-24 DIAGNOSIS — M810 Age-related osteoporosis without current pathological fracture: Secondary | ICD-10-CM

## 2016-08-24 DIAGNOSIS — F32A Depression, unspecified: Secondary | ICD-10-CM

## 2016-08-24 LAB — TSH: TSH: 0.39 mIU/L — ABNORMAL LOW

## 2016-08-24 LAB — BASIC METABOLIC PANEL WITH GFR
BUN: 11 mg/dL (ref 7–25)
CO2: 30 mmol/L (ref 20–31)
Calcium: 8.9 mg/dL (ref 8.6–10.4)
Chloride: 92 mmol/L — ABNORMAL LOW (ref 98–110)
Creat: 0.64 mg/dL (ref 0.60–0.93)
GFR, Est African American: >89 mL/min (ref >=60)
GFR, Est Non African American: 85 mL/min (ref >=60)
Glucose, Bld: 75 mg/dL (ref 65–99)
Potassium: 4.1 mmol/L (ref 3.5–5.3)
Sodium: 134 mmol/L — ABNORMAL LOW (ref 135–146)

## 2016-08-24 LAB — LIPID PANEL
Cholesterol: 163 mg/dL (ref <200)
HDL: 52 mg/dL (ref >50)
LDL Cholesterol: 90 mg/dL (ref <100)
Total CHOL/HDL Ratio: 3.1 Ratio (ref <5.0)
Triglycerides: 104 mg/dL (ref <150)
VLDL: 21 mg/dL (ref <30)

## 2016-08-24 LAB — ALT: ALT: 6 U/L (ref 6–29)

## 2016-08-24 MED ORDER — HYDROCODONE-ACETAMINOPHEN 10-325 MG PO TABS: ORAL_TABLET | ORAL | 0 refills | Status: DC
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
## 2016-08-24 NOTE — Patient Instructions (Signed)
Continue current medications as ordered  Will call with lab results  Follow up with eye provider as scheduled  Follow up in 6 mos for AWV/EV

## 2016-08-25 LAB — HEMOGLOBIN A1C
Hgb A1c MFr Bld: 5.4 % (ref <5.7)
Mean Plasma Glucose: 108 mg/dL

## 2016-08-25 LAB — VITAMIN D 25 HYDROXY (VIT D DEFICIENCY, FRACTURES): Vit D, 25-Hydroxy: 38 ng/mL (ref 30–100)

## 2016-09-23 ENCOUNTER — Other Ambulatory Visit: Payer: Self-pay

## 2016-09-23 ENCOUNTER — Other Ambulatory Visit: Payer: Self-pay | Admitting: Internal Medicine

## 2016-09-23 DIAGNOSIS — M5440 Lumbago with sciatica, unspecified side: Principal | ICD-10-CM

## 2016-09-23 DIAGNOSIS — K219 Gastro-esophageal reflux disease without esophagitis: Secondary | ICD-10-CM

## 2016-09-23 DIAGNOSIS — G8929 Other chronic pain: Secondary | ICD-10-CM

## 2016-09-23 MED ORDER — HYDROCODONE-ACETAMINOPHEN 10-325 MG PO TABS: ORAL_TABLET | ORAL | 0 refills | Status: DC

## 2016-09-23 NOTE — Telephone Encounter (Signed)
Last refill:08/24/16

## 2016-10-14 ENCOUNTER — Other Ambulatory Visit: Payer: Self-pay | Admitting: Internal Medicine

## 2016-10-18 ENCOUNTER — Other Ambulatory Visit: Payer: Self-pay | Admitting: Internal Medicine

## 2016-10-22 ENCOUNTER — Other Ambulatory Visit: Payer: Self-pay | Admitting: *Deleted

## 2016-10-22 DIAGNOSIS — G8929 Other chronic pain: Secondary | ICD-10-CM

## 2016-10-22 DIAGNOSIS — M5440 Lumbago with sciatica, unspecified side: Principal | ICD-10-CM

## 2016-10-22 MED ORDER — HYDROCODONE-ACETAMINOPHEN 10-325 MG PO TABS: ORAL_TABLET | ORAL | 0 refills | Status: DC

## 2016-10-22 NOTE — Telephone Encounter (Signed)
Patient requested and son will pick up [employer].

## 2016-11-03 DIAGNOSIS — M79676 Pain in unspecified toe(s): Secondary | ICD-10-CM | POA: Diagnosis not present

## 2016-11-03 DIAGNOSIS — L84 Corns and callosities: Secondary | ICD-10-CM | POA: Diagnosis not present

## 2016-11-03 DIAGNOSIS — E1142 Type 2 diabetes mellitus with diabetic polyneuropathy: Secondary | ICD-10-CM | POA: Diagnosis not present

## 2016-11-03 DIAGNOSIS — B351 Tinea unguium: Secondary | ICD-10-CM | POA: Diagnosis not present

## 2016-11-09 ENCOUNTER — Other Ambulatory Visit: Payer: Self-pay | Admitting: Internal Medicine

## 2016-11-10 NOTE — Telephone Encounter (Signed)
Called the patient and confirmed that she is no longer using the prescription.

## 2016-11-22 ENCOUNTER — Other Ambulatory Visit: Payer: Self-pay | Admitting: *Deleted

## 2016-11-22 DIAGNOSIS — M5440 Lumbago with sciatica, unspecified side: Principal | ICD-10-CM

## 2016-11-22 DIAGNOSIS — G8929 Other chronic pain: Secondary | ICD-10-CM

## 2016-11-22 MED ORDER — HYDROCODONE-ACETAMINOPHEN 10-325 MG PO TABS: ORAL_TABLET | ORAL | 0 refills | Status: DC

## 2016-11-22 NOTE — Telephone Encounter (Signed)
Patient requested and will pick up  Abigail Edwards for [place] and [place] checked.

## 2016-12-22 ENCOUNTER — Other Ambulatory Visit: Payer: Self-pay

## 2016-12-22 DIAGNOSIS — M5440 Lumbago with sciatica, unspecified side: Principal | ICD-10-CM

## 2016-12-22 DIAGNOSIS — G8929 Other chronic pain: Secondary | ICD-10-CM

## 2016-12-22 MED ORDER — HYDROCODONE-ACETAMINOPHEN 10-325 MG PO TABS: ORAL_TABLET | ORAL | 0 refills | Status: DC

## 2016-12-22 NOTE — Telephone Encounter (Signed)
[place] Database verified and compliance confirmed   

## 2016-12-23 MED ORDER — HYDROCODONE-ACETAMINOPHEN 10-325 MG PO TABS: ORAL_TABLET | ORAL | 0 refills | Status: DC

## 2016-12-23 NOTE — Addendum Note (Signed)
Addended by: Logan Bores on: 12/23/2016 08:34 AM   Modules accepted: Orders

## 2016-12-29 DIAGNOSIS — H40013 Open angle with borderline findings, low risk, bilateral: Secondary | ICD-10-CM | POA: Diagnosis not present

## 2016-12-29 DIAGNOSIS — H353112 Nonexudative age-related macular degeneration, right eye, intermediate dry stage: Secondary | ICD-10-CM | POA: Diagnosis not present

## 2016-12-29 DIAGNOSIS — E1165 Type 2 diabetes mellitus with hyperglycemia: Secondary | ICD-10-CM | POA: Diagnosis not present

## 2016-12-29 DIAGNOSIS — H353222 Exudative age-related macular degeneration, left eye, with inactive choroidal neovascularization: Secondary | ICD-10-CM | POA: Diagnosis not present

## 2016-12-29 DIAGNOSIS — Z961 Presence of intraocular lens: Secondary | ICD-10-CM | POA: Diagnosis not present

## 2017-01-08 ENCOUNTER — Other Ambulatory Visit: Payer: Self-pay | Admitting: Internal Medicine

## 2017-01-10 DIAGNOSIS — B351 Tinea unguium: Secondary | ICD-10-CM | POA: Diagnosis not present

## 2017-01-10 DIAGNOSIS — M79676 Pain in unspecified toe(s): Secondary | ICD-10-CM | POA: Diagnosis not present

## 2017-01-10 DIAGNOSIS — E1142 Type 2 diabetes mellitus with diabetic polyneuropathy: Secondary | ICD-10-CM | POA: Diagnosis not present

## 2017-01-10 DIAGNOSIS — L84 Corns and callosities: Secondary | ICD-10-CM | POA: Diagnosis not present

## 2017-01-21 ENCOUNTER — Other Ambulatory Visit: Payer: Self-pay | Admitting: *Deleted

## 2017-01-21 DIAGNOSIS — G8929 Other chronic pain: Secondary | ICD-10-CM

## 2017-01-21 DIAGNOSIS — M5440 Lumbago with sciatica, unspecified side: Principal | ICD-10-CM

## 2017-01-21 MED ORDER — HYDROCODONE-ACETAMINOPHEN 10-325 MG PO TABS: ORAL_TABLET | ORAL | 0 refills | Status: DC

## 2017-01-21 NOTE — Telephone Encounter (Signed)
Patient requested and will pick up [place] Database Verified.  

## 2017-02-10 ENCOUNTER — Encounter: Payer: Self-pay | Admitting: Internal Medicine

## 2017-02-18 ENCOUNTER — Other Ambulatory Visit: Payer: Self-pay | Admitting: *Deleted

## 2017-02-18 DIAGNOSIS — M5440 Lumbago with sciatica, unspecified side: Principal | ICD-10-CM

## 2017-02-18 DIAGNOSIS — G8929 Other chronic pain: Secondary | ICD-10-CM

## 2017-02-18 MED ORDER — HYDROCODONE-ACETAMINOPHEN 10-325 MG PO TABS: ORAL_TABLET | ORAL | 0 refills | Status: DC

## 2017-02-18 NOTE — Telephone Encounter (Signed)
Patient requested [place] Database Verified Pharmacy Confirmed Pended Rx and sent to Dr. Carter for approval.  

## 2017-03-04 ENCOUNTER — Encounter: Payer: Self-pay | Admitting: Internal Medicine

## 2017-03-04 ENCOUNTER — Ambulatory Visit (INDEPENDENT_AMBULATORY_CARE_PROVIDER_SITE_OTHER): Payer: Medicare Other

## 2017-03-04 ENCOUNTER — Ambulatory Visit (INDEPENDENT_AMBULATORY_CARE_PROVIDER_SITE_OTHER): Payer: Medicare Other | Admitting: Internal Medicine

## 2017-03-04 VITALS — BP 134/60 | HR 65 | Temp 98.1°F | Ht 62.0 in | Wt 160.0 lb

## 2017-03-04 VITALS — BP 134/60 | HR 65 | Temp 98.1°F | Resp 16 | Ht 62.0 in | Wt 160.0 lb

## 2017-03-04 DIAGNOSIS — E785 Hyperlipidemia, unspecified: Secondary | ICD-10-CM | POA: Diagnosis not present

## 2017-03-04 DIAGNOSIS — F32A Depression, unspecified: Secondary | ICD-10-CM

## 2017-03-04 DIAGNOSIS — Z1211 Encounter for screening for malignant neoplasm of colon: Secondary | ICD-10-CM

## 2017-03-04 DIAGNOSIS — E1169 Type 2 diabetes mellitus with other specified complication: Secondary | ICD-10-CM | POA: Diagnosis not present

## 2017-03-04 DIAGNOSIS — Z0000 Encounter for general adult medical examination without abnormal findings: Secondary | ICD-10-CM | POA: Diagnosis not present

## 2017-03-04 DIAGNOSIS — D649 Anemia, unspecified: Secondary | ICD-10-CM | POA: Diagnosis not present

## 2017-03-04 DIAGNOSIS — F329 Major depressive disorder, single episode, unspecified: Secondary | ICD-10-CM | POA: Diagnosis not present

## 2017-03-04 DIAGNOSIS — E034 Atrophy of thyroid (acquired): Secondary | ICD-10-CM | POA: Diagnosis not present

## 2017-03-04 DIAGNOSIS — G8929 Other chronic pain: Secondary | ICD-10-CM

## 2017-03-04 DIAGNOSIS — M5441 Lumbago with sciatica, right side: Secondary | ICD-10-CM

## 2017-03-04 DIAGNOSIS — I10 Essential (primary) hypertension: Secondary | ICD-10-CM

## 2017-03-04 DIAGNOSIS — E1140 Type 2 diabetes mellitus with diabetic neuropathy, unspecified: Secondary | ICD-10-CM | POA: Diagnosis not present

## 2017-03-04 MED ORDER — ZOSTER VAC RECOMB ADJUVANTED 50 MCG/0.5ML IM SUSR: 0.5000 mL | Freq: Once | INTRAMUSCULAR | 1 refills | Status: DC

## 2017-03-04 NOTE — Patient Instructions (Addendum)
Continue current medications as ordered  Will call with lab results  Send home with stool card for colon cancer screening  Follow up in 6 mos for DM, HTN, hyperlipidemia, osteoporosis. Fasting labs prior to appt
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
## 2017-03-04 NOTE — Patient Instructions (Signed)
[name] , Thank you for taking time to come for your Medicare Wellness Visit. I appreciate your ongoing commitment to your health goals. Please review the following plan we discussed and let me know if I can assist you in the future.   Screening recommendations/referrals: Colonoscopy excluded, you are over age 81 Mammogram excluded, you are over age 58 Bone Density up to date Recommended yearly ophthalmology/optometry visit for glaucoma screening and checkup Recommended yearly dental visit for hygiene and checkup  Vaccinations: Influenza vaccine due, declined Pneumococcal vaccine up to date Tdap vaccine up to date. Due 02/22/2021 Shingles vaccine due, prescription sent to pharmacy    Advanced directives: Please bring [place] a copy of your living will and health care power of attorney once you fill it out  Conditions/risks identified: None  Next appointment: Dr. Eulas Post 03/04/2017 @ 12:30            Tyson Dense, RN 03/27/2018 @ 12:45pm   Preventive Care 65 Years and Older, Female Preventive care refers to lifestyle choices and visits with your health care provider that can promote health and wellness. What does preventive care include?  A yearly physical exam. This is also called an annual well check.  Dental exams once or twice a year.  Routine eye exams. Ask your health care provider how often you should have your eyes checked.  Personal lifestyle choices, including:  Daily care of your teeth and gums.  Regular physical activity.  Eating a healthy diet.  Avoiding tobacco and drug use.  Limiting alcohol use.  Practicing safe sex.  Taking low-dose aspirin every day.  Taking vitamin and mineral supplements as recommended by your health care provider. What happens during an annual well check? The services and screenings done by your health care provider during your annual well check will depend on your age, overall health, lifestyle risk factors, and family history of  disease. Counseling  Your health care provider may ask you questions about your:  Alcohol use.  Tobacco use.  Drug use.  Emotional well-being.  Home and relationship well-being.  Sexual activity.  Eating habits.  History of falls.  Memory and ability to understand (cognition).  Work and work Statistician.  Reproductive health. Screening  You may have the following tests or measurements:  Height, weight, and BMI.  Blood pressure.  Lipid and cholesterol levels. These may be checked every 5 years, or more frequently if you are over 40 years old.  Skin check.  Lung cancer screening. You may have this screening every year starting at age 49 if you have a 30-pack-year history of smoking and currently smoke or have quit within the past 15 years.  Fecal occult blood test (FOBT) of the stool. You may have this test every year starting at age 20.  Flexible sigmoidoscopy or colonoscopy. You may have a sigmoidoscopy every 5 years or a colonoscopy every 10 years starting at age 46.  Hepatitis C blood test.  Hepatitis B blood test.  Sexually transmitted disease (STD) testing.  Diabetes screening. This is done by checking your blood sugar (glucose) after you have not eaten for a while (fasting). You may have this done every 1-3 years.  Bone density scan. This is done to screen for osteoporosis. You may have this done starting at age 54.  Mammogram. This may be done every 1-2 years. Talk to your health care provider about how often you should have regular mammograms. Talk with your health care provider about your test results, treatment options,  and if necessary, the need for more tests. Vaccines  Your health care provider may recommend certain vaccines, such as:  Influenza vaccine. This is recommended every year.  Tetanus, diphtheria, and acellular pertussis (Tdap, Td) vaccine. You may need a Td booster every 10 years.  Zoster vaccine. You may need this after age  45.  Pneumococcal 13-valent conjugate (PCV13) vaccine. One dose is recommended after age 46.  Pneumococcal polysaccharide (PPSV23) vaccine. One dose is recommended after age 30. Talk to your health care provider about which screenings and vaccines you need and how often you need them. This information is not intended to replace advice given to you by your health care provider. Make sure you discuss any questions you have with your health care provider. Document Released: 03/07/2015 Document Revised: 10/29/2015 Document Reviewed: 12/10/2014 Elsevier Interactive Patient Education  2017 [place] Prevention in the Home Falls can cause injuries. They can happen to people of all ages. There are many things you can do to make your home safe and to help prevent falls. What can I do on the outside of my home?  Regularly fix the edges of walkways and driveways and fix any cracks.  Remove anything that might make you trip as you walk through a door, such as a raised step or threshold.  Trim any bushes or trees on the path to your home.  Use bright outdoor lighting.  Clear any walking paths of anything that might make someone trip, such as rocks or tools.  Regularly check to see if handrails are loose or broken. Make sure that both sides of any steps have handrails.  Any raised decks and porches should have guardrails on the edges.  Have any leaves, snow, or ice cleared regularly.  Use sand or salt on walking paths during winter.  Clean up any spills in your garage right away. This includes oil or grease spills. What can I do in the bathroom?  Use night lights.  Install grab bars by the toilet and in the tub and shower. Do not use towel bars as grab bars.  Use non-skid mats or decals in the tub or shower.  If you need to sit down in the shower, use a plastic, non-slip stool.  Keep the floor dry. Clean up any water that spills on the floor as soon as it happens.  Remove  soap buildup in the tub or shower regularly.  Attach bath mats securely with double-sided non-slip rug tape.  Do not have throw rugs and other things on the floor that can make you trip. What can I do in the bedroom?  Use night lights.  Make sure that you have a light by your bed that is easy to reach.  Do not use any sheets or blankets that are too big for your bed. They should not hang down onto the floor.  Have a firm chair that has side arms. You can use this for support while you get dressed.  Do not have throw rugs and other things on the floor that can make you trip. What can I do in the kitchen?  Clean up any spills right away.  Avoid walking on wet floors.  Keep items that you use a lot in easy-to-reach places.  If you need to reach something above you, use a strong step stool that has a grab bar.  Keep electrical cords out of the way.  Do not use floor polish or wax that makes floors slippery. If  you must use wax, use non-skid floor wax.  Do not have throw rugs and other things on the floor that can make you trip. What can I do with my stairs?  Do not leave any items on the stairs.  Make sure that there are handrails on both sides of the stairs and use them. Fix handrails that are broken or loose. Make sure that handrails are as long as the stairways.  Check any carpeting to make sure that it is firmly attached to the stairs. Fix any carpet that is loose or worn.  Avoid having throw rugs at the top or bottom of the stairs. If you do have throw rugs, attach them to the floor with carpet tape.  Make sure that you have a light switch at the top of the stairs and the bottom of the stairs. If you do not have them, ask someone to add them for you. What else can I do to help prevent falls?  Wear shoes that:  Do not have high heels.  Have rubber bottoms.  Are comfortable and fit you well.  Are closed at the toe. Do not wear sandals.  If you use a  stepladder:  Make sure that it is fully opened. Do not climb a closed stepladder.  Make sure that both sides of the stepladder are locked into place.  Ask someone to hold it for you, if possible.  Clearly mark and make sure that you can see:  Any grab bars or handrails.  First and last steps.  Where the edge of each step is.  Use tools that help you move around (mobility aids) if they are needed. These include:  Canes.  Walkers.  Scooters.  Crutches.  Turn on the lights when you go into a dark area. Replace any light bulbs as soon as they burn out.  Set up your furniture so you have a clear path. Avoid moving your furniture around.  If any of your floors are uneven, fix them.  If there are any pets around you, be aware of where they are.  Review your medicines with your doctor. Some medicines can make you feel dizzy. This can increase your chance of falling. Ask your doctor what other things that you can do to help prevent falls. This information is not intended to replace advice given to you by your health care provider. Make sure you discuss any questions you have with your health care provider. Document Released: 12/05/2008 Document Revised: 07/17/2015 Document Reviewed: 03/15/2014 Elsevier Interactive Patient Education  2017 [employer].

## 2017-03-08 ENCOUNTER — Encounter: Payer: Self-pay | Admitting: Internal Medicine

## 2017-03-08 LAB — URINALYSIS, ROUTINE W REFLEX MICROSCOPIC
Bacteria, UA: NONE SEEN /HPF
Bilirubin Urine: NEGATIVE
Glucose, UA: NEGATIVE
Hgb urine dipstick: NEGATIVE
Hyaline Cast: NONE SEEN /LPF
Ketones, ur: NEGATIVE
Nitrite: NEGATIVE
Protein, ur: NEGATIVE
RBC / HPF: NONE SEEN /HPF (ref 0–2)
Specific Gravity, Urine: 1.004 (ref 1.001–1.03)
WBC, UA: NONE SEEN /HPF (ref 0–5)
pH: 7 (ref 5.0–8.0)

## 2017-03-08 LAB — COMPLETE METABOLIC PANEL WITH GFR
AG Ratio: 1.5 (calc) (ref 1.0–2.5)
ALT: 9 U/L (ref 6–29)
AST: 15 U/L (ref 10–35)
Albumin: 4 g/dL (ref 3.6–5.1)
Alkaline phosphatase (APISO): 71 U/L (ref 33–130)
BUN: 12 mg/dL (ref 7–25)
CO2: 34 mmol/L — ABNORMAL HIGH (ref 20–32)
Calcium: 8.8 mg/dL (ref 8.6–10.4)
Chloride: 91 mmol/L — ABNORMAL LOW (ref 98–110)
Creat: 0.67 mg/dL (ref 0.60–0.88)
GFR, Est African American: 96 mL/min/{1.73_m2} (ref >=60)
GFR, Est Non African American: 83 mL/min/{1.73_m2} (ref >=60)
Globulin: 2.6 g/dL (calc) (ref 1.9–3.7)
Glucose, Bld: 80 mg/dL (ref 65–99)
Potassium: 3.6 mmol/L (ref 3.5–5.3)
Sodium: 132 mmol/L — ABNORMAL LOW (ref 135–146)
Total Bilirubin: 0.4 mg/dL (ref 0.2–1.2)
Total Protein: 6.6 g/dL (ref 6.1–8.1)

## 2017-03-08 LAB — CBC WITH DIFFERENTIAL/PLATELET
Basophils Absolute: 69 cells/uL (ref 0–200)
Basophils Relative: 0.7 %
Eosinophils Absolute: 634 cells/uL — ABNORMAL HIGH (ref 15–500)
Eosinophils Relative: 6.4 %
HCT: 32.1 % — ABNORMAL LOW (ref 35.0–45.0)
Hemoglobin: 9.6 g/dL — ABNORMAL LOW (ref 11.7–15.5)
Lymphs Abs: 3218 cells/uL (ref 850–3900)
MCH: 20 pg — ABNORMAL LOW (ref 27.0–33.0)
MCHC: 29.9 g/dL — ABNORMAL LOW (ref 32.0–36.0)
MCV: 66.9 fL — ABNORMAL LOW (ref 80.0–100.0)
MPV: 10.8 fL (ref 7.5–12.5)
Monocytes Relative: 12.4 %
Neutro Abs: 4752 cells/uL (ref 1500–7800)
Neutrophils Relative %: 48 %
Platelets: 369 10*3/uL (ref 140–400)
RBC: 4.8 10*6/uL (ref 3.80–5.10)
RDW: 17.4 % — ABNORMAL HIGH (ref 11.0–15.0)
Total Lymphocyte: 32.5 %
WBC mixed population: 1228 cells/uL — ABNORMAL HIGH (ref 200–950)
WBC: 9.9 10*3/uL (ref 3.8–10.8)

## 2017-03-08 LAB — HEMOGLOBIN A1C
Hgb A1c MFr Bld: 5.5 % of total Hgb (ref <5.7)
Mean Plasma Glucose: 111 (calc)
eAG (mmol/L): 6.2 (calc)

## 2017-03-08 LAB — IRON,TIBC AND FERRITIN PANEL
%SAT: 3 % (calc) — ABNORMAL LOW (ref 11–50)
Ferritin: 6 ng/mL — ABNORMAL LOW (ref 20–288)
Iron: 15 ug/dL — ABNORMAL LOW (ref 45–160)
TIBC: 429 mcg/dL (calc) (ref 250–450)

## 2017-03-08 LAB — TEST AUTHORIZATION

## 2017-03-08 LAB — LIPID PANEL
Cholesterol: 180 mg/dL (ref <200)
HDL: 52 mg/dL (ref >50)
LDL Cholesterol (Calc): 107 mg/dL (calc) — ABNORMAL HIGH
Non-HDL Cholesterol (Calc): 128 mg/dL (calc) (ref <130)
Total CHOL/HDL Ratio: 3.5 (calc) (ref <5.0)
Triglycerides: 118 mg/dL (ref <150)

## 2017-03-08 LAB — MICROALBUMIN / CREATININE URINE RATIO
Creatinine, Urine: 20 mg/dL (ref 20–275)
Microalb Creat Ratio: 10 mcg/mg creat (ref <30)
Microalb, Ur: 0.2 mg/dL

## 2017-03-08 LAB — T4, FREE: Free T4: 1.2 ng/dL (ref 0.8–1.8)

## 2017-03-08 LAB — TSH: TSH: 1.35 mIU/L (ref 0.40–4.50)

## 2017-03-09 ENCOUNTER — Ambulatory Visit: Payer: Medicare Other

## 2017-03-09 ENCOUNTER — Telehealth: Payer: Self-pay

## 2017-03-09 ENCOUNTER — Encounter: Payer: Self-pay | Admitting: Gastroenterology

## 2017-03-09 ENCOUNTER — Ambulatory Visit: Payer: Medicare Other | Admitting: Internal Medicine

## 2017-03-09 DIAGNOSIS — D509 Iron deficiency anemia, unspecified: Secondary | ICD-10-CM

## 2017-03-09 MED ORDER — FERROUS SULFATE 325 (65 FE) MG PO TBEC: 325.0000 mg | DELAYED_RELEASE_TABLET | Freq: Two times a day (BID) | ORAL | 6 refills | Status: DC

## 2017-03-09 NOTE — Telephone Encounter (Signed)
-----   Message from [place], [place] sent at 03/09/2017  1:56 PM EST ----- (+) iron deficiency anemia - highly recommend GI referral to further assess for potential GI source; start ferrous sulfate 325mg  #60 take 1 tab po BID with 6RF;

## 2017-03-09 NOTE — Telephone Encounter (Signed)
Discussed iron study results with patient, patient verbalized understanding. Patient in agreement with GI referral. Patient states she tried iron in the past and it caused severe constipation yet she is willing to try again due to low iron levels   Patient states she has bad teeth and does not each a lot of meat and possibly that is why her level is abnormal- Dr.Carter verbally informed and still recommends referral to GI

## 2017-03-10 ENCOUNTER — Telehealth: Payer: Self-pay

## 2017-03-10 NOTE — Telephone Encounter (Signed)
Patient aware of instructions and verbalized understanding.

## 2017-03-10 NOTE — Telephone Encounter (Signed)
She can get OTC iron tabs to take BID

## 2017-03-10 NOTE — Telephone Encounter (Signed)
Patient is unable to afford ferrous sulfate, medication will cost 25 dollars monthly and that is too muck.  Please advise on alternative

## 2017-03-15 ENCOUNTER — Ambulatory Visit: Payer: Medicare Other | Admitting: Internal Medicine

## 2017-03-21 ENCOUNTER — Other Ambulatory Visit: Payer: Self-pay | Admitting: Internal Medicine

## 2017-03-21 DIAGNOSIS — M5440 Lumbago with sciatica, unspecified side: Secondary | ICD-10-CM

## 2017-03-21 DIAGNOSIS — G8929 Other chronic pain: Secondary | ICD-10-CM

## 2017-03-21 DIAGNOSIS — K219 Gastro-esophageal reflux disease without esophagitis: Secondary | ICD-10-CM

## 2017-03-21 MED ORDER — HYDROCODONE-ACETAMINOPHEN 10-325 MG PO TABS: ORAL_TABLET | ORAL | 0 refills | Status: DC

## 2017-03-21 NOTE — Telephone Encounter (Signed)
Patient Requested [facility] Confirmed Pended Rx and sent to Dr. Eulas Post for approval.

## 2017-03-21 NOTE — Telephone Encounter (Signed)
Patient is calling regarding status of Rx. Patient would like to pick up Rx from pharmacy today. Resent to Provider.

## 2017-03-23 DIAGNOSIS — B351 Tinea unguium: Secondary | ICD-10-CM | POA: Diagnosis not present

## 2017-03-23 DIAGNOSIS — E1142 Type 2 diabetes mellitus with diabetic polyneuropathy: Secondary | ICD-10-CM | POA: Diagnosis not present

## 2017-03-23 DIAGNOSIS — M79676 Pain in unspecified toe(s): Secondary | ICD-10-CM | POA: Diagnosis not present

## 2017-03-23 DIAGNOSIS — L84 Corns and callosities: Secondary | ICD-10-CM | POA: Diagnosis not present

## 2017-04-02 ENCOUNTER — Other Ambulatory Visit: Payer: Self-pay | Admitting: Internal Medicine

## 2017-04-19 ENCOUNTER — Other Ambulatory Visit: Payer: Self-pay | Admitting: *Deleted

## 2017-04-19 ENCOUNTER — Ambulatory Visit: Payer: Medicare Other | Admitting: Gastroenterology

## 2017-04-19 DIAGNOSIS — G8929 Other chronic pain: Secondary | ICD-10-CM

## 2017-04-19 DIAGNOSIS — M5440 Lumbago with sciatica, unspecified side: Principal | ICD-10-CM

## 2017-04-19 MED ORDER — HYDROCODONE-ACETAMINOPHEN 10-325 MG PO TABS: ORAL_TABLET | ORAL | 0 refills | Status: DC

## 2017-04-19 NOTE — Telephone Encounter (Signed)
Patient requested refill [facility] Confirmed Pended Rx and sent to Dr. Carter for approval.  

## 2017-05-17 ENCOUNTER — Other Ambulatory Visit: Payer: Self-pay

## 2017-05-17 DIAGNOSIS — M5440 Lumbago with sciatica, unspecified side: Principal | ICD-10-CM

## 2017-05-17 DIAGNOSIS — G8929 Other chronic pain: Secondary | ICD-10-CM

## 2017-05-17 MED ORDER — HYDROCODONE-ACETAMINOPHEN 10-325 MG PO TABS: ORAL_TABLET | ORAL | 0 refills | Status: DC

## 2017-05-17 NOTE — Telephone Encounter (Signed)
I was unable to locate patient in the [facility]

## 2017-05-18 NOTE — Telephone Encounter (Signed)
Left detailed message informing patient rx sent to pharmacy

## 2017-05-19 IMAGING — CR DG LUMBAR SPINE COMPLETE 4+V
5 series · 5 of 5 positions shown · non-contrast
Comparison: 12/19/2010.

CLINICAL DATA: Pain radiating down right side.

EXAM:
LUMBAR SPINE - COMPLETE 4+ VIEW

[view not recorded (1 of 5)]
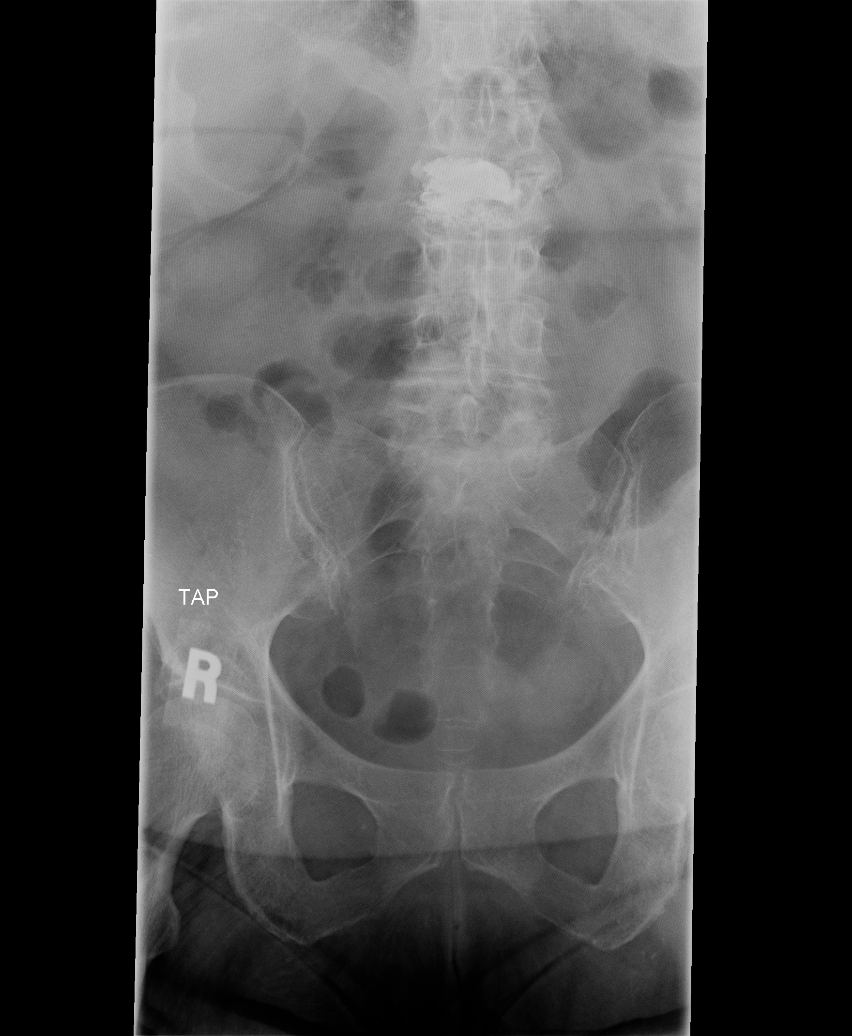

[view not recorded (2 of 5)]
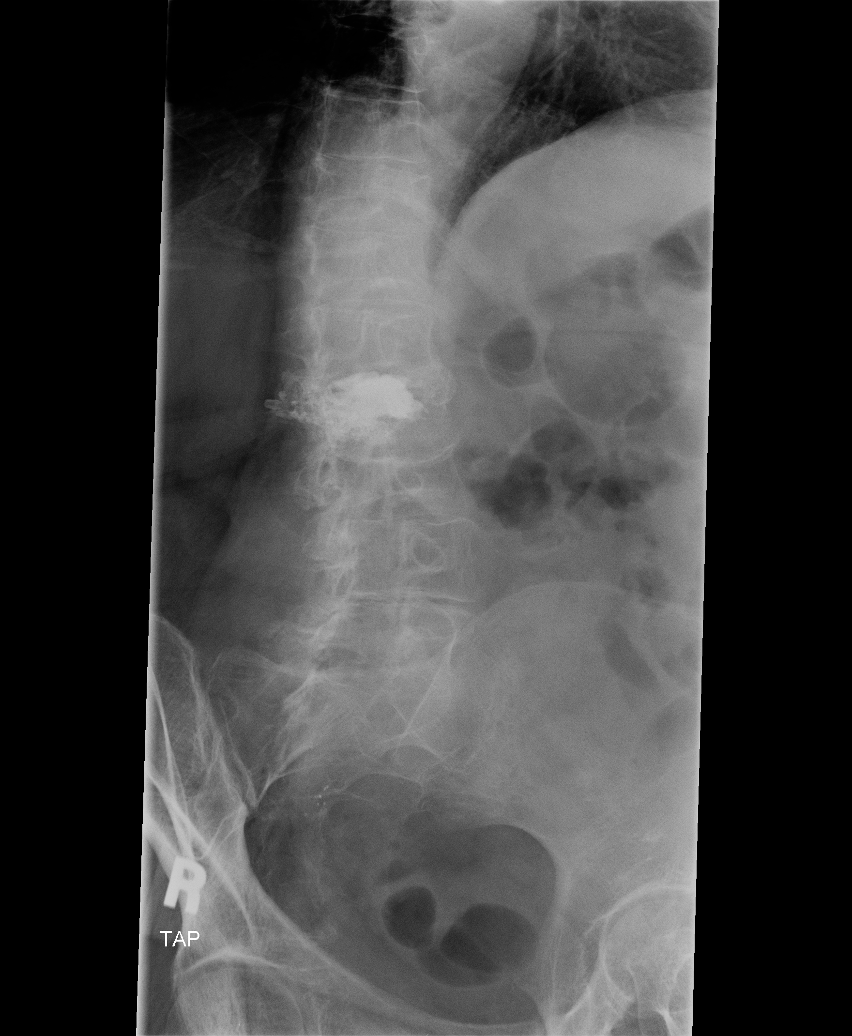

[view not recorded (3 of 5)]
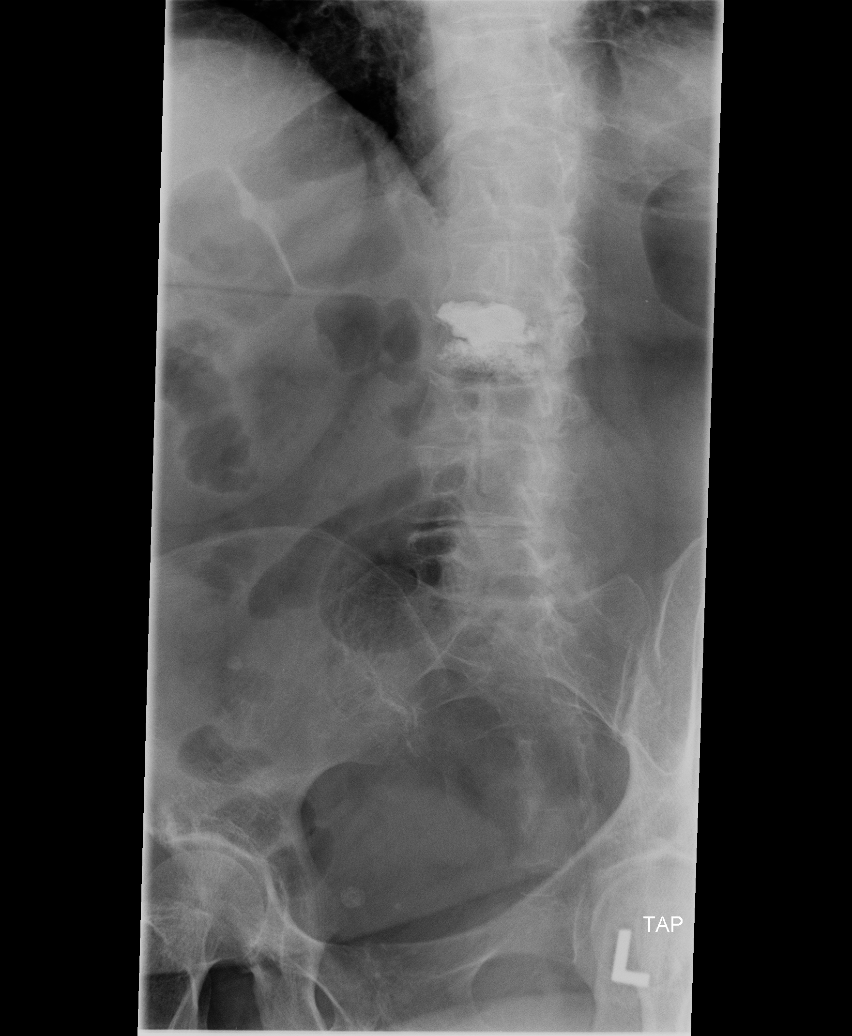

[view not recorded (4 of 5)]
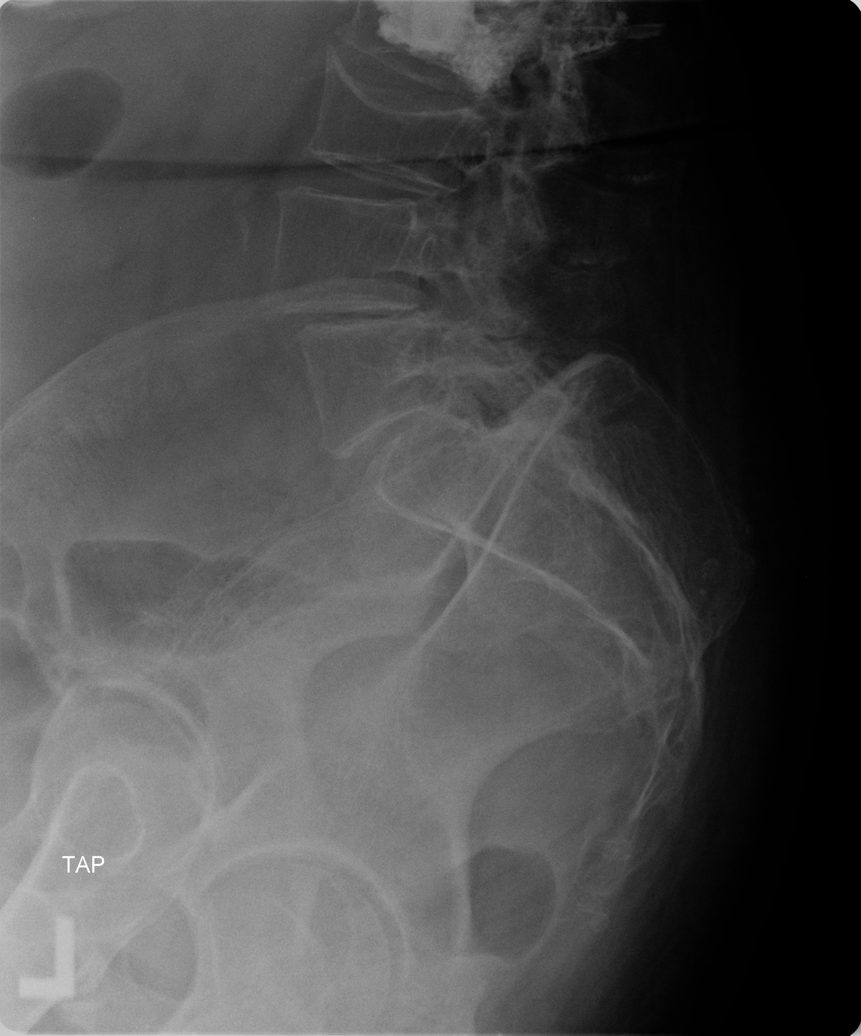

[view not recorded (5 of 5)]
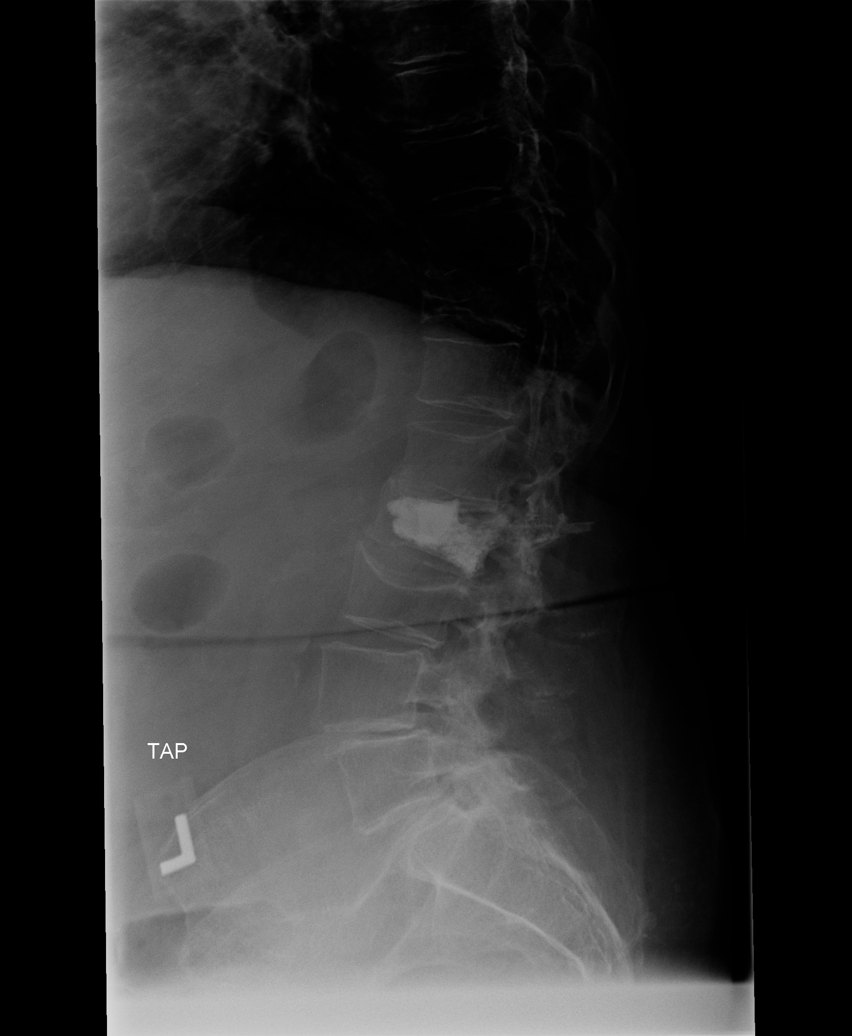

[5 of 5 positions shown; findings below may reference images not displayed]

FINDINGS: Diffuse multilevel degenerative change and osteopenia lumbar spine.
New onset L3 compression fracture. Prior L2 vertebroplasty.
Aortoiliac atherosclerotic vascular disease.
IMPRESSION: 1. New onset L3 compression fracture. Compression fractures
moderate.

2. Prior L2 vertebroplasty. Diffuse multilevel degenerative change.
5 mm anterolisthesis L4-L5 again noted. Diffuse osteopenia again
noted.

3. Aortoiliac atherosclerotic vascular disease .

## 2017-05-31 ENCOUNTER — Other Ambulatory Visit (INDEPENDENT_AMBULATORY_CARE_PROVIDER_SITE_OTHER): Payer: Medicare Other

## 2017-05-31 ENCOUNTER — Encounter: Payer: Self-pay | Admitting: Gastroenterology

## 2017-05-31 ENCOUNTER — Ambulatory Visit (INDEPENDENT_AMBULATORY_CARE_PROVIDER_SITE_OTHER): Payer: Medicare Other | Admitting: Gastroenterology

## 2017-05-31 VITALS — BP 136/62 | HR 70 | Ht 63.0 in | Wt 161.0 lb

## 2017-05-31 DIAGNOSIS — K5909 Other constipation: Secondary | ICD-10-CM

## 2017-05-31 DIAGNOSIS — D509 Iron deficiency anemia, unspecified: Secondary | ICD-10-CM | POA: Diagnosis not present

## 2017-05-31 LAB — IBC PANEL
Iron: 10 ug/dL — ABNORMAL LOW (ref 42–145)
Saturation Ratios: 2.3 % — ABNORMAL LOW (ref 20.0–50.0)
Transferrin: 315 mg/dL (ref 212.0–360.0)

## 2017-05-31 LAB — CBC WITH DIFFERENTIAL/PLATELET
Basophils Absolute: 0.1 10*3/uL (ref 0.0–0.1)
Basophils Relative: 0.7 % (ref 0.0–3.0)
Eosinophils Absolute: 0.4 10*3/uL (ref 0.0–0.7)
Eosinophils Relative: 3.9 % (ref 0.0–5.0)
HCT: 29.4 % — ABNORMAL LOW (ref 36.0–46.0)
Hemoglobin: 9.1 g/dL — ABNORMAL LOW (ref 12.0–15.0)
Lymphocytes Relative: 29.1 % (ref 12.0–46.0)
Lymphs Abs: 3 10*3/uL (ref 0.7–4.0)
MCHC: 30.9 g/dL (ref 30.0–36.0)
MCV: 64.6 fl — ABNORMAL LOW (ref 78.0–100.0)
Monocytes Absolute: 1 10*3/uL (ref 0.1–1.0)
Monocytes Relative: 10 % (ref 3.0–12.0)
Neutro Abs: 5.8 10*3/uL (ref 1.4–7.7)
Neutrophils Relative %: 56.3 % (ref 43.0–77.0)
Platelets: 318 10*3/uL (ref 150.0–400.0)
RBC: 4.58 Mil/uL (ref 3.87–5.11)
RDW: 18.5 % — ABNORMAL HIGH (ref 11.5–15.5)
WBC: 10.4 10*3/uL (ref 4.0–10.5)

## 2017-05-31 LAB — FERRITIN: Ferritin: 4.2 ng/mL — ABNORMAL LOW (ref 10.0–291.0)

## 2017-05-31 NOTE — Patient Instructions (Signed)
If you are age 81 or older, your body mass index should be between 23-30. Your Body mass index is 28.52 kg/m. If this is out of the aforementioned range listed, please consider follow up with your Primary Care Provider.  If you are age 13 or younger, your body mass index should be between 19-25. Your Body mass index is 28.52 kg/m. If this is out of the aformentioned range listed, please consider follow up with your Primary Care Provider.   You have been scheduled for an endoscopy and colonoscopy. Please follow the written instructions given to you at your visit today. Please pick up your prep supplies at the pharmacy within the next 1-3 days. If you use inhalers (even only as needed), please bring them with you on the day of your procedure. Your physician has requested that you go to www.startemmi.com and enter the access code given to you at your visit today. This web site gives a general overview about your procedure. However, you should still follow specific instructions given to you by our office regarding your preparation for the procedure.  Thank you for choosing [facility]  Dr Wilfrid Lund III
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
## 2017-06-06 ENCOUNTER — Other Ambulatory Visit: Payer: Self-pay

## 2017-06-06 DIAGNOSIS — D509 Iron deficiency anemia, unspecified: Secondary | ICD-10-CM

## 2017-06-13 ENCOUNTER — Other Ambulatory Visit: Payer: Self-pay | Admitting: Internal Medicine

## 2017-06-15 ENCOUNTER — Other Ambulatory Visit (HOSPITAL_COMMUNITY): Payer: Self-pay | Admitting: *Deleted

## 2017-06-15 ENCOUNTER — Other Ambulatory Visit: Payer: Self-pay | Admitting: *Deleted

## 2017-06-15 DIAGNOSIS — G8929 Other chronic pain: Secondary | ICD-10-CM

## 2017-06-15 DIAGNOSIS — M5440 Lumbago with sciatica, unspecified side: Principal | ICD-10-CM

## 2017-06-15 MED ORDER — HYDROCODONE-ACETAMINOPHEN 10-325 MG PO TABS: ORAL_TABLET | ORAL | 0 refills | Status: DC

## 2017-06-15 NOTE — Telephone Encounter (Signed)
Patient requested [facility] Verified LR: 05/18/17 Pharmacy Confirmed Pended Rx and sent to Dr. Eulas Post for approval.

## 2017-06-16 ENCOUNTER — Ambulatory Visit (HOSPITAL_COMMUNITY)
Admission: RE | Admit: 2017-06-16 | Discharge: 2017-06-16 | Disposition: A | Discharge status: Home / Self Care | Payer: Medicare Other | Source: Ambulatory Visit | Attending: Gastroenterology | Admitting: Gastroenterology

## 2017-06-16 DIAGNOSIS — D509 Iron deficiency anemia, unspecified: Secondary | ICD-10-CM | POA: Insufficient documentation

## 2017-06-16 MED ORDER — SODIUM CHLORIDE 0.9 % IV SOLN
510.0000 mg | Freq: Once | INTRAVENOUS | Status: AC
  Administered 2017-06-16: 510 mg via INTRAVENOUS
  Filled 2017-06-16: qty 17

## 2017-06-16 NOTE — Discharge Instructions (Signed)

## 2017-06-24 DIAGNOSIS — M79676 Pain in unspecified toe(s): Secondary | ICD-10-CM | POA: Diagnosis not present

## 2017-06-24 DIAGNOSIS — B351 Tinea unguium: Secondary | ICD-10-CM | POA: Diagnosis not present

## 2017-06-24 DIAGNOSIS — E1142 Type 2 diabetes mellitus with diabetic polyneuropathy: Secondary | ICD-10-CM | POA: Diagnosis not present

## 2017-06-24 DIAGNOSIS — L84 Corns and callosities: Secondary | ICD-10-CM | POA: Diagnosis not present

## 2017-06-28 ENCOUNTER — Encounter: Payer: Medicare Other | Admitting: Gastroenterology

## 2017-06-29 DIAGNOSIS — Z961 Presence of intraocular lens: Secondary | ICD-10-CM | POA: Diagnosis not present

## 2017-06-29 DIAGNOSIS — H02831 Dermatochalasis of right upper eyelid: Secondary | ICD-10-CM | POA: Diagnosis not present

## 2017-06-29 DIAGNOSIS — H02834 Dermatochalasis of left upper eyelid: Secondary | ICD-10-CM | POA: Diagnosis not present

## 2017-06-29 DIAGNOSIS — H353112 Nonexudative age-related macular degeneration, right eye, intermediate dry stage: Secondary | ICD-10-CM | POA: Diagnosis not present

## 2017-06-29 DIAGNOSIS — E1165 Type 2 diabetes mellitus with hyperglycemia: Secondary | ICD-10-CM | POA: Diagnosis not present

## 2017-06-29 DIAGNOSIS — H353222 Exudative age-related macular degeneration, left eye, with inactive choroidal neovascularization: Secondary | ICD-10-CM | POA: Diagnosis not present

## 2017-06-29 DIAGNOSIS — H40013 Open angle with borderline findings, low risk, bilateral: Secondary | ICD-10-CM | POA: Diagnosis not present

## 2017-06-29 LAB — HM DIABETES EYE EXAM

## 2017-07-01 ENCOUNTER — Encounter: Payer: Medicare Other | Admitting: Gastroenterology

## 2017-07-08 ENCOUNTER — Other Ambulatory Visit: Payer: Self-pay

## 2017-07-08 ENCOUNTER — Encounter (HOSPITAL_COMMUNITY): Payer: Self-pay | Admitting: *Deleted

## 2017-07-14 ENCOUNTER — Other Ambulatory Visit: Payer: Self-pay

## 2017-07-14 DIAGNOSIS — M5440 Lumbago with sciatica, unspecified side: Principal | ICD-10-CM

## 2017-07-14 DIAGNOSIS — G8929 Other chronic pain: Secondary | ICD-10-CM

## 2017-07-14 MED ORDER — HYDROCODONE-ACETAMINOPHEN 10-325 MG PO TABS: ORAL_TABLET | ORAL | 0 refills | Status: DC

## 2017-07-14 NOTE — Telephone Encounter (Signed)
Patient called to request a refill on hydrocodone 10-325 mg. Rx was pended to provider for approval  after verifying last fill date, provider, and quantity on PMP [place] database.  Last refill was on 06/15/17

## 2017-07-15 ENCOUNTER — Other Ambulatory Visit: Payer: Self-pay

## 2017-07-19 ENCOUNTER — Encounter (HOSPITAL_COMMUNITY): Payer: Self-pay | Admitting: *Deleted

## 2017-07-19 ENCOUNTER — Ambulatory Visit (HOSPITAL_COMMUNITY): Payer: Medicare Other | Admitting: Anesthesiology

## 2017-07-19 ENCOUNTER — Encounter (HOSPITAL_COMMUNITY)
Admission: RE | Disposition: A | Discharge status: Home / Self Care | Payer: Self-pay | Source: Ambulatory Visit | Attending: Gastroenterology

## 2017-07-19 ENCOUNTER — Other Ambulatory Visit: Payer: Self-pay

## 2017-07-19 ENCOUNTER — Ambulatory Visit (HOSPITAL_COMMUNITY)
Admission: RE | Admit: 2017-07-19 | Discharge: 2017-07-19 | Disposition: A | Discharge status: Home / Self Care | Payer: Medicare Other | Source: Ambulatory Visit | Attending: Gastroenterology | Admitting: Gastroenterology

## 2017-07-19 DIAGNOSIS — G4730 Sleep apnea, unspecified: Secondary | ICD-10-CM | POA: Diagnosis not present

## 2017-07-19 DIAGNOSIS — Z85828 Personal history of other malignant neoplasm of skin: Secondary | ICD-10-CM | POA: Insufficient documentation

## 2017-07-19 DIAGNOSIS — I252 Old myocardial infarction: Secondary | ICD-10-CM | POA: Diagnosis not present

## 2017-07-19 DIAGNOSIS — K6289 Other specified diseases of anus and rectum: Secondary | ICD-10-CM | POA: Diagnosis not present

## 2017-07-19 DIAGNOSIS — Z79899 Other long term (current) drug therapy: Secondary | ICD-10-CM | POA: Diagnosis not present

## 2017-07-19 DIAGNOSIS — K449 Diaphragmatic hernia without obstruction or gangrene: Secondary | ICD-10-CM | POA: Diagnosis not present

## 2017-07-19 DIAGNOSIS — K5710 Diverticulosis of small intestine without perforation or abscess without bleeding: Secondary | ICD-10-CM | POA: Diagnosis not present

## 2017-07-19 DIAGNOSIS — E1142 Type 2 diabetes mellitus with diabetic polyneuropathy: Secondary | ICD-10-CM | POA: Insufficient documentation

## 2017-07-19 DIAGNOSIS — Z791 Long term (current) use of non-steroidal anti-inflammatories (NSAID): Secondary | ICD-10-CM | POA: Diagnosis not present

## 2017-07-19 DIAGNOSIS — K5520 Angiodysplasia of colon without hemorrhage: Secondary | ICD-10-CM

## 2017-07-19 DIAGNOSIS — E039 Hypothyroidism, unspecified: Secondary | ICD-10-CM | POA: Diagnosis not present

## 2017-07-19 DIAGNOSIS — K3189 Other diseases of stomach and duodenum: Secondary | ICD-10-CM | POA: Diagnosis not present

## 2017-07-19 DIAGNOSIS — F329 Major depressive disorder, single episode, unspecified: Secondary | ICD-10-CM | POA: Diagnosis not present

## 2017-07-19 DIAGNOSIS — I10 Essential (primary) hypertension: Secondary | ICD-10-CM | POA: Diagnosis not present

## 2017-07-19 DIAGNOSIS — D509 Iron deficiency anemia, unspecified: Secondary | ICD-10-CM | POA: Diagnosis present

## 2017-07-19 DIAGNOSIS — K219 Gastro-esophageal reflux disease without esophagitis: Secondary | ICD-10-CM | POA: Insufficient documentation

## 2017-07-19 DIAGNOSIS — Z79890 Hormone replacement therapy (postmenopausal): Secondary | ICD-10-CM | POA: Diagnosis not present

## 2017-07-19 DIAGNOSIS — K5909 Other constipation: Secondary | ICD-10-CM

## 2017-07-19 DIAGNOSIS — K9289 Other specified diseases of the digestive system: Secondary | ICD-10-CM | POA: Diagnosis not present

## 2017-07-19 HISTORY — PX: COLONOSCOPY WITH PROPOFOL: SHX5780

## 2017-07-19 HISTORY — PX: ESOPHAGOGASTRODUODENOSCOPY (EGD) WITH PROPOFOL: SHX5813

## 2017-07-19 HISTORY — PX: BIOPSY: SHX5522

## 2017-07-19 HISTORY — DX: Unspecified macular degeneration: H35.30

## 2017-07-19 LAB — GLUCOSE, CAPILLARY: Glucose-Capillary: 79 mg/dL (ref 65–99)

## 2017-07-19 SURGERY — ESOPHAGOGASTRODUODENOSCOPY (EGD) WITH PROPOFOL
Anesthesia: Monitor Anesthesia Care

## 2017-07-19 MED ORDER — LIDOCAINE 2% (20 MG/ML) 5 ML SYRINGE
INTRAMUSCULAR | Status: DC | PRN
Start: 2017-07-19 — End: 2017-07-19
  Administered 2017-07-19: 100 mg via INTRAVENOUS

## 2017-07-19 MED ORDER — LACTATED RINGERS IV SOLN
INTRAVENOUS | Status: DC
  Administered 2017-07-19: 09:00:00 via INTRAVENOUS

## 2017-07-19 MED ORDER — PROPOFOL 500 MG/50ML IV EMUL
INTRAVENOUS | Status: DC | PRN
  Administered 2017-07-19: 120 ug/kg/min via INTRAVENOUS

## 2017-07-19 MED ORDER — PROPOFOL 10 MG/ML IV BOLUS
INTRAVENOUS | Status: AC
  Filled 2017-07-19: qty 40

## 2017-07-19 MED ORDER — PROPOFOL 10 MG/ML IV BOLUS
INTRAVENOUS | Status: DC | PRN
  Administered 2017-07-19 (×5): 20 mg via INTRAVENOUS

## 2017-07-19 MED ORDER — SODIUM CHLORIDE 0.9 % IV SOLN: INTRAVENOUS | Status: DC

## 2017-07-19 MED ORDER — ONDANSETRON HCL 4 MG/2ML IJ SOLN
INTRAMUSCULAR | Status: DC | PRN
  Administered 2017-07-19: 4 mg via INTRAVENOUS

## 2017-07-19 SURGICAL SUPPLY — 25 items

## 2017-07-19 NOTE — Discharge Instructions (Signed)
YOU HAD AN ENDOSCOPIC PROCEDURE TODAY: Refer to the procedure report and other information in the discharge instructions given to you for any specific questions about what was found during the examination. If this information does not answer your questions, please call [place] office at [phone] to clarify.  ° °YOU SHOULD EXPECT: Some feelings of bloating in the abdomen. Passage of more gas than usual. Walking can help get rid of the air that was put into your GI tract during the procedure and reduce the bloating. If you had a lower endoscopy (such as a colonoscopy or flexible sigmoidoscopy) you may notice spotting of blood in your stool or on the toilet paper. Some abdominal soreness may be present for a day or two, also. ° °DIET: Your first meal following the procedure should be a light meal and then it is ok to progress to your normal diet. A half-sandwich or bowl of soup is an example of a good first meal. Heavy or fried foods are harder to digest and may make you feel nauseous or bloated. Drink plenty of fluids but you should avoid alcoholic beverages for 24 hours. If you had a esophageal dilation, please see attached instructions for diet.   ° °ACTIVITY: Your care partner should take you home directly after the procedure. You should plan to take it easy, moving slowly for the rest of the day. You can resume normal activity the day after the procedure however YOU SHOULD NOT DRIVE, use power tools, machinery or perform tasks that involve climbing or major physical exertion for 24 hours (because of the sedation medicines used during the test).  ° °SYMPTOMS TO REPORT IMMEDIATELY: °A gastroenterologist can be reached at any hour. Please call [phone]  for any of the following symptoms:  °Following lower endoscopy (colonoscopy, flexible sigmoidoscopy) °Excessive amounts of blood in the stool  °Significant tenderness, worsening of abdominal pains  °Swelling of the abdomen that is new, acute  °Fever of 100° or  higher  °Following upper endoscopy (EGD, EUS, ERCP, esophageal dilation) °Vomiting of blood or coffee ground material  °New, significant abdominal pain  °New, significant chest pain or pain under the shoulder blades  °Painful or persistently difficult swallowing  °New shortness of breath  °Black, tarry-looking or red, bloody stools ° °FOLLOW UP:  °If any biopsies were taken you will be contacted by phone or by letter within the next 1-3 weeks. Call [phone]  if you have not heard about the biopsies in 3 weeks.  °Please also call with any specific questions about appointments or follow up tests. ° °

## 2017-07-19 NOTE — Anesthesia Procedure Notes (Signed)
Date/Time: 07/19/2017 9:32 AM Performed by: Sharlette Dense, CRNA Oxygen Delivery Method: Nasal cannula

## 2017-07-19 NOTE — Op Note (Signed)
[facility] Patient Name: Abigail [facility] Procedure Date: 07/19/2017 MRN: [id number] Attending MD: Estill Cotta. Loletha Carrow , MD Date of Birth: 03/29/1934 CSN: [id number] Age: 83 Admit Type: Outpatient Procedure:                Colonoscopy Indications:              Iron deficiency anemia (years) - no overt GI                            bleeding Providers:                Mallie Mussel L. Loletha Carrow, MD, Cleda Daub, RN, Charolette Child, Technician, Danley Danker, CRNA Referring MD:             Gildardo Cranker, DO Medicines:                Monitored Anesthesia Care Complications:            No immediate complications. Estimated Blood Loss:     Estimated blood loss: none. Procedure:                Pre-Anesthesia Assessment:                           - Prior to the procedure, a History and Physical                            was performed, and patient medications and                            allergies were reviewed. The patient's tolerance of                            previous anesthesia was also reviewed. The risks                            and benefits of the procedure and the sedation                            options and risks were discussed with the patient.                            All questions were answered, and informed consent                            was obtained. Prior Anticoagulants: The patient has                            taken no previous anticoagulant or antiplatelet                            agents. ASA Grade Assessment: III - A patient with  severe systemic disease. After reviewing the risks                            and benefits, the patient was deemed in                            satisfactory condition to undergo the procedure.                           After obtaining informed consent, the colonoscope                            was passed under direct vision. Throughout the                            procedure,  the patient's blood pressure, pulse, and                            oxygen saturations were monitored continuously. The                            EC-[id number] ([id number]) scope was introduced through                            the anus and advanced to the the cecum, identified                            by appendiceal orifice and ileocecal valve. The                            colonoscopy was performed without difficulty. The                            patient tolerated the procedure well. The quality                            of the bowel preparation was excellent. The                            ileocecal valve, appendiceal orifice, and rectum                            were photographed. The quality of the bowel                            preparation was evaluated using the BBPS [facility]                            Bowel Preparation Scale) with scores of: Right                            Colon = 3, Transverse Colon = 3 and Left Colon = 3                            (  entire mucosa seen well with no residual staining,                            small fragments of stool or opaque liquid). The                            total BBPS score equals 9. Scope In: 9:49:41 AM Scope Out: 10:08:08 AM Scope Withdrawal Time: 0 hours 8 minutes 12 seconds  Total Procedure Duration: 0 hours 18 minutes 27 seconds  Findings:      The digital rectal exam findings include decreased sphincter tone.      A single small angioectasia without bleeding was found in the cecum.      The exam was otherwise normal throughout the examined colon.      Retroflexion in the rectum was not performed due to anatomy. Impression:               - Decreased sphincter tone found on digital rectal                            exam.                           - A single non-bleeding colonic angioectasia.                           - No specimens collected. Moderate Sedation:      MAC sedation used Recommendation:           - Patient has a  contact number available for                            emergencies. The signs and symptoms of potential                            delayed complications were discussed with the                            patient. Return to normal activities tomorrow.                            Written discharge instructions were provided to the                            patient.                           - Resume previous diet.                           - Continue present medications.                           - No recommendation at this time regarding repeat                            colonoscopy.                           -  Return to primary care physician as previously                            scheduled for management of chronic iron                            supplementation. Procedure Code(s):        --- Professional ---                           [phone], Colonoscopy, flexible; diagnostic, including                            collection of specimen(s) by brushing or washing,                            when performed (separate procedure) Diagnosis Code(s):        --- Professional ---                           K62.89, Other specified diseases of anus and rectum                           K55.20, Angiodysplasia of colon without hemorrhage                           D50.9, Iron deficiency anemia, unspecified CPT copyright 2017 American Medical Association. All rights reserved. The codes documented in this report are preliminary and upon coder review may  be revised to meet current compliance requirements. [name] L. Loletha Carrow, MD 07/19/2017 10:21:44 AM This report has been signed electronically. Number of Addenda: 0

## 2017-07-19 NOTE — Interval H&P Note (Signed)
History and Physical Interval Note:  07/19/2017 9:24 AM  Abigail [facility]  has presented today for surgery, with the diagnosis of Iron deficiency anemia tt  The various methods of treatment have been discussed with the patient and family. After consideration of risks, benefits and other options for treatment, the patient has consented to  Procedure(s): ESOPHAGOGASTRODUODENOSCOPY (EGD) WITH PROPOFOL (N/A) COLONOSCOPY WITH PROPOFOL (N/A) as a surgical intervention .  The patient's history has been reviewed, patient examined, no change in status, stable for surgery.  I have reviewed the patient's chart and labs.  Questions were answered to the patient's satisfaction.     [name]
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
## 2017-07-19 NOTE — Anesthesia Postprocedure Evaluation (Signed)
Anesthesia Post Note  Patient: Nonda Lou  Procedure(s) Performed: ESOPHAGOGASTRODUODENOSCOPY (EGD) WITH PROPOFOL (N/A ) COLONOSCOPY WITH PROPOFOL (N/A ) BIOPSY     Patient location during evaluation: Endoscopy Anesthesia Type: MAC Level of consciousness: awake and alert Pain management: pain level controlled Vital Signs Assessment: post-procedure vital signs reviewed and stable Respiratory status: spontaneous breathing, nonlabored ventilation, respiratory function stable and patient connected to nasal cannula oxygen Cardiovascular status: blood pressure returned to baseline and stable Postop Assessment: no apparent nausea or vomiting Anesthetic complications: no    Last Vitals:  Vitals:   07/19/17 1030 07/19/17 1040  BP: (!) 130/44 (!) 143/50  Pulse: (!) 56 (!) 54  Resp: 12 11  Temp:    SpO2: 100% 98%    Last Pain:  Vitals:   07/19/17 1040  TempSrc:   PainSc: 0-No pain                 Alonie Gazzola DANIEL

## 2017-07-19 NOTE — Anesthesia Preprocedure Evaluation (Addendum)
Anesthesia Evaluation  Patient identified by MRN, date of birth, ID band Patient awake    Reviewed: Allergy & Precautions, NPO status , Patient's Chart, lab work & pertinent test results  History of Anesthesia Complications Negative for: history of anesthetic complications  Airway Mallampati: II  TM Distance: >3 FB Neck ROM: Full    Dental  (+) Dental Advisory Given, Poor Dentition, Chipped, Missing   Pulmonary sleep apnea ,    Pulmonary exam normal        Cardiovascular hypertension, Pt. on medications + Past MI  Normal cardiovascular exam     Neuro/Psych PSYCHIATRIC DISORDERS Depression negative neurological ROS  negative psych ROS   GI/Hepatic Neg liver ROS, GERD  ,  Endo/Other  diabetesHypothyroidism   Renal/GU negative Renal ROS  negative genitourinary   Musculoskeletal negative musculoskeletal ROS (+)   Abdominal   Peds negative pediatric ROS (+)  Hematology negative hematology ROS (+) anemia ,   Anesthesia Other Findings   Reproductive/Obstetrics negative OB ROS                            Anesthesia Physical Anesthesia Plan  ASA: III  Anesthesia Plan: MAC   Post-op Pain Management:    Induction: Intravenous  PONV Risk Score and Plan: 2 and Ondansetron and Propofol infusion  Airway Management Planned: Natural Airway  Additional Equipment:   Intra-op Plan:   Post-operative Plan:   Informed Consent: I have reviewed the patients History and Physical, chart, labs and discussed the procedure including the risks, benefits and alternatives for the proposed anesthesia with the patient or authorized representative who has indicated his/her understanding and acceptance.   Dental advisory given  Plan Discussed with: Anesthesiologist  Anesthesia Plan Comments:         Anesthesia Quick Evaluation

## 2017-07-19 NOTE — Op Note (Signed)
[facility] Patient Name: Abigail [facility] Procedure Date: 07/19/2017 MRN: [id number] Attending MD: Estill Cotta. Loletha Carrow , MD Date of Birth: 03/30/1934 CSN: [id number] Age: 83 Admit Type: Outpatient Procedure:                Upper GI endoscopy Indications:              Iron deficiency anemia Providers:                Mallie Mussel L. Loletha Carrow, MD, Cleda Daub, RN, Charolette Child, Technician, Danley Danker, CRNA Referring MD:             Gildardo Cranker, DO Medicines:                Monitored Anesthesia Care Complications:            No immediate complications. Estimated Blood Loss:     Estimated blood loss was minimal. Procedure:                Pre-Anesthesia Assessment:                           - Prior to the procedure, a History and Physical                            was performed, and patient medications and                            allergies were reviewed. The patient's tolerance of                            previous anesthesia was also reviewed. The risks                            and benefits of the procedure and the sedation                            options and risks were discussed with the patient.                            All questions were answered, and informed consent                            was obtained. Prior Anticoagulants: The patient has                            taken no previous anticoagulant or antiplatelet                            agents. ASA Grade Assessment: III - A patient with                            severe systemic disease. After reviewing the risks  and benefits, the patient was deemed in                            satisfactory condition to undergo the procedure.                           After obtaining informed consent, the endoscope was                            passed under direct vision. Throughout the                            procedure, the patient's blood pressure, pulse, and                      oxygen saturations were monitored continuously. The                            [id number] ([id number]) scope was introduced through the                            mouth, and advanced to the third part of duodenum.                            The upper GI endoscopy was accomplished without                            difficulty. The patient tolerated the procedure                            well. Scope In: Scope Out: Findings:      A small hiatal hernia was present.      Patchy mildly erythematous mucosa was found in the gastric antrum.      The cardia and gastric fundus were normal on retroflexion.      The exam of the stomach was otherwise normal.      Patchy mucosal changes characterized by scalloping were found in the       second portion of the duodenum. Biopsies for histology were taken with a       cold forceps for evaluation of celiac disease.      A large diverticulum was found in the third portion of the duodenum. Impression:               - Small hiatal hernia.                           - Erythematous mucosa in the antrum. Nonspecific -                            no apparent clinical significance.                           - Mucosal changes in the duodenum. Also nonspecific                            and of  questionable clinical significance. Biopsied.                           - Duodenal diverticulum. Moderate Sedation:      MAC sedation used Recommendation:           - Patient has a contact number available for                            emergencies. The signs and symptoms of potential                            delayed complications were discussed with the                            patient. Return to normal activities tomorrow.                            Written discharge instructions were provided to the                            patient.                           - Resume previous diet.                           - Continue present medications.                            - Await pathology results.                           - See the other procedure note for documentation of                            additional recommendations. Procedure Code(s):        --- Professional ---                           [phone], Esophagogastroduodenoscopy, flexible,                            transoral; with biopsy, single or multiple Diagnosis Code(s):        --- Professional ---                           K44.9, Diaphragmatic hernia without obstruction or                            gangrene                           K31.89, Other diseases of stomach and duodenum                           D50.9, Iron deficiency anemia, unspecified  K57.10, Diverticulosis of small intestine without                            perforation or abscess without bleeding CPT copyright 2017 American Medical Association. All rights reserved. The codes documented in this report are preliminary and upon coder review may  be revised to meet current compliance requirements. Henry L. Loletha Carrow, MD 07/19/2017 10:16:58 AM This report has been signed electronically. Number of Addenda: 0

## 2017-07-19 NOTE — Transfer of Care (Signed)
Immediate Anesthesia Transfer of Care Note  Patient: Abigail Edwards  Procedure(s) Performed: ESOPHAGOGASTRODUODENOSCOPY (EGD) WITH PROPOFOL (N/A ) COLONOSCOPY WITH PROPOFOL (N/A ) BIOPSY  Patient Location: Endoscopy Unit  Anesthesia Type:MAC  Level of Consciousness: awake  Airway & Oxygen Therapy: Patient Spontanous Breathing and Patient connected to nasal cannula oxygen  Post-op Assessment: Post -op Vital signs reviewed and stable  Post vital signs: Reviewed and stable  Last Vitals:  Vitals Value Taken Time  BP    Temp    Pulse 61 07/19/2017 10:14 AM  Resp 13 07/19/2017 10:14 AM  SpO2 98 % 07/19/2017 10:14 AM  Vitals shown include unvalidated device data.  Last Pain:  Vitals:   07/19/17 0905  TempSrc: Oral  PainSc: 0-No pain         Complications: No apparent anesthesia complications

## 2017-08-12 ENCOUNTER — Other Ambulatory Visit: Payer: Self-pay | Admitting: *Deleted

## 2017-08-12 DIAGNOSIS — G8929 Other chronic pain: Secondary | ICD-10-CM

## 2017-08-12 DIAGNOSIS — M5440 Lumbago with sciatica, unspecified side: Principal | ICD-10-CM

## 2017-08-12 MED ORDER — HYDROCODONE-ACETAMINOPHEN 10-325 MG PO TABS: ORAL_TABLET | ORAL | 0 refills | Status: DC

## 2017-08-28 ENCOUNTER — Other Ambulatory Visit: Payer: Self-pay | Admitting: Internal Medicine

## 2017-08-28 DIAGNOSIS — K219 Gastro-esophageal reflux disease without esophagitis: Secondary | ICD-10-CM

## 2017-09-02 DIAGNOSIS — M79676 Pain in unspecified toe(s): Secondary | ICD-10-CM | POA: Diagnosis not present

## 2017-09-02 DIAGNOSIS — L84 Corns and callosities: Secondary | ICD-10-CM | POA: Diagnosis not present

## 2017-09-02 DIAGNOSIS — B351 Tinea unguium: Secondary | ICD-10-CM | POA: Diagnosis not present

## 2017-09-02 DIAGNOSIS — E1142 Type 2 diabetes mellitus with diabetic polyneuropathy: Secondary | ICD-10-CM | POA: Diagnosis not present

## 2017-09-09 ENCOUNTER — Ambulatory Visit (INDEPENDENT_AMBULATORY_CARE_PROVIDER_SITE_OTHER): Payer: Medicare Other | Admitting: Internal Medicine

## 2017-09-09 ENCOUNTER — Encounter: Payer: Self-pay | Admitting: Internal Medicine

## 2017-09-09 VITALS — BP 124/70 | HR 54 | Temp 98.4°F | Ht 63.0 in | Wt 158.0 lb

## 2017-09-09 DIAGNOSIS — G8929 Other chronic pain: Secondary | ICD-10-CM

## 2017-09-09 DIAGNOSIS — E1169 Type 2 diabetes mellitus with other specified complication: Secondary | ICD-10-CM | POA: Diagnosis not present

## 2017-09-09 DIAGNOSIS — I10 Essential (primary) hypertension: Secondary | ICD-10-CM

## 2017-09-09 DIAGNOSIS — D509 Iron deficiency anemia, unspecified: Secondary | ICD-10-CM

## 2017-09-09 DIAGNOSIS — S29011A Strain of muscle and tendon of front wall of thorax, initial encounter: Secondary | ICD-10-CM | POA: Diagnosis not present

## 2017-09-09 DIAGNOSIS — M5440 Lumbago with sciatica, unspecified side: Secondary | ICD-10-CM

## 2017-09-09 DIAGNOSIS — E785 Hyperlipidemia, unspecified: Secondary | ICD-10-CM | POA: Diagnosis not present

## 2017-09-09 DIAGNOSIS — E1140 Type 2 diabetes mellitus with diabetic neuropathy, unspecified: Secondary | ICD-10-CM

## 2017-09-09 DIAGNOSIS — F1190 Opioid use, unspecified, uncomplicated: Secondary | ICD-10-CM | POA: Diagnosis not present

## 2017-09-09 DIAGNOSIS — E034 Atrophy of thyroid (acquired): Secondary | ICD-10-CM | POA: Diagnosis not present

## 2017-09-09 MED ORDER — HYDROCODONE-ACETAMINOPHEN 10-325 MG PO TABS: ORAL_TABLET | ORAL | 0 refills | Status: DC
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
## 2017-09-09 NOTE — Patient Instructions (Addendum)
Continue current medications as ordered  Apply warm compress to left chest wall as needed for pain  Will call with lab results  Follow up with specialists as scheduled  Follow up in 6 mos for fasting EV/ECG with Abigail Edwards.

## 2017-09-10 LAB — CBC WITH DIFFERENTIAL/PLATELET
Basophils Absolute: 59 cells/uL (ref 0–200)
Basophils Relative: 0.6 %
Eosinophils Absolute: 402 cells/uL (ref 15–500)
Eosinophils Relative: 4.1 %
HCT: 41.7 % (ref 35.0–45.0)
Hemoglobin: 13.4 g/dL (ref 11.7–15.5)
Lymphs Abs: 2832 cells/uL (ref 850–3900)
MCH: 26.1 pg — ABNORMAL LOW (ref 27.0–33.0)
MCHC: 32.1 g/dL (ref 32.0–36.0)
MCV: 81.1 fL (ref 80.0–100.0)
MPV: 10.1 fL (ref 7.5–12.5)
Monocytes Relative: 11 %
Neutro Abs: 5429 cells/uL (ref 1500–7800)
Neutrophils Relative %: 55.4 %
Platelets: 377 10*3/uL (ref 140–400)
RBC: 5.14 10*6/uL — ABNORMAL HIGH (ref 3.80–5.10)
RDW: 17.9 % — ABNORMAL HIGH (ref 11.0–15.0)
Total Lymphocyte: 28.9 %
WBC mixed population: 1078 cells/uL — ABNORMAL HIGH (ref 200–950)
WBC: 9.8 10*3/uL (ref 3.8–10.8)

## 2017-09-10 LAB — IRON: Iron: 72 ug/dL (ref 45–160)

## 2017-09-10 LAB — TSH: TSH: 3.4 mIU/L (ref 0.40–4.50)

## 2017-09-10 LAB — BASIC METABOLIC PANEL WITH GFR
BUN: 8 mg/dL (ref 7–25)
CO2: 34 mmol/L — ABNORMAL HIGH (ref 20–32)
Calcium: 9.1 mg/dL (ref 8.6–10.4)
Chloride: 90 mmol/L — ABNORMAL LOW (ref 98–110)
Creat: 0.61 mg/dL (ref 0.60–0.88)
GFR, Est African American: 99 mL/min/{1.73_m2} (ref >=60)
GFR, Est Non African American: 86 mL/min/{1.73_m2} (ref >=60)
Glucose, Bld: 77 mg/dL (ref 65–99)
Potassium: 4.1 mmol/L (ref 3.5–5.3)
Sodium: 130 mmol/L — ABNORMAL LOW (ref 135–146)

## 2017-09-10 LAB — ALT: ALT: 9 U/L (ref 6–29)

## 2017-09-10 LAB — HEMOGLOBIN A1C
Hgb A1c MFr Bld: 5.5 % of total Hgb (ref <5.7)
Mean Plasma Glucose: 111 (calc)
eAG (mmol/L): 6.2 (calc)

## 2017-09-10 LAB — FERRITIN: Ferritin: 22 ng/mL (ref 16–288)

## 2017-09-19 ENCOUNTER — Other Ambulatory Visit: Payer: Self-pay | Admitting: Internal Medicine

## 2017-09-25 ENCOUNTER — Other Ambulatory Visit: Payer: Self-pay | Admitting: Internal Medicine

## 2017-10-10 ENCOUNTER — Other Ambulatory Visit: Payer: Self-pay | Admitting: *Deleted

## 2017-10-10 DIAGNOSIS — M5440 Lumbago with sciatica, unspecified side: Principal | ICD-10-CM

## 2017-10-10 DIAGNOSIS — G8929 Other chronic pain: Secondary | ICD-10-CM

## 2017-10-10 MED ORDER — HYDROCODONE-ACETAMINOPHEN 10-325 MG PO TABS: ORAL_TABLET | ORAL | 0 refills | Status: DC

## 2017-10-10 NOTE — Telephone Encounter (Signed)
Patient requested refill [place] Verified LR: 09/09/2017 Pharmacy Confirmed Pended Rx and sent to Dr. Eulas Post for approval.

## 2017-10-12 ENCOUNTER — Encounter: Payer: Self-pay | Admitting: Internal Medicine

## 2017-10-21 ENCOUNTER — Telehealth: Payer: Self-pay

## 2017-10-21 NOTE — Telephone Encounter (Signed)
Patient called to say that she has a rash on both forearms that is itching, burning, and swelling. I attempted to call patient back to get more details but there was no answer.   Patient did ask to be seen by Dr Eulas Post next week if possible, however, there are no available appointments with any of the office providers next week.   Please advise with any recommendations.

## 2017-10-21 NOTE — Telephone Encounter (Signed)
It sounds like she needs to get evaluated at urgent care

## 2017-10-21 NOTE — Telephone Encounter (Signed)
Patient verbalized understanding but she was not happy that she could not be seen in the office.

## 2017-11-06 ENCOUNTER — Other Ambulatory Visit: Payer: Self-pay | Admitting: Internal Medicine

## 2017-11-07 ENCOUNTER — Other Ambulatory Visit: Payer: Self-pay | Admitting: *Deleted

## 2017-11-07 DIAGNOSIS — M5440 Lumbago with sciatica, unspecified side: Principal | ICD-10-CM

## 2017-11-07 DIAGNOSIS — G8929 Other chronic pain: Secondary | ICD-10-CM

## 2017-11-07 NOTE — Telephone Encounter (Signed)
Patient requested refill [place] Verified LR: 10/10/2017 Pharmacy Confirmed Pended Rx and sent to Dr. Eulas Post for approval.

## 2017-11-08 MED ORDER — HYDROCODONE-ACETAMINOPHEN 10-325 MG PO TABS: ORAL_TABLET | ORAL | 0 refills | Status: DC

## 2017-11-25 ENCOUNTER — Other Ambulatory Visit: Payer: Self-pay | Admitting: Internal Medicine

## 2017-11-30 DIAGNOSIS — L84 Corns and callosities: Secondary | ICD-10-CM | POA: Diagnosis not present

## 2017-11-30 DIAGNOSIS — B351 Tinea unguium: Secondary | ICD-10-CM | POA: Diagnosis not present

## 2017-11-30 DIAGNOSIS — M79676 Pain in unspecified toe(s): Secondary | ICD-10-CM | POA: Diagnosis not present

## 2017-11-30 DIAGNOSIS — E1142 Type 2 diabetes mellitus with diabetic polyneuropathy: Secondary | ICD-10-CM | POA: Diagnosis not present

## 2017-12-06 ENCOUNTER — Other Ambulatory Visit: Payer: Self-pay | Admitting: *Deleted

## 2017-12-06 DIAGNOSIS — M5440 Lumbago with sciatica, unspecified side: Principal | ICD-10-CM

## 2017-12-06 DIAGNOSIS — G8929 Other chronic pain: Secondary | ICD-10-CM

## 2017-12-06 MED ORDER — HYDROCODONE-ACETAMINOPHEN 10-325 MG PO TABS: ORAL_TABLET | ORAL | 0 refills | Status: DC

## 2017-12-06 NOTE — Telephone Encounter (Signed)
Patient called and requested refill [place] Verified LR: 11/08/2017 Pharmacy Confirmed Pended Rx and sent to Dr. Eulas Post for approval.

## 2017-12-20 ENCOUNTER — Other Ambulatory Visit: Payer: Self-pay | Admitting: Internal Medicine

## 2017-12-22 ENCOUNTER — Other Ambulatory Visit: Payer: Self-pay | Admitting: Internal Medicine

## 2018-01-04 DIAGNOSIS — Z961 Presence of intraocular lens: Secondary | ICD-10-CM | POA: Diagnosis not present

## 2018-01-04 DIAGNOSIS — E1165 Type 2 diabetes mellitus with hyperglycemia: Secondary | ICD-10-CM | POA: Diagnosis not present

## 2018-01-04 DIAGNOSIS — H353112 Nonexudative age-related macular degeneration, right eye, intermediate dry stage: Secondary | ICD-10-CM | POA: Diagnosis not present

## 2018-01-04 DIAGNOSIS — H04123 Dry eye syndrome of bilateral lacrimal glands: Secondary | ICD-10-CM | POA: Diagnosis not present

## 2018-01-04 DIAGNOSIS — H40013 Open angle with borderline findings, low risk, bilateral: Secondary | ICD-10-CM | POA: Diagnosis not present

## 2018-01-04 DIAGNOSIS — H02834 Dermatochalasis of left upper eyelid: Secondary | ICD-10-CM | POA: Diagnosis not present

## 2018-01-04 DIAGNOSIS — H353222 Exudative age-related macular degeneration, left eye, with inactive choroidal neovascularization: Secondary | ICD-10-CM | POA: Diagnosis not present

## 2018-01-04 DIAGNOSIS — H02831 Dermatochalasis of right upper eyelid: Secondary | ICD-10-CM | POA: Diagnosis not present

## 2018-01-04 DIAGNOSIS — H43813 Vitreous degeneration, bilateral: Secondary | ICD-10-CM | POA: Diagnosis not present

## 2018-01-05 ENCOUNTER — Other Ambulatory Visit: Payer: Self-pay

## 2018-01-05 DIAGNOSIS — M5440 Lumbago with sciatica, unspecified side: Principal | ICD-10-CM

## 2018-01-05 DIAGNOSIS — G8929 Other chronic pain: Secondary | ICD-10-CM

## 2018-01-05 MED ORDER — HYDROCODONE-ACETAMINOPHEN 10-325 MG PO TABS: ORAL_TABLET | ORAL | 0 refills | Status: DC

## 2018-01-05 NOTE — Telephone Encounter (Addendum)
[place] Database verified and compliance confirmed   

## 2018-01-05 NOTE — Telephone Encounter (Signed)
Left message informing patient rx sent as requested

## 2018-02-03 ENCOUNTER — Other Ambulatory Visit: Payer: Self-pay | Admitting: *Deleted

## 2018-02-03 DIAGNOSIS — M5440 Lumbago with sciatica, unspecified side: Principal | ICD-10-CM

## 2018-02-03 DIAGNOSIS — G8929 Other chronic pain: Secondary | ICD-10-CM

## 2018-02-03 MED ORDER — HYDROCODONE-ACETAMINOPHEN 10-325 MG PO TABS: ORAL_TABLET | ORAL | 0 refills | Status: DC

## 2018-02-03 NOTE — Telephone Encounter (Signed)
Patient requested Rx [place] Verified LR: 01/05/2018 Pended Rx and sent to [facility] for Approval.

## 2018-02-13 ENCOUNTER — Other Ambulatory Visit: Payer: Self-pay | Admitting: *Deleted

## 2018-02-13 DIAGNOSIS — K219 Gastro-esophageal reflux disease without esophagitis: Secondary | ICD-10-CM

## 2018-02-13 MED ORDER — RANITIDINE HCL 150 MG PO TABS: 150.0000 mg | ORAL_TABLET | Freq: Every day | ORAL | 1 refills | Status: DC

## 2018-02-13 NOTE — Telephone Encounter (Signed)
[place]

## 2018-03-03 ENCOUNTER — Other Ambulatory Visit: Payer: Self-pay | Admitting: *Deleted

## 2018-03-03 DIAGNOSIS — G8929 Other chronic pain: Secondary | ICD-10-CM

## 2018-03-03 DIAGNOSIS — M5440 Lumbago with sciatica, unspecified side: Principal | ICD-10-CM

## 2018-03-03 MED ORDER — HYDROCODONE-ACETAMINOPHEN 10-325 MG PO TABS: ORAL_TABLET | ORAL | 0 refills | Status: DC

## 2018-03-03 NOTE — Telephone Encounter (Signed)
Patient requested refill [place] Verified LR: 02/03/2018 Pended Rx and sent to [facility] for approval.

## 2018-03-06 DIAGNOSIS — L84 Corns and callosities: Secondary | ICD-10-CM | POA: Diagnosis not present

## 2018-03-06 DIAGNOSIS — B351 Tinea unguium: Secondary | ICD-10-CM | POA: Diagnosis not present

## 2018-03-06 DIAGNOSIS — M79676 Pain in unspecified toe(s): Secondary | ICD-10-CM | POA: Diagnosis not present

## 2018-03-06 DIAGNOSIS — E1142 Type 2 diabetes mellitus with diabetic polyneuropathy: Secondary | ICD-10-CM | POA: Diagnosis not present

## 2018-03-09 ENCOUNTER — Telehealth: Payer: Self-pay

## 2018-03-09 NOTE — Telephone Encounter (Signed)
-----   Message from Lauree Chandler, NP sent at 03/09/2018 10:05 AM EST ----- This patient did not complete ifob, does she qualify for cologuard? ----- Message ----- From: SYSTEM Sent: 03/09/2018  12:07 AM EST To: Gildardo Cranker, DO

## 2018-03-09 NOTE — Telephone Encounter (Signed)
Patient has pending appointment on 03/13/2018, appointment notes updated to discuss cologuard with 6 month follow-up

## 2018-03-13 ENCOUNTER — Encounter: Payer: Medicare Other | Admitting: Nurse Practitioner

## 2018-03-16 ENCOUNTER — Ambulatory Visit: Payer: Medicare Other | Admitting: Nurse Practitioner

## 2018-03-24 ENCOUNTER — Other Ambulatory Visit: Payer: Self-pay

## 2018-03-24 ENCOUNTER — Other Ambulatory Visit: Payer: Self-pay | Admitting: Nurse Practitioner

## 2018-03-24 MED ORDER — NAPROXEN 500 MG PO TABS: ORAL_TABLET | ORAL | 0 refills | Status: DC

## 2018-03-24 MED ORDER — LEVOTHYROXINE SODIUM 125 MCG PO TABS: 125.0000 ug | ORAL_TABLET | Freq: Every day | ORAL | 0 refills | Status: DC

## 2018-03-24 MED ORDER — GABAPENTIN 300 MG PO CAPS: 300.0000 mg | ORAL_CAPSULE | Freq: Two times a day (BID) | ORAL | 0 refills | Status: DC

## 2018-03-24 NOTE — Telephone Encounter (Signed)
Last OV with Dr.Carter on 09/09/2017  Pending OV with Charlynne Cousins, NP on 03/27/2018  RX filled for 30 day supply only. Additional refills can be given once seen by Janett Billow.

## 2018-03-27 ENCOUNTER — Encounter: Payer: Self-pay | Admitting: Nurse Practitioner

## 2018-03-27 ENCOUNTER — Encounter: Payer: Medicare Other | Admitting: Nurse Practitioner

## 2018-03-27 ENCOUNTER — Ambulatory Visit: Payer: Self-pay

## 2018-03-27 ENCOUNTER — Encounter: Payer: Medicare Other | Admitting: Family

## 2018-03-27 ENCOUNTER — Ambulatory Visit (INDEPENDENT_AMBULATORY_CARE_PROVIDER_SITE_OTHER): Payer: Medicare Other | Admitting: Nurse Practitioner

## 2018-03-27 VITALS — BP 134/60 | HR 59 | Temp 97.5°F | Ht 63.0 in | Wt 159.2 lb

## 2018-03-27 DIAGNOSIS — M5440 Lumbago with sciatica, unspecified side: Secondary | ICD-10-CM

## 2018-03-27 DIAGNOSIS — E785 Hyperlipidemia, unspecified: Secondary | ICD-10-CM | POA: Diagnosis not present

## 2018-03-27 DIAGNOSIS — M5441 Lumbago with sciatica, right side: Secondary | ICD-10-CM

## 2018-03-27 DIAGNOSIS — I10 Essential (primary) hypertension: Secondary | ICD-10-CM

## 2018-03-27 DIAGNOSIS — G8929 Other chronic pain: Secondary | ICD-10-CM

## 2018-03-27 DIAGNOSIS — E1169 Type 2 diabetes mellitus with other specified complication: Secondary | ICD-10-CM

## 2018-03-27 DIAGNOSIS — F329 Major depressive disorder, single episode, unspecified: Secondary | ICD-10-CM | POA: Diagnosis not present

## 2018-03-27 DIAGNOSIS — E1140 Type 2 diabetes mellitus with diabetic neuropathy, unspecified: Secondary | ICD-10-CM | POA: Diagnosis not present

## 2018-03-27 DIAGNOSIS — D509 Iron deficiency anemia, unspecified: Secondary | ICD-10-CM | POA: Diagnosis not present

## 2018-03-27 DIAGNOSIS — E034 Atrophy of thyroid (acquired): Secondary | ICD-10-CM

## 2018-03-27 DIAGNOSIS — F32A Depression, unspecified: Secondary | ICD-10-CM

## 2018-03-27 MED ORDER — FUSION PLUS PO CAPS: ORAL_CAPSULE | ORAL | 3 refills | Status: AC

## 2018-03-27 MED ORDER — DULOXETINE HCL 30 MG PO CPEP: 30.0000 mg | ORAL_CAPSULE | Freq: Every day | ORAL | 3 refills | Status: DC

## 2018-03-27 NOTE — Patient Instructions (Addendum)
To decrease celexa to 20 mg daily for 2 weeks then stop To start cymbalta to 30 mg by mouth daily for 2 week then increase to 60 mg daily  STOP naproxen  Make follow up with Dental clinic.   Look into fusion plus- this is OTC and tolerated better   Recommended to take caltrate with D 600/400 twice a day due to osteoporosis- we are getting a prior auth for prolia.   Follow up in 6 weeks for AWV followed by a follow up anxiety/depression appt
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
## 2018-03-28 ENCOUNTER — Other Ambulatory Visit: Payer: Self-pay | Admitting: *Deleted

## 2018-03-28 LAB — HEMOGLOBIN A1C
Hgb A1c MFr Bld: 5.5 % of total Hgb (ref <5.7)
Mean Plasma Glucose: 111 (calc)
eAG (mmol/L): 6.2 (calc)

## 2018-03-28 LAB — COMPLETE METABOLIC PANEL WITH GFR
AG Ratio: 1.4 (calc) (ref 1.0–2.5)
ALT: 7 U/L (ref 6–29)
AST: 16 U/L (ref 10–35)
Albumin: 4.2 g/dL (ref 3.6–5.1)
Alkaline phosphatase (APISO): 77 U/L (ref 37–153)
BUN/Creatinine Ratio: 17 (calc) (ref 6–22)
BUN: 9 mg/dL (ref 7–25)
CO2: 35 mmol/L — ABNORMAL HIGH (ref 20–32)
Calcium: 9.4 mg/dL (ref 8.6–10.4)
Chloride: 90 mmol/L — ABNORMAL LOW (ref 98–110)
Creat: 0.52 mg/dL — ABNORMAL LOW (ref 0.60–0.88)
GFR, Est African American: 104 mL/min/{1.73_m2} (ref >=60)
GFR, Est Non African American: 90 mL/min/{1.73_m2} (ref >=60)
Globulin: 2.9 g/dL (calc) (ref 1.9–3.7)
Glucose, Bld: 83 mg/dL (ref 65–99)
Potassium: 3.9 mmol/L (ref 3.5–5.3)
Sodium: 133 mmol/L — ABNORMAL LOW (ref 135–146)
Total Bilirubin: 0.5 mg/dL (ref 0.2–1.2)
Total Protein: 7.1 g/dL (ref 6.1–8.1)

## 2018-03-28 LAB — CBC WITH DIFFERENTIAL/PLATELET
Absolute Monocytes: 1172 cells/uL — ABNORMAL HIGH (ref 200–950)
Basophils Absolute: 61 cells/uL (ref 0–200)
Basophils Relative: 0.6 %
Eosinophils Absolute: 323 cells/uL (ref 15–500)
Eosinophils Relative: 3.2 %
HCT: 43.1 % (ref 35.0–45.0)
Hemoglobin: 14 g/dL (ref 11.7–15.5)
Lymphs Abs: 2606 cells/uL (ref 850–3900)
MCH: 26.2 pg — ABNORMAL LOW (ref 27.0–33.0)
MCHC: 32.5 g/dL (ref 32.0–36.0)
MCV: 80.7 fL (ref 80.0–100.0)
MPV: 10.5 fL (ref 7.5–12.5)
Monocytes Relative: 11.6 %
Neutro Abs: 5939 cells/uL (ref 1500–7800)
Neutrophils Relative %: 58.8 %
Platelets: 378 10*3/uL (ref 140–400)
RBC: 5.34 10*6/uL — ABNORMAL HIGH (ref 3.80–5.10)
RDW: 14.7 % (ref 11.0–15.0)
Total Lymphocyte: 25.8 %
WBC: 10.1 10*3/uL (ref 3.8–10.8)

## 2018-03-28 LAB — MICROALBUMIN, URINE: Microalb, Ur: <0.2 mg/dL

## 2018-03-28 LAB — LIPID PANEL
Cholesterol: 200 mg/dL — ABNORMAL HIGH (ref <200)
HDL: 54 mg/dL (ref >=50)
LDL Cholesterol (Calc): 117 mg/dL (calc) — ABNORMAL HIGH
Non-HDL Cholesterol (Calc): 146 mg/dL (calc) — ABNORMAL HIGH (ref <130)
Total CHOL/HDL Ratio: 3.7 (calc) (ref <5.0)
Triglycerides: 172 mg/dL — ABNORMAL HIGH (ref <150)

## 2018-03-28 LAB — TSH: TSH: 1.51 mIU/L (ref 0.40–4.50)

## 2018-03-28 MED ORDER — POTASSIUM CHLORIDE CRYS ER 10 MEQ PO TBCR: 10.0000 meq | EXTENDED_RELEASE_TABLET | Freq: Two times a day (BID) | ORAL | 0 refills | Status: DC

## 2018-03-28 NOTE — Telephone Encounter (Signed)
[facility] requested refill  Pended rx and sent to [facility] for approval due to [employer].

## 2018-03-29 ENCOUNTER — Other Ambulatory Visit: Payer: Self-pay

## 2018-03-29 MED ORDER — ATORVASTATIN CALCIUM 10 MG PO TABS: 10.0000 mg | ORAL_TABLET | Freq: Every day | ORAL | 5 refills | Status: DC

## 2018-03-31 ENCOUNTER — Other Ambulatory Visit: Payer: Self-pay | Admitting: *Deleted

## 2018-03-31 DIAGNOSIS — G8929 Other chronic pain: Secondary | ICD-10-CM

## 2018-03-31 DIAGNOSIS — M5440 Lumbago with sciatica, unspecified side: Principal | ICD-10-CM

## 2018-03-31 MED ORDER — HYDROCODONE-ACETAMINOPHEN 10-325 MG PO TABS: ORAL_TABLET | ORAL | 0 refills | Status: DC

## 2018-03-31 NOTE — Telephone Encounter (Signed)
Patient requested refill [place] Verified LR: 03/04/2018 Pended Rx and sent to [facility] for Approval.

## 2018-04-03 ENCOUNTER — Telehealth: Payer: Self-pay

## 2018-04-03 DIAGNOSIS — M5440 Lumbago with sciatica, unspecified side: Principal | ICD-10-CM

## 2018-04-03 DIAGNOSIS — G8929 Other chronic pain: Secondary | ICD-10-CM

## 2018-04-03 DIAGNOSIS — F329 Major depressive disorder, single episode, unspecified: Secondary | ICD-10-CM

## 2018-04-03 DIAGNOSIS — F32A Depression, unspecified: Secondary | ICD-10-CM

## 2018-04-03 MED ORDER — DULOXETINE HCL 60 MG PO CPEP: 60.0000 mg | ORAL_CAPSULE | Freq: Every day | ORAL | 3 refills | Status: DC

## 2018-04-03 MED ORDER — DULOXETINE HCL 30 MG PO CPEP: 30.0000 mg | ORAL_CAPSULE | Freq: Every day | ORAL | 0 refills | Status: DC

## 2018-04-03 NOTE — Telephone Encounter (Signed)
Patient states she is on the 2nd week of decreased dose of celexa. Patient needs rx's for cymbalta per Jessica's instructions at last OV (cymbalta will replace celexa)   RX's sent to pharmacy for Cymbalta according to [facility] instructions (refer to [facility] encounter dated 03/27/2018)

## 2018-05-01 ENCOUNTER — Other Ambulatory Visit: Payer: Self-pay | Admitting: *Deleted

## 2018-05-01 DIAGNOSIS — M5440 Lumbago with sciatica, unspecified side: Principal | ICD-10-CM

## 2018-05-01 DIAGNOSIS — G8929 Other chronic pain: Secondary | ICD-10-CM

## 2018-05-01 MED ORDER — HYDROCODONE-ACETAMINOPHEN 10-325 MG PO TABS: ORAL_TABLET | ORAL | 0 refills | Status: DC

## 2018-05-01 NOTE — Telephone Encounter (Signed)
Patient requested refill [facility] Verified LR: 03/31/2018 Pended Rx and sent to [facility] for approval.

## 2018-05-08 ENCOUNTER — Other Ambulatory Visit: Payer: Self-pay

## 2018-05-08 ENCOUNTER — Ambulatory Visit (INDEPENDENT_AMBULATORY_CARE_PROVIDER_SITE_OTHER): Payer: Medicare Other | Admitting: Nurse Practitioner

## 2018-05-08 ENCOUNTER — Encounter: Payer: Self-pay | Admitting: Nurse Practitioner

## 2018-05-08 ENCOUNTER — Ambulatory Visit: Payer: Medicare Other | Admitting: Nurse Practitioner

## 2018-05-08 VITALS — BP 114/60 | HR 76 | Temp 97.5°F | Ht 63.0 in | Wt 163.0 lb

## 2018-05-08 DIAGNOSIS — F329 Major depressive disorder, single episode, unspecified: Secondary | ICD-10-CM

## 2018-05-08 DIAGNOSIS — Z0000 Encounter for general adult medical examination without abnormal findings: Secondary | ICD-10-CM | POA: Diagnosis not present

## 2018-05-08 DIAGNOSIS — E1169 Type 2 diabetes mellitus with other specified complication: Secondary | ICD-10-CM

## 2018-05-08 DIAGNOSIS — R059 Cough, unspecified: Secondary | ICD-10-CM

## 2018-05-08 DIAGNOSIS — E785 Hyperlipidemia, unspecified: Secondary | ICD-10-CM | POA: Diagnosis not present

## 2018-05-08 DIAGNOSIS — M810 Age-related osteoporosis without current pathological fracture: Secondary | ICD-10-CM | POA: Diagnosis not present

## 2018-05-08 DIAGNOSIS — M5440 Lumbago with sciatica, unspecified side: Secondary | ICD-10-CM | POA: Diagnosis not present

## 2018-05-08 DIAGNOSIS — F32A Depression, unspecified: Secondary | ICD-10-CM

## 2018-05-08 DIAGNOSIS — R05 Cough: Secondary | ICD-10-CM

## 2018-05-08 DIAGNOSIS — G8929 Other chronic pain: Secondary | ICD-10-CM

## 2018-05-08 LAB — COMPLETE METABOLIC PANEL WITH GFR
AG Ratio: 1.3 (calc) (ref 1.0–2.5)
ALT: 9 U/L (ref 6–29)
AST: 16 U/L (ref 10–35)
Albumin: 3.7 g/dL (ref 3.6–5.1)
Alkaline phosphatase (APISO): 70 U/L (ref 37–153)
BUN/Creatinine Ratio: 23 (calc) — ABNORMAL HIGH (ref 6–22)
BUN: 13 mg/dL (ref 7–25)
CO2: 35 mmol/L — ABNORMAL HIGH (ref 20–32)
Calcium: 9.3 mg/dL (ref 8.6–10.4)
Chloride: 90 mmol/L — ABNORMAL LOW (ref 98–110)
Creat: 0.57 mg/dL — ABNORMAL LOW (ref 0.60–0.88)
GFR, Est African American: 101 mL/min/{1.73_m2} (ref >=60)
GFR, Est Non African American: 87 mL/min/{1.73_m2} (ref >=60)
Globulin: 2.9 g/dL (calc) (ref 1.9–3.7)
Glucose, Bld: 71 mg/dL (ref 65–139)
Potassium: 3.9 mmol/L (ref 3.5–5.3)
Sodium: 132 mmol/L — ABNORMAL LOW (ref 135–146)
Total Bilirubin: 0.4 mg/dL (ref 0.2–1.2)
Total Protein: 6.6 g/dL (ref 6.1–8.1)

## 2018-05-08 LAB — LIPID PANEL
Cholesterol: 176 mg/dL (ref <200)
HDL: 48 mg/dL — ABNORMAL LOW (ref >=50)
LDL Cholesterol (Calc): 103 mg/dL (calc) — ABNORMAL HIGH
Non-HDL Cholesterol (Calc): 128 mg/dL (calc) (ref <130)
Total CHOL/HDL Ratio: 3.7 (calc) (ref <5.0)
Triglycerides: 145 mg/dL (ref <150)

## 2018-05-08 MED ORDER — DULOXETINE HCL 60 MG PO CPEP: 60.0000 mg | ORAL_CAPSULE | Freq: Every day | ORAL | 1 refills | Status: DC

## 2018-05-08 MED ORDER — ZOSTER VAC RECOMB ADJUVANTED 50 MCG/0.5ML IM SUSR: 0.5000 mL | Freq: Once | INTRAMUSCULAR | 1 refills | Status: AC
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
## 2018-05-08 NOTE — Patient Instructions (Signed)
[name] , Thank you for taking time to come for your Medicare Wellness Visit. I appreciate your ongoing commitment to your health goals. Please review the following plan we discussed and let me know if I can assist you in the future.   Screening recommendations/referrals: Colonoscopy up to date Mammogram up to date Bone Density declines Recommended yearly ophthalmology/optometry visit for glaucoma screening and checkup Recommended yearly dental visit for hygiene and checkup  Vaccinations: Influenza vaccine declines Pneumococcal vaccine up to date Tdap vaccine up to date Shingles vaccine will send to pharmacy    Advanced directives: completed and bring back to office on follow up  Conditions/risks identified: risk for fracture due to OP  Next appointment: 1 year for AWV   Preventive Care 5 Years and Older, Female Preventive care refers to lifestyle choices and visits with your health care provider that can promote health and wellness. What does preventive care include?  A yearly physical exam. This is also called an annual well check.  Dental exams once or twice a year.  Routine eye exams. Ask your health care provider how often you should have your eyes checked.  Personal lifestyle choices, including:  Daily care of your teeth and gums.  Regular physical activity.  Eating a healthy diet.  Avoiding tobacco and drug use.  Limiting alcohol use.  Practicing safe sex.  Taking low-dose aspirin every day.  Taking vitamin and mineral supplements as recommended by your health care provider. What happens during an annual well check? The services and screenings done by your health care provider during your annual well check will depend on your age, overall health, lifestyle risk factors, and family history of disease. Counseling  Your health care provider may ask you questions about your:  Alcohol use.  Tobacco use.  Drug use.  Emotional well-being.  Home and  relationship well-being.  Sexual activity.  Eating habits.  History of falls.  Memory and ability to understand (cognition).  Work and work Statistician.  Reproductive health. Screening  You may have the following tests or measurements:  Height, weight, and BMI.  Blood pressure.  Lipid and cholesterol levels. These may be checked every 5 years, or more frequently if you are over 72 years old.  Skin check.  Lung cancer screening. You may have this screening every year starting at age 22 if you have a 30-pack-year history of smoking and currently smoke or have quit within the past 15 years.  Fecal occult blood test (FOBT) of the stool. You may have this test every year starting at age 5.  Flexible sigmoidoscopy or colonoscopy. You may have a sigmoidoscopy every 5 years or a colonoscopy every 10 years starting at age 87.  Hepatitis C blood test.  Hepatitis B blood test.  Sexually transmitted disease (STD) testing.  Diabetes screening. This is done by checking your blood sugar (glucose) after you have not eaten for a while (fasting). You may have this done every 1-3 years.  Bone density scan. This is done to screen for osteoporosis. You may have this done starting at age 2.  Mammogram. This may be done every 1-2 years. Talk to your health care provider about how often you should have regular mammograms. Talk with your health care provider about your test results, treatment options, and if necessary, the need for more tests. Vaccines  Your health care provider may recommend certain vaccines, such as:  Influenza vaccine. This is recommended every year.  Tetanus, diphtheria, and acellular pertussis (Tdap, Td)  vaccine. You may need a Td booster every 10 years.  Zoster vaccine. You may need this after age 50.  Pneumococcal 13-valent conjugate (PCV13) vaccine. One dose is recommended after age 1.  Pneumococcal polysaccharide (PPSV23) vaccine. One dose is recommended after  age 29. Talk to your health care provider about which screenings and vaccines you need and how often you need them. This information is not intended to replace advice given to you by your health care provider. Make sure you discuss any questions you have with your health care provider. Document Released: 03/07/2015 Document Revised: 10/29/2015 Document Reviewed: 12/10/2014 Elsevier Interactive Patient Education  2017 [place] Prevention in the Home Falls can cause injuries. They can happen to people of all ages. There are many things you can do to make your home safe and to help prevent falls. What can I do on the outside of my home?  Regularly fix the edges of walkways and driveways and fix any cracks.  Remove anything that might make you trip as you walk through a door, such as a raised step or threshold.  Trim any bushes or trees on the path to your home.  Use bright outdoor lighting.  Clear any walking paths of anything that might make someone trip, such as rocks or tools.  Regularly check to see if handrails are loose or broken. Make sure that both sides of any steps have handrails.  Any raised decks and porches should have guardrails on the edges.  Have any leaves, snow, or ice cleared regularly.  Use sand or salt on walking paths during winter.  Clean up any spills in your garage right away. This includes oil or grease spills. What can I do in the bathroom?  Use night lights.  Install grab bars by the toilet and in the tub and shower. Do not use towel bars as grab bars.  Use non-skid mats or decals in the tub or shower.  If you need to sit down in the shower, use a plastic, non-slip stool.  Keep the floor dry. Clean up any water that spills on the floor as soon as it happens.  Remove soap buildup in the tub or shower regularly.  Attach bath mats securely with double-sided non-slip rug tape.  Do not have throw rugs and other things on the floor that can  make you trip. What can I do in the bedroom?  Use night lights.  Make sure that you have a light by your bed that is easy to reach.  Do not use any sheets or blankets that are too big for your bed. They should not hang down onto the floor.  Have a firm chair that has side arms. You can use this for support while you get dressed.  Do not have throw rugs and other things on the floor that can make you trip. What can I do in the kitchen?  Clean up any spills right away.  Avoid walking on wet floors.  Keep items that you use a lot in easy-to-reach places.  If you need to reach something above you, use a strong step stool that has a grab bar.  Keep electrical cords out of the way.  Do not use floor polish or wax that makes floors slippery. If you must use wax, use non-skid floor wax.  Do not have throw rugs and other things on the floor that can make you trip. What can I do with my stairs?  Do not leave  any items on the stairs.  Make sure that there are handrails on both sides of the stairs and use them. Fix handrails that are broken or loose. Make sure that handrails are as long as the stairways.  Check any carpeting to make sure that it is firmly attached to the stairs. Fix any carpet that is loose or worn.  Avoid having throw rugs at the top or bottom of the stairs. If you do have throw rugs, attach them to the floor with carpet tape.  Make sure that you have a light switch at the top of the stairs and the bottom of the stairs. If you do not have them, ask someone to add them for you. What else can I do to help prevent falls?  Wear shoes that:  Do not have high heels.  Have rubber bottoms.  Are comfortable and fit you well.  Are closed at the toe. Do not wear sandals.  If you use a stepladder:  Make sure that it is fully opened. Do not climb a closed stepladder.  Make sure that both sides of the stepladder are locked into place.  Ask someone to hold it for you,  if possible.  Clearly mark and make sure that you can see:  Any grab bars or handrails.  First and last steps.  Where the edge of each step is.  Use tools that help you move around (mobility aids) if they are needed. These include:  Canes.  Walkers.  Scooters.  Crutches.  Turn on the lights when you go into a dark area. Replace any light bulbs as soon as they burn out.  Set up your furniture so you have a clear path. Avoid moving your furniture around.  If any of your floors are uneven, fix them.  If there are any pets around you, be aware of where they are.  Review your medicines with your doctor. Some medicines can make you feel dizzy. This can increase your chance of falling. Ask your doctor what other things that you can do to help prevent falls. This information is not intended to replace advice given to you by your health care provider. Make sure you discuss any questions you have with your health care provider. Document Released: 12/05/2008 Document Revised: 07/17/2015 Document Reviewed: 03/15/2014 Elsevier Interactive Patient Education  2017 [employer].

## 2018-05-22 ENCOUNTER — Other Ambulatory Visit: Payer: Self-pay | Admitting: Nurse Practitioner

## 2018-05-22 MED ORDER — GABAPENTIN 300 MG PO CAPS: 300.0000 mg | ORAL_CAPSULE | Freq: Two times a day (BID) | ORAL | 0 refills | Status: DC

## 2018-05-30 ENCOUNTER — Other Ambulatory Visit: Payer: Self-pay | Admitting: *Deleted

## 2018-05-30 DIAGNOSIS — G8929 Other chronic pain: Secondary | ICD-10-CM

## 2018-05-30 DIAGNOSIS — M5440 Lumbago with sciatica, unspecified side: Principal | ICD-10-CM

## 2018-05-30 MED ORDER — HYDROCODONE-ACETAMINOPHEN 10-325 MG PO TABS: ORAL_TABLET | ORAL | 0 refills | Status: DC

## 2018-05-30 NOTE — Telephone Encounter (Signed)
Patient requested refill [place] Verified LR: 05/01/2018 Pended Rx and sent to [facility] for approval.

## 2018-06-18 ENCOUNTER — Encounter: Payer: Self-pay | Admitting: Nurse Practitioner

## 2018-06-29 ENCOUNTER — Other Ambulatory Visit: Payer: Self-pay | Admitting: *Deleted

## 2018-06-29 DIAGNOSIS — M5440 Lumbago with sciatica, unspecified side: Principal | ICD-10-CM

## 2018-06-29 DIAGNOSIS — G8929 Other chronic pain: Secondary | ICD-10-CM

## 2018-06-29 MED ORDER — HYDROCODONE-ACETAMINOPHEN 10-325 MG PO TABS: ORAL_TABLET | ORAL | 0 refills | Status: DC

## 2018-06-29 NOTE — Telephone Encounter (Signed)
Patient requested Refill [place] Verified LR: 05/31/2018 Pended Rx and sent to [facility] for approval.

## 2018-07-28 ENCOUNTER — Other Ambulatory Visit: Payer: Self-pay | Admitting: *Deleted

## 2018-07-28 DIAGNOSIS — G8929 Other chronic pain: Secondary | ICD-10-CM

## 2018-07-28 DIAGNOSIS — M5440 Lumbago with sciatica, unspecified side: Secondary | ICD-10-CM

## 2018-07-28 MED ORDER — HYDROCODONE-ACETAMINOPHEN 10-325 MG PO TABS: ORAL_TABLET | ORAL | 0 refills | Status: DC

## 2018-07-28 NOTE — Telephone Encounter (Signed)
Patient requested Refill [place] Verified LR: 06/29/2018 Pended Rx and sent to [facility] for approval.

## 2018-08-02 DIAGNOSIS — E1165 Type 2 diabetes mellitus with hyperglycemia: Secondary | ICD-10-CM | POA: Diagnosis not present

## 2018-08-02 DIAGNOSIS — H40013 Open angle with borderline findings, low risk, bilateral: Secondary | ICD-10-CM | POA: Diagnosis not present

## 2018-08-02 DIAGNOSIS — H353222 Exudative age-related macular degeneration, left eye, with inactive choroidal neovascularization: Secondary | ICD-10-CM | POA: Diagnosis not present

## 2018-08-02 DIAGNOSIS — H353112 Nonexudative age-related macular degeneration, right eye, intermediate dry stage: Secondary | ICD-10-CM | POA: Diagnosis not present

## 2018-08-12 ENCOUNTER — Other Ambulatory Visit: Payer: Self-pay | Admitting: Nurse Practitioner

## 2018-08-14 NOTE — Telephone Encounter (Signed)
Very High Risk warning populated when attempting to refill rx's. RX request sent to Lauree Chandler, NP for review and approval

## 2018-08-28 ENCOUNTER — Other Ambulatory Visit: Payer: Self-pay | Admitting: *Deleted

## 2018-08-28 DIAGNOSIS — G8929 Other chronic pain: Secondary | ICD-10-CM

## 2018-08-28 MED ORDER — HYDROCODONE-ACETAMINOPHEN 10-325 MG PO TABS: ORAL_TABLET | ORAL | 0 refills | Status: DC

## 2018-08-28 NOTE — Telephone Encounter (Signed)
Patient requested refill LR in Epic 07/28/2018  Pended Rx and sent to [facility] for approval.

## 2018-09-16 ENCOUNTER — Other Ambulatory Visit: Payer: Self-pay | Admitting: Nurse Practitioner

## 2018-09-18 NOTE — Telephone Encounter (Signed)
RX last filled in Epic on 05/22/2018, RX request forwarded to Lauree Chandler, NP to review [place] [facility] and approve if necessary

## 2018-09-27 ENCOUNTER — Other Ambulatory Visit: Payer: Self-pay | Admitting: *Deleted

## 2018-09-27 DIAGNOSIS — G8929 Other chronic pain: Secondary | ICD-10-CM

## 2018-09-27 MED ORDER — HYDROCODONE-ACETAMINOPHEN 10-325 MG PO TABS: ORAL_TABLET | ORAL | 0 refills | Status: DC

## 2018-09-27 NOTE — Telephone Encounter (Signed)
Patient requested refill Epic LR: 08/28/2018 Pended Rx and sent to [facility] for approval.  

## 2018-10-02 DIAGNOSIS — M79676 Pain in unspecified toe(s): Secondary | ICD-10-CM | POA: Diagnosis not present

## 2018-10-02 DIAGNOSIS — L84 Corns and callosities: Secondary | ICD-10-CM | POA: Diagnosis not present

## 2018-10-02 DIAGNOSIS — B351 Tinea unguium: Secondary | ICD-10-CM | POA: Diagnosis not present

## 2018-10-02 DIAGNOSIS — E1142 Type 2 diabetes mellitus with diabetic polyneuropathy: Secondary | ICD-10-CM | POA: Diagnosis not present

## 2018-10-12 ENCOUNTER — Other Ambulatory Visit: Payer: Self-pay | Admitting: Nurse Practitioner

## 2018-10-13 NOTE — Telephone Encounter (Signed)
High risk or very high risk warning populated when attempting to refill medication. RX request sent to PCP for review and approval if warranted.   

## 2018-10-16 ENCOUNTER — Encounter: Payer: Self-pay | Admitting: Family

## 2018-10-16 ENCOUNTER — Ambulatory Visit (INDEPENDENT_AMBULATORY_CARE_PROVIDER_SITE_OTHER): Payer: Medicare Other | Admitting: Family

## 2018-10-16 ENCOUNTER — Other Ambulatory Visit: Payer: Self-pay

## 2018-10-16 DIAGNOSIS — K219 Gastro-esophageal reflux disease without esophagitis: Secondary | ICD-10-CM | POA: Diagnosis not present

## 2018-10-16 MED ORDER — PANTOPRAZOLE SODIUM 40 MG PO TBEC: 40.0000 mg | DELAYED_RELEASE_TABLET | Freq: Every day | ORAL | 3 refills | Status: DC

## 2018-10-16 NOTE — Patient Instructions (Signed)
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
## 2018-10-26 ENCOUNTER — Other Ambulatory Visit: Payer: Self-pay | Admitting: Nurse Practitioner

## 2018-10-26 DIAGNOSIS — G8929 Other chronic pain: Secondary | ICD-10-CM

## 2018-10-26 DIAGNOSIS — F32A Depression, unspecified: Secondary | ICD-10-CM

## 2018-10-26 DIAGNOSIS — M5440 Lumbago with sciatica, unspecified side: Secondary | ICD-10-CM

## 2018-10-26 DIAGNOSIS — F329 Major depressive disorder, single episode, unspecified: Secondary | ICD-10-CM

## 2018-10-27 ENCOUNTER — Other Ambulatory Visit: Payer: Self-pay | Admitting: *Deleted

## 2018-10-27 DIAGNOSIS — G8929 Other chronic pain: Secondary | ICD-10-CM

## 2018-10-27 DIAGNOSIS — M5440 Lumbago with sciatica, unspecified side: Secondary | ICD-10-CM

## 2018-10-27 MED ORDER — HYDROCODONE-ACETAMINOPHEN 10-325 MG PO TABS: ORAL_TABLET | ORAL | 0 refills | Status: DC

## 2018-10-27 NOTE — Telephone Encounter (Signed)
Patient requested refill Epic LR: 09/27/2018 Pended Rx and sent to [facility] for approval.  

## 2018-11-13 ENCOUNTER — Ambulatory Visit (INDEPENDENT_AMBULATORY_CARE_PROVIDER_SITE_OTHER): Payer: Medicare Other | Admitting: Nurse Practitioner

## 2018-11-13 ENCOUNTER — Other Ambulatory Visit: Payer: Self-pay

## 2018-11-13 ENCOUNTER — Encounter: Payer: Self-pay | Admitting: Nurse Practitioner

## 2018-11-13 VITALS — BP 132/74 | HR 64 | Temp 97.7°F | Ht 63.0 in | Wt 175.6 lb

## 2018-11-13 DIAGNOSIS — E782 Mixed hyperlipidemia: Secondary | ICD-10-CM | POA: Diagnosis not present

## 2018-11-13 DIAGNOSIS — G8929 Other chronic pain: Secondary | ICD-10-CM | POA: Diagnosis not present

## 2018-11-13 DIAGNOSIS — I10 Essential (primary) hypertension: Secondary | ICD-10-CM | POA: Diagnosis not present

## 2018-11-13 DIAGNOSIS — F419 Anxiety disorder, unspecified: Secondary | ICD-10-CM | POA: Diagnosis not present

## 2018-11-13 DIAGNOSIS — M545 Low back pain, unspecified: Secondary | ICD-10-CM

## 2018-11-13 DIAGNOSIS — E1140 Type 2 diabetes mellitus with diabetic neuropathy, unspecified: Secondary | ICD-10-CM | POA: Diagnosis not present

## 2018-11-13 DIAGNOSIS — K219 Gastro-esophageal reflux disease without esophagitis: Secondary | ICD-10-CM

## 2018-11-13 DIAGNOSIS — D509 Iron deficiency anemia, unspecified: Secondary | ICD-10-CM | POA: Diagnosis not present

## 2018-11-13 DIAGNOSIS — M1990 Unspecified osteoarthritis, unspecified site: Secondary | ICD-10-CM | POA: Diagnosis not present

## 2018-11-13 DIAGNOSIS — E034 Atrophy of thyroid (acquired): Secondary | ICD-10-CM | POA: Diagnosis not present

## 2018-11-13 NOTE — Patient Instructions (Signed)
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
## 2018-11-14 LAB — CBC WITH DIFFERENTIAL/PLATELET
Absolute Monocytes: 1504 cells/uL — ABNORMAL HIGH (ref 200–950)
Basophils Absolute: 66 cells/uL (ref 0–200)
Basophils Relative: 0.7 %
Eosinophils Absolute: 442 cells/uL (ref 15–500)
Eosinophils Relative: 4.7 %
HCT: 37.5 % (ref 35.0–45.0)
Hemoglobin: 11.9 g/dL (ref 11.7–15.5)
Lymphs Abs: 3140 cells/uL (ref 850–3900)
MCH: 23.7 pg — ABNORMAL LOW (ref 27.0–33.0)
MCHC: 31.7 g/dL — ABNORMAL LOW (ref 32.0–36.0)
MCV: 74.7 fL — ABNORMAL LOW (ref 80.0–100.0)
MPV: 11 fL (ref 7.5–12.5)
Monocytes Relative: 16 %
Neutro Abs: 4249 cells/uL (ref 1500–7800)
Neutrophils Relative %: 45.2 %
Platelets: 302 10*3/uL (ref 140–400)
RBC: 5.02 10*6/uL (ref 3.80–5.10)
RDW: 14.5 % (ref 11.0–15.0)
Total Lymphocyte: 33.4 %
WBC: 9.4 10*3/uL (ref 3.8–10.8)

## 2018-11-14 LAB — COMPLETE METABOLIC PANEL WITH GFR
AG Ratio: 1.5 (calc) (ref 1.0–2.5)
ALT: 10 U/L (ref 6–29)
AST: 20 U/L (ref 10–35)
Albumin: 4 g/dL (ref 3.6–5.1)
Alkaline phosphatase (APISO): 74 U/L (ref 37–153)
BUN/Creatinine Ratio: 10 (calc) (ref 6–22)
BUN: 6 mg/dL — ABNORMAL LOW (ref 7–25)
CO2: 34 mmol/L — ABNORMAL HIGH (ref 20–32)
Calcium: 9.1 mg/dL (ref 8.6–10.4)
Chloride: 90 mmol/L — ABNORMAL LOW (ref 98–110)
Creat: 0.58 mg/dL — ABNORMAL LOW (ref 0.60–0.88)
GFR, Est African American: 99 mL/min/{1.73_m2} (ref >=60)
GFR, Est Non African American: 86 mL/min/{1.73_m2} (ref >=60)
Globulin: 2.7 g/dL (calc) (ref 1.9–3.7)
Glucose, Bld: 84 mg/dL (ref 65–99)
Potassium: 4.2 mmol/L (ref 3.5–5.3)
Sodium: 133 mmol/L — ABNORMAL LOW (ref 135–146)
Total Bilirubin: 0.5 mg/dL (ref 0.2–1.2)
Total Protein: 6.7 g/dL (ref 6.1–8.1)

## 2018-11-14 LAB — LIPID PANEL
Cholesterol: 213 mg/dL — ABNORMAL HIGH (ref <200)
HDL: 52 mg/dL (ref >=50)
LDL Cholesterol (Calc): 130 mg/dL (calc) — ABNORMAL HIGH
Non-HDL Cholesterol (Calc): 161 mg/dL (calc) — ABNORMAL HIGH (ref <130)
Total CHOL/HDL Ratio: 4.1 (calc) (ref <5.0)
Triglycerides: 177 mg/dL — ABNORMAL HIGH (ref <150)

## 2018-11-14 LAB — HEMOGLOBIN A1C
Hgb A1c MFr Bld: 5.7 % of total Hgb — ABNORMAL HIGH (ref <5.7)
Mean Plasma Glucose: 117 (calc)
eAG (mmol/L): 6.5 (calc)

## 2018-11-17 ENCOUNTER — Other Ambulatory Visit: Payer: Self-pay

## 2018-11-17 DIAGNOSIS — D509 Iron deficiency anemia, unspecified: Secondary | ICD-10-CM | POA: Insufficient documentation

## 2018-11-27 ENCOUNTER — Other Ambulatory Visit: Payer: Self-pay | Admitting: *Deleted

## 2018-11-27 DIAGNOSIS — G8929 Other chronic pain: Secondary | ICD-10-CM

## 2018-11-27 MED ORDER — HYDROCODONE-ACETAMINOPHEN 10-325 MG PO TABS: ORAL_TABLET | ORAL | 0 refills | Status: DC

## 2018-11-27 NOTE — Telephone Encounter (Signed)
Patient requested refill Epic LR 10/27/2018 Pended Rx and sent to [facility] for approval.

## 2018-12-12 ENCOUNTER — Other Ambulatory Visit: Payer: Self-pay | Admitting: Nurse Practitioner

## 2018-12-29 ENCOUNTER — Other Ambulatory Visit: Payer: Self-pay | Admitting: *Deleted

## 2018-12-29 DIAGNOSIS — M5440 Lumbago with sciatica, unspecified side: Secondary | ICD-10-CM

## 2018-12-29 DIAGNOSIS — G8929 Other chronic pain: Secondary | ICD-10-CM

## 2018-12-29 MED ORDER — HYDROCODONE-ACETAMINOPHEN 10-325 MG PO TABS: ORAL_TABLET | ORAL | 0 refills | Status: DC

## 2018-12-29 NOTE — Telephone Encounter (Signed)
Patient requested Refill Learned Verified LR: 11/28/2018 Pended Rx and sent to [facility] for approval.

## 2019-01-07 ENCOUNTER — Other Ambulatory Visit: Payer: Self-pay | Admitting: Family

## 2019-01-07 DIAGNOSIS — K219 Gastro-esophageal reflux disease without esophagitis: Secondary | ICD-10-CM

## 2019-01-08 DIAGNOSIS — E1142 Type 2 diabetes mellitus with diabetic polyneuropathy: Secondary | ICD-10-CM | POA: Diagnosis not present

## 2019-01-08 DIAGNOSIS — B351 Tinea unguium: Secondary | ICD-10-CM | POA: Diagnosis not present

## 2019-01-08 DIAGNOSIS — L84 Corns and callosities: Secondary | ICD-10-CM | POA: Diagnosis not present

## 2019-01-08 DIAGNOSIS — M79676 Pain in unspecified toe(s): Secondary | ICD-10-CM | POA: Diagnosis not present

## 2019-01-21 ENCOUNTER — Other Ambulatory Visit: Payer: Self-pay | Admitting: Nurse Practitioner

## 2019-01-22 NOTE — Telephone Encounter (Signed)
High risk or very high risk warning populated when attempting to refill medication. RX request sent to PCP for review and approval if warranted.   

## 2019-01-24 ENCOUNTER — Other Ambulatory Visit: Payer: Self-pay | Admitting: Nurse Practitioner

## 2019-01-26 ENCOUNTER — Other Ambulatory Visit: Payer: Self-pay | Admitting: *Deleted

## 2019-01-26 DIAGNOSIS — G8929 Other chronic pain: Secondary | ICD-10-CM

## 2019-01-26 MED ORDER — HYDROCODONE-ACETAMINOPHEN 10-325 MG PO TABS: ORAL_TABLET | ORAL | 0 refills | Status: DC

## 2019-01-26 NOTE — Telephone Encounter (Signed)
Patient requested refill Goodnight Verified LR: 12/29/2018 Pended Rx and sent to [facility] for approval.   Appointment scheduled for 12/28 to update narcotic contract.

## 2019-02-19 ENCOUNTER — Ambulatory Visit: Payer: Self-pay | Admitting: Nurse Practitioner

## 2019-02-21 ENCOUNTER — Telehealth: Payer: Self-pay
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
## 2019-02-26 ENCOUNTER — Other Ambulatory Visit: Payer: Self-pay

## 2019-02-26 ENCOUNTER — Ambulatory Visit: Payer: Medicare Other | Admitting: Nurse Practitioner

## 2019-02-26 DIAGNOSIS — G8929 Other chronic pain: Secondary | ICD-10-CM

## 2019-02-26 MED ORDER — HYDROCODONE-ACETAMINOPHEN 10-325 MG PO TABS: ORAL_TABLET | ORAL | 0 refills | Status: DC

## 2019-02-26 NOTE — Telephone Encounter (Signed)
Refill request for hydrocodone, last filled in epic on 01/26/2019.

## 2019-03-09 ENCOUNTER — Other Ambulatory Visit: Payer: Self-pay

## 2019-03-09 ENCOUNTER — Ambulatory Visit (INDEPENDENT_AMBULATORY_CARE_PROVIDER_SITE_OTHER): Payer: Medicare Other | Admitting: Nurse Practitioner

## 2019-03-09 ENCOUNTER — Encounter: Payer: Self-pay | Admitting: Nurse Practitioner

## 2019-03-09 VITALS — BP 112/64 | HR 68 | Temp 97.3°F | Ht 63.0 in | Wt 173.0 lb

## 2019-03-09 DIAGNOSIS — G8929 Other chronic pain: Secondary | ICD-10-CM

## 2019-03-09 DIAGNOSIS — E1140 Type 2 diabetes mellitus with diabetic neuropathy, unspecified: Secondary | ICD-10-CM

## 2019-03-09 DIAGNOSIS — E034 Atrophy of thyroid (acquired): Secondary | ICD-10-CM | POA: Diagnosis not present

## 2019-03-09 DIAGNOSIS — M545 Low back pain, unspecified: Secondary | ICD-10-CM

## 2019-03-09 DIAGNOSIS — D509 Iron deficiency anemia, unspecified: Secondary | ICD-10-CM | POA: Diagnosis not present

## 2019-03-09 DIAGNOSIS — I10 Essential (primary) hypertension: Secondary | ICD-10-CM | POA: Diagnosis not present

## 2019-03-09 DIAGNOSIS — F419 Anxiety disorder, unspecified: Secondary | ICD-10-CM | POA: Diagnosis not present

## 2019-03-09 DIAGNOSIS — E782 Mixed hyperlipidemia: Secondary | ICD-10-CM

## 2019-03-09 NOTE — Patient Instructions (Addendum)
  Would recommended trying to cut back on hydrocodone-apap if you can  Can take tylenol 325 1-2 tablets every 6 hours as needed throughout the day MAX dose of 3000 mg of tylenol in 24 hours.   CANCEL [facility]ARCH FOLLOW UP-- reschedule for 6 months from now.   Wellness visit will be over the phone
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
## 2019-03-10 LAB — LIPID PANEL
Cholesterol: 221 mg/dL — ABNORMAL HIGH (ref <200)
HDL: 46 mg/dL — ABNORMAL LOW (ref >=50)
LDL Cholesterol (Calc): 132 mg/dL (calc) — ABNORMAL HIGH
Non-HDL Cholesterol (Calc): 175 mg/dL (calc) — ABNORMAL HIGH (ref <130)
Total CHOL/HDL Ratio: 4.8 (calc) (ref <5.0)
Triglycerides: 304 mg/dL — ABNORMAL HIGH (ref <150)

## 2019-03-10 LAB — COMPLETE METABOLIC PANEL WITH GFR
AG Ratio: 1.4 (calc) (ref 1.0–2.5)
ALT: 9 U/L (ref 6–29)
AST: 16 U/L (ref 10–35)
Albumin: 4.1 g/dL (ref 3.6–5.1)
Alkaline phosphatase (APISO): 79 U/L (ref 37–153)
BUN: 9 mg/dL (ref 7–25)
CO2: 32 mmol/L (ref 20–32)
Calcium: 9.2 mg/dL (ref 8.6–10.4)
Chloride: 88 mmol/L — ABNORMAL LOW (ref 98–110)
Creat: 0.64 mg/dL (ref 0.60–0.88)
GFR, Est African American: 96 mL/min/{1.73_m2} (ref >=60)
GFR, Est Non African American: 83 mL/min/{1.73_m2} (ref >=60)
Globulin: 3 g/dL (calc) (ref 1.9–3.7)
Glucose, Bld: 79 mg/dL (ref 65–99)
Potassium: 4.1 mmol/L (ref 3.5–5.3)
Sodium: 129 mmol/L — ABNORMAL LOW (ref 135–146)
Total Bilirubin: 0.4 mg/dL (ref 0.2–1.2)
Total Protein: 7.1 g/dL (ref 6.1–8.1)

## 2019-03-10 LAB — CBC WITH DIFFERENTIAL/PLATELET
Absolute Monocytes: 1514 cells/uL — ABNORMAL HIGH (ref 200–950)
Basophils Absolute: 79 cells/uL (ref 0–200)
Basophils Relative: 0.7 %
Eosinophils Absolute: 712 cells/uL — ABNORMAL HIGH (ref 15–500)
Eosinophils Relative: 6.3 %
HCT: 46.4 % — ABNORMAL HIGH (ref 35.0–45.0)
Hemoglobin: 15 g/dL (ref 11.7–15.5)
Lymphs Abs: 3141 cells/uL (ref 850–3900)
MCH: 25.6 pg — ABNORMAL LOW (ref 27.0–33.0)
MCHC: 32.3 g/dL (ref 32.0–36.0)
MCV: 79.2 fL — ABNORMAL LOW (ref 80.0–100.0)
MPV: 10.6 fL (ref 7.5–12.5)
Monocytes Relative: 13.4 %
Neutro Abs: 5853 cells/uL (ref 1500–7800)
Neutrophils Relative %: 51.8 %
Platelets: 269 10*3/uL (ref 140–400)
RBC: 5.86 10*6/uL — ABNORMAL HIGH (ref 3.80–5.10)
RDW: 16.7 % — ABNORMAL HIGH (ref 11.0–15.0)
Total Lymphocyte: 27.8 %
WBC: 11.3 10*3/uL — ABNORMAL HIGH (ref 3.8–10.8)

## 2019-03-10 LAB — HEMOGLOBIN A1C
Hgb A1c MFr Bld: 5.6 % of total Hgb (ref <5.7)
Mean Plasma Glucose: 114 (calc)
eAG (mmol/L): 6.3 (calc)

## 2019-03-10 LAB — TSH: TSH: 1.53 mIU/L (ref 0.40–4.50)

## 2019-03-11 LAB — PAIN MGMT, PROFILE 1 W/O CONF, U
Amphetamines: NEGATIVE ng/mL
Barbiturates: NEGATIVE ng/mL
Benzodiazepines: NEGATIVE ng/mL
Cocaine Metabolite: NEGATIVE ng/mL
Creatinine: 14.5 mg/dL
Marijuana Metabolite: NEGATIVE ng/mL
Methadone Metabolite: NEGATIVE ng/mL
Opiates: POSITIVE ng/mL
Oxidant: NEGATIVE ug/mL
Oxycodone: NEGATIVE ng/mL
Phencyclidine: NEGATIVE ng/mL
Specific Gravity: 1.003 (ref >=1.0)
pH: 7 (ref 4.5–9.0)

## 2019-03-12 MED ORDER — UNABLE TO FIND: 0 refills | Status: AC

## 2019-03-13 ENCOUNTER — Other Ambulatory Visit: Payer: Self-pay

## 2019-03-13 ENCOUNTER — Other Ambulatory Visit: Payer: Self-pay | Admitting: Nurse Practitioner

## 2019-03-13 DIAGNOSIS — E782 Mixed hyperlipidemia: Secondary | ICD-10-CM

## 2019-03-13 MED ORDER — ATORVASTATIN CALCIUM 20 MG PO TABS: 20.0000 mg | ORAL_TABLET | Freq: Every day | ORAL | 1 refills | Status: DC

## 2019-03-19 ENCOUNTER — Other Ambulatory Visit: Payer: Self-pay | Admitting: *Deleted

## 2019-03-19 MED ORDER — GABAPENTIN 300 MG PO CAPS: ORAL_CAPSULE | ORAL | 1 refills | Status: DC

## 2019-03-19 NOTE — Telephone Encounter (Signed)
[place] Phoned to pharmacy

## 2019-03-28 ENCOUNTER — Other Ambulatory Visit: Payer: Self-pay | Admitting: *Deleted

## 2019-03-28 DIAGNOSIS — G8929 Other chronic pain: Secondary | ICD-10-CM

## 2019-03-28 DIAGNOSIS — M5440 Lumbago with sciatica, unspecified side: Secondary | ICD-10-CM

## 2019-03-28 DIAGNOSIS — H353222 Exudative age-related macular degeneration, left eye, with inactive choroidal neovascularization: Secondary | ICD-10-CM | POA: Diagnosis not present

## 2019-03-28 DIAGNOSIS — H40013 Open angle with borderline findings, low risk, bilateral: Secondary | ICD-10-CM | POA: Diagnosis not present

## 2019-03-28 DIAGNOSIS — H353112 Nonexudative age-related macular degeneration, right eye, intermediate dry stage: Secondary | ICD-10-CM | POA: Diagnosis not present

## 2019-03-28 MED ORDER — HYDROCODONE-ACETAMINOPHEN 10-325 MG PO TABS: ORAL_TABLET | ORAL | 0 refills | Status: DC

## 2019-03-28 NOTE — Telephone Encounter (Signed)
Last refill 02/25/2018

## 2019-04-06 ENCOUNTER — Other Ambulatory Visit: Payer: Self-pay

## 2019-04-07 ENCOUNTER — Other Ambulatory Visit: Payer: Self-pay | Admitting: Nurse Practitioner

## 2019-04-07 DIAGNOSIS — F32A Depression, unspecified: Secondary | ICD-10-CM

## 2019-04-07 DIAGNOSIS — G8929 Other chronic pain: Secondary | ICD-10-CM

## 2019-04-07 DIAGNOSIS — F329 Major depressive disorder, single episode, unspecified: Secondary | ICD-10-CM

## 2019-04-09 ENCOUNTER — Other Ambulatory Visit: Payer: Self-pay | Admitting: Nurse Practitioner

## 2019-04-09 NOTE — Telephone Encounter (Signed)
Received refill Request from pharmacy.  Pended Rx and sent to [facility] for approval due to [place].

## 2019-04-11 DIAGNOSIS — B351 Tinea unguium: Secondary | ICD-10-CM | POA: Diagnosis not present

## 2019-04-11 DIAGNOSIS — E1142 Type 2 diabetes mellitus with diabetic polyneuropathy: Secondary | ICD-10-CM | POA: Diagnosis not present

## 2019-04-11 DIAGNOSIS — L84 Corns and callosities: Secondary | ICD-10-CM | POA: Diagnosis not present

## 2019-04-11 DIAGNOSIS — M79676 Pain in unspecified toe(s): Secondary | ICD-10-CM | POA: Diagnosis not present

## 2019-04-13 ENCOUNTER — Other Ambulatory Visit: Payer: Self-pay

## 2019-04-13 ENCOUNTER — Other Ambulatory Visit: Payer: Self-pay | Admitting: Nurse Practitioner

## 2019-04-13 DIAGNOSIS — I10 Essential (primary) hypertension: Secondary | ICD-10-CM

## 2019-04-16 ENCOUNTER — Encounter (INDEPENDENT_AMBULATORY_CARE_PROVIDER_SITE_OTHER): Payer: Self-pay

## 2019-04-16 ENCOUNTER — Other Ambulatory Visit: Payer: Self-pay

## 2019-04-16 ENCOUNTER — Other Ambulatory Visit: Payer: Medicare Other

## 2019-04-16 DIAGNOSIS — I10 Essential (primary) hypertension: Secondary | ICD-10-CM

## 2019-04-16 LAB — BASIC METABOLIC PANEL WITH GFR
BUN/Creatinine Ratio: 17 (calc) (ref 6–22)
BUN: 10 mg/dL (ref 7–25)
CO2: 32 mmol/L (ref 20–32)
Calcium: 8.6 mg/dL (ref 8.6–10.4)
Chloride: 89 mmol/L — ABNORMAL LOW (ref 98–110)
Creat: 0.58 mg/dL — ABNORMAL LOW (ref 0.60–0.88)
GFR, Est African American: 99 mL/min/{1.73_m2} (ref >=60)
GFR, Est Non African American: 86 mL/min/{1.73_m2} (ref >=60)
Glucose, Bld: 79 mg/dL (ref 65–99)
Potassium: 3.9 mmol/L (ref 3.5–5.3)
Sodium: 129 mmol/L — ABNORMAL LOW (ref 135–146)

## 2019-04-17 ENCOUNTER — Other Ambulatory Visit: Payer: Self-pay | Admitting: Nurse Practitioner

## 2019-04-17 NOTE — Progress Notes (Signed)
Sodium levels on yesterdays labs remain unchanged- still low but stable just want to confirm she STOPPED  triamterene hctz Is she still taking her potassium supplement? Also wanted to follow up on how her blood pressure has been

## 2019-04-26 ENCOUNTER — Other Ambulatory Visit: Payer: Self-pay | Admitting: *Deleted

## 2019-04-26 DIAGNOSIS — G8929 Other chronic pain: Secondary | ICD-10-CM

## 2019-04-26 MED ORDER — HYDROCODONE-ACETAMINOPHEN 10-325 MG PO TABS: ORAL_TABLET | ORAL | 0 refills | Status: DC

## 2019-04-26 NOTE — Telephone Encounter (Signed)
Patient requested refill [place] Verified LR: 03/28/2019 Narcotic Contract updated Pended Rx and sent to [facility] for approval.

## 2019-04-27 ENCOUNTER — Encounter: Payer: Self-pay | Admitting: Nurse Practitioner

## 2019-04-27 ENCOUNTER — Other Ambulatory Visit: Payer: Self-pay

## 2019-04-27 ENCOUNTER — Telehealth (INDEPENDENT_AMBULATORY_CARE_PROVIDER_SITE_OTHER): Payer: Medicare Other | Admitting: Nurse Practitioner

## 2019-04-27 DIAGNOSIS — K219 Gastro-esophageal reflux disease without esophagitis: Secondary | ICD-10-CM | POA: Diagnosis not present

## 2019-04-27 DIAGNOSIS — I10 Essential (primary) hypertension: Secondary | ICD-10-CM | POA: Diagnosis not present

## 2019-04-27 DIAGNOSIS — R252 Cramp and spasm: Secondary | ICD-10-CM | POA: Diagnosis not present

## 2019-04-27 MED ORDER — TRIAMTERENE-HCTZ 37.5-25 MG PO TABS: 1.0000 | ORAL_TABLET | Freq: Every day | ORAL | 3 refills | Status: DC

## 2019-04-27 MED ORDER — POTASSIUM CHLORIDE ER 10 MEQ PO TBCR: 10.0000 meq | EXTENDED_RELEASE_TABLET | Freq: Two times a day (BID) | ORAL | 3 refills | Status: DC
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
## 2019-04-30 ENCOUNTER — Ambulatory Visit: Payer: Medicare Other | Admitting: Nurse Practitioner

## 2019-05-07 ENCOUNTER — Other Ambulatory Visit: Payer: Self-pay | Admitting: Nurse Practitioner

## 2019-05-07 ENCOUNTER — Other Ambulatory Visit: Payer: Self-pay | Admitting: *Deleted

## 2019-05-07 DIAGNOSIS — E782 Mixed hyperlipidemia: Secondary | ICD-10-CM

## 2019-05-07 DIAGNOSIS — G8929 Other chronic pain: Secondary | ICD-10-CM

## 2019-05-07 DIAGNOSIS — F32A Depression, unspecified: Secondary | ICD-10-CM

## 2019-05-07 DIAGNOSIS — I10 Essential (primary) hypertension: Secondary | ICD-10-CM

## 2019-05-07 DIAGNOSIS — F329 Major depressive disorder, single episode, unspecified: Secondary | ICD-10-CM

## 2019-05-07 MED ORDER — ATORVASTATIN CALCIUM 20 MG PO TABS: ORAL_TABLET | ORAL | 1 refills | Status: DC

## 2019-05-07 MED ORDER — TRIAMTERENE-HCTZ 37.5-25 MG PO TABS: 1.0000 | ORAL_TABLET | Freq: Every day | ORAL | 1 refills | Status: DC

## 2019-05-07 MED ORDER — DULOXETINE HCL 60 MG PO CPEP: ORAL_CAPSULE | ORAL | 1 refills | Status: DC

## 2019-05-07 MED ORDER — LEVOTHYROXINE SODIUM 125 MCG PO TABS: ORAL_TABLET | ORAL | 1 refills | Status: DC

## 2019-05-07 NOTE — Telephone Encounter (Signed)
Received fax refill Request from [place] and sent to [facility] for approval due to [place].

## 2019-05-10 ENCOUNTER — Encounter: Payer: Self-pay | Admitting: Nurse Practitioner

## 2019-05-10 ENCOUNTER — Telehealth: Payer: Self-pay

## 2019-05-10 ENCOUNTER — Other Ambulatory Visit: Payer: Self-pay

## 2019-05-10 ENCOUNTER — Ambulatory Visit (INDEPENDENT_AMBULATORY_CARE_PROVIDER_SITE_OTHER): Payer: Medicare Other | Admitting: Nurse Practitioner

## 2019-05-10 DIAGNOSIS — Z0000 Encounter for general adult medical examination without abnormal findings: Secondary | ICD-10-CM
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
## 2019-05-10 NOTE — Telephone Encounter (Signed)
Called patient to start appointment and no answer. Voicemail was left with office call back number.

## 2019-05-10 NOTE — Patient Instructions (Signed)
Abigail Edwards , Thank you for taking time to come for your Medicare Wellness Visit. I appreciate your ongoing commitment to your health goals. Please review the following plan we discussed and let me know if I can assist you in the future.   Screening recommendations/referrals: Colonoscopy aged out. Mammogram aged out Bone Density DUE Recommended yearly ophthalmology/optometry visit for glaucoma screening and checkup Recommended yearly dental visit for hygiene and checkup  Vaccinations: Influenza vaccine declines flu shot Pneumococcal vaccine up to date Tdap vaccine up to date.  Shingles vaccine recommended, to get at your local pharmacy-- to get [place]   Advanced directives: to bring to office at next visit. Will complete MOST form at next office visit   Conditions/risks identified: obesity, Diabetes, htn, hyperlipidemia, chronic pain medication    Next appointment: 1 year.    Preventive Care 83 Years and Older, Female Preventive care refers to lifestyle choices and visits with your health care provider that can promote health and wellness. What does preventive care include?  A yearly physical exam. This is also called an annual well check.  Dental exams once or twice a year.  Routine eye exams. Ask your health care provider how often you should have your eyes checked.  Personal lifestyle choices, including:  Daily care of your teeth and gums.  Regular physical activity.  Eating a healthy diet.  Avoiding tobacco and drug use.  Limiting alcohol use.  Practicing safe sex.  Taking low-dose aspirin every day.  Taking vitamin and mineral supplements as recommended by your health care provider. What happens during an annual well check? The services and screenings done by your health care provider during your annual well check will depend on your age, overall health, lifestyle risk factors, and family history of disease. Counseling  Your health care provider  may ask you questions about your:  Alcohol use.  Tobacco use.  Drug use.  Emotional well-being.  Home and relationship well-being.  Sexual activity.  Eating habits.  History of falls.  Memory and ability to understand (cognition).  Work and work Statistician.  Reproductive health. Screening  You may have the following tests or measurements:  Height, weight, and BMI.  Blood pressure.  Lipid and cholesterol levels. These may be checked every 5 years, or more frequently if you are over 74 years old.  Skin check.  Lung cancer screening. You may have this screening every year starting at age 32 if you have a 30-pack-year history of smoking and currently smoke or have quit within the past 15 years.  Fecal occult blood test (FOBT) of the stool. You may have this test every year starting at age 10.  Flexible sigmoidoscopy or colonoscopy. You may have a sigmoidoscopy every 5 years or a colonoscopy every 10 years starting at age 33.  Hepatitis C blood test.  Hepatitis B blood test.  Sexually transmitted disease (STD) testing.  Diabetes screening. This is done by checking your blood sugar (glucose) after you have not eaten for a while (fasting). You may have this done every 1-3 years.  Bone density scan. This is done to screen for osteoporosis. You may have this done starting at age 54.  Mammogram. This may be done every 1-2 years. Talk to your health care provider about how often you should have regular mammograms. Talk with your health care provider about your test results, treatment options, and if necessary, the need for more tests. Vaccines  Your health care provider may recommend certain vaccines, such  as:  Influenza vaccine. This is recommended every year.  Tetanus, diphtheria, and acellular pertussis (Tdap, Td) vaccine. You may need a Td booster every 10 years.  Zoster vaccine. You may need this after age 58.  Pneumococcal 13-valent conjugate (PCV13) vaccine.  One dose is recommended after age 48.  Pneumococcal polysaccharide (PPSV23) vaccine. One dose is recommended after age 52. Talk to your health care provider about which screenings and vaccines you need and how often you need them. This information is not intended to replace advice given to you by your health care provider. Make sure you discuss any questions you have with your health care provider. Document Released: 03/07/2015 Document Revised: 10/29/2015 Document Reviewed: 12/10/2014 Elsevier Interactive Patient Education  2017 [place] Prevention in the Home Falls can cause injuries. They can happen to people of all ages. There are many things you can do to make your home safe and to help prevent falls. What can I do on the outside of my home?  Regularly fix the edges of walkways and driveways and fix any cracks.  Remove anything that might make you trip as you walk through a door, such as a raised step or threshold.  Trim any bushes or trees on the path to your home.  Use bright outdoor lighting.  Clear any walking paths of anything that might make someone trip, such as rocks or tools.  Regularly check to see if handrails are loose or broken. Make sure that both sides of any steps have handrails.  Any raised decks and porches should have guardrails on the edges.  Have any leaves, snow, or ice cleared regularly.  Use sand or salt on walking paths during winter.  Clean up any spills in your garage right away. This includes oil or grease spills. What can I do in the bathroom?  Use night lights.  Install grab bars by the toilet and in the tub and shower. Do not use towel bars as grab bars.  Use non-skid mats or decals in the tub or shower.  If you need to sit down in the shower, use a plastic, non-slip stool.  Keep the floor dry. Clean up any water that spills on the floor as soon as it happens.  Remove soap buildup in the tub or shower regularly.  Attach bath  mats securely with double-sided non-slip rug tape.  Do not have throw rugs and other things on the floor that can make you trip. What can I do in the bedroom?  Use night lights.  Make sure that you have a light by your bed that is easy to reach.  Do not use any sheets or blankets that are too big for your bed. They should not hang down onto the floor.  Have a firm chair that has side arms. You can use this for support while you get dressed.  Do not have throw rugs and other things on the floor that can make you trip. What can I do in the kitchen?  Clean up any spills right away.  Avoid walking on wet floors.  Keep items that you use a lot in easy-to-reach places.  If you need to reach something above you, use a strong step stool that has a grab bar.  Keep electrical cords out of the way.  Do not use floor polish or wax that makes floors slippery. If you must use wax, use non-skid floor wax.  Do not have throw rugs and other things on the  floor that can make you trip. What can I do with my stairs?  Do not leave any items on the stairs.  Make sure that there are handrails on both sides of the stairs and use them. Fix handrails that are broken or loose. Make sure that handrails are as long as the stairways.  Check any carpeting to make sure that it is firmly attached to the stairs. Fix any carpet that is loose or worn.  Avoid having throw rugs at the top or bottom of the stairs. If you do have throw rugs, attach them to the floor with carpet tape.  Make sure that you have a light switch at the top of the stairs and the bottom of the stairs. If you do not have them, ask someone to add them for you. What else can I do to help prevent falls?  Wear shoes that:  Do not have high heels.  Have rubber bottoms.  Are comfortable and fit you well.  Are closed at the toe. Do not wear sandals.  If you use a stepladder:  Make sure that it is fully opened. Do not climb a closed  stepladder.  Make sure that both sides of the stepladder are locked into place.  Ask someone to hold it for you, if possible.  Clearly mark and make sure that you can see:  Any grab bars or handrails.  First and last steps.  Where the edge of each step is.  Use tools that help you move around (mobility aids) if they are needed. These include:  Canes.  Walkers.  Scooters.  Crutches.  Turn on the lights when you go into a dark area. Replace any light bulbs as soon as they burn out.  Set up your furniture so you have a clear path. Avoid moving your furniture around.  If any of your floors are uneven, fix them.  If there are any pets around you, be aware of where they are.  Review your medicines with your doctor. Some medicines can make you feel dizzy. This can increase your chance of falling. Ask your doctor what other things that you can do to help prevent falls. This information is not intended to replace advice given to you by your health care provider. Make sure you discuss any questions you have with your health care provider. Document Released: 12/05/2008 Document Revised: 07/17/2015 Document Reviewed: 03/15/2014 Elsevier Interactive Patient Education  2017 [employer].

## 2019-05-24 ENCOUNTER — Other Ambulatory Visit: Payer: Self-pay

## 2019-05-24 DIAGNOSIS — G8929 Other chronic pain: Secondary | ICD-10-CM

## 2019-05-24 DIAGNOSIS — M5440 Lumbago with sciatica, unspecified side: Secondary | ICD-10-CM

## 2019-05-24 MED ORDER — HYDROCODONE-ACETAMINOPHEN 10-325 MG PO TABS: ORAL_TABLET | ORAL | 0 refills | Status: DC

## 2019-05-24 NOTE — Telephone Encounter (Signed)
Opioid treatment agreement on file  RX last filled 04/26/2019

## 2019-05-28 ENCOUNTER — Other Ambulatory Visit: Payer: Self-pay

## 2019-05-28 DIAGNOSIS — K219 Gastro-esophageal reflux disease without esophagitis: Secondary | ICD-10-CM

## 2019-05-28 MED ORDER — PANTOPRAZOLE SODIUM 40 MG PO TBEC: DELAYED_RELEASE_TABLET | ORAL | 1 refills | Status: DC

## 2019-06-11 DIAGNOSIS — Z23 Encounter for immunization: Secondary | ICD-10-CM | POA: Diagnosis not present

## 2019-06-15 ENCOUNTER — Telehealth: Payer: Self-pay

## 2019-06-15 NOTE — Telephone Encounter (Signed)
Incoming call received from patient questioning why her RX's were sent to [facility], patient states she never heard of this pharmacy and questions who authorized rx's to be sent to this pharmacy.  I informed patient that [facility] is a preferred pharmacy on her insurance plan. Patient aware that if she no longer wishes to use them I can make a note.  Patient will discuss with her son and call back when decision is made.

## 2019-06-25 ENCOUNTER — Other Ambulatory Visit: Payer: Self-pay | Admitting: *Deleted

## 2019-06-25 DIAGNOSIS — G8929 Other chronic pain: Secondary | ICD-10-CM

## 2019-06-25 DIAGNOSIS — M5440 Lumbago with sciatica, unspecified side: Secondary | ICD-10-CM

## 2019-06-25 MED ORDER — HYDROCODONE-ACETAMINOPHEN 10-325 MG PO TABS: ORAL_TABLET | ORAL | 0 refills | Status: DC

## 2019-06-25 NOTE — Telephone Encounter (Signed)
Patient requested refill Abigail Edwards Verified LR: 05/27/19 Contract updated Pended Rx and sent to [facility] for approval.

## 2019-07-06 ENCOUNTER — Other Ambulatory Visit: Payer: Self-pay | Admitting: Nurse Practitioner

## 2019-07-07 DIAGNOSIS — Z23 Encounter for immunization: Secondary | ICD-10-CM | POA: Diagnosis not present

## 2019-07-26 ENCOUNTER — Other Ambulatory Visit: Payer: Self-pay

## 2019-07-26 DIAGNOSIS — G8929 Other chronic pain: Secondary | ICD-10-CM

## 2019-07-26 MED ORDER — HYDROCODONE-ACETAMINOPHEN 10-325 MG PO TABS: ORAL_TABLET | ORAL | 0 refills | Status: DC

## 2019-07-26 NOTE — Telephone Encounter (Signed)
Incoming call received from patient requesting refill for Hydrocodone.  RX last filled 06/25/2019  Opioid treatment agreement on file

## 2019-08-23 ENCOUNTER — Other Ambulatory Visit: Payer: Self-pay

## 2019-08-23 DIAGNOSIS — G8929 Other chronic pain: Secondary | ICD-10-CM

## 2019-08-23 NOTE — Telephone Encounter (Signed)
RX last filled in epic on 07/26/2019   Treatment agreement on file

## 2019-08-24 MED ORDER — HYDROCODONE-ACETAMINOPHEN 10-325 MG PO TABS: ORAL_TABLET | ORAL | 0 refills | Status: DC

## 2019-08-31 ENCOUNTER — Ambulatory Visit: Payer: Medicare Other | Admitting: Nurse Practitioner

## 2019-09-11 ENCOUNTER — Other Ambulatory Visit: Payer: Self-pay | Admitting: Nurse Practitioner

## 2019-09-11 NOTE — Telephone Encounter (Signed)
Rx last filled on 03/19/2019  Treatment agreement on file from 03/09/2019

## 2019-09-12 ENCOUNTER — Encounter: Payer: Self-pay | Admitting: Nurse Practitioner

## 2019-09-12 ENCOUNTER — Other Ambulatory Visit: Payer: Self-pay

## 2019-09-12 ENCOUNTER — Ambulatory Visit (INDEPENDENT_AMBULATORY_CARE_PROVIDER_SITE_OTHER): Payer: Medicare Other | Admitting: Nurse Practitioner

## 2019-09-12 VITALS — BP 138/72 | HR 66 | Temp 96.8°F | Ht 63.0 in | Wt 178.0 lb

## 2019-09-12 DIAGNOSIS — I10 Essential (primary) hypertension: Secondary | ICD-10-CM | POA: Diagnosis not present

## 2019-09-12 DIAGNOSIS — G8929 Other chronic pain: Secondary | ICD-10-CM

## 2019-09-12 DIAGNOSIS — M1990 Unspecified osteoarthritis, unspecified site: Secondary | ICD-10-CM | POA: Diagnosis not present

## 2019-09-12 DIAGNOSIS — D509 Iron deficiency anemia, unspecified: Secondary | ICD-10-CM

## 2019-09-12 DIAGNOSIS — F419 Anxiety disorder, unspecified: Secondary | ICD-10-CM

## 2019-09-12 DIAGNOSIS — K219 Gastro-esophageal reflux disease without esophagitis: Secondary | ICD-10-CM | POA: Diagnosis not present

## 2019-09-12 DIAGNOSIS — E1140 Type 2 diabetes mellitus with diabetic neuropathy, unspecified: Secondary | ICD-10-CM | POA: Diagnosis not present

## 2019-09-12 DIAGNOSIS — M545 Low back pain: Secondary | ICD-10-CM

## 2019-09-12 DIAGNOSIS — E782 Mixed hyperlipidemia: Secondary | ICD-10-CM

## 2019-09-12 MED ORDER — DICLOFENAC SODIUM 1 % EX GEL: 4.0000 g | Freq: Four times a day (QID) | CUTANEOUS | 3 refills | Status: DC

## 2019-09-12 NOTE — Patient Instructions (Signed)
Will schedule next follow up visit based on lab work
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
## 2019-09-13 LAB — COMPLETE METABOLIC PANEL WITH GFR
AG Ratio: 1.4 (calc) (ref 1.0–2.5)
ALT: 11 U/L (ref 6–29)
AST: 15 U/L (ref 10–35)
Albumin: 4.1 g/dL (ref 3.6–5.1)
Alkaline phosphatase (APISO): 86 U/L (ref 37–153)
BUN: 8 mg/dL (ref 7–25)
CO2: 34 mmol/L — ABNORMAL HIGH (ref 20–32)
Calcium: 9.1 mg/dL (ref 8.6–10.4)
Chloride: 89 mmol/L — ABNORMAL LOW (ref 98–110)
Creat: 0.66 mg/dL (ref 0.60–0.88)
GFR, Est African American: 95 mL/min/{1.73_m2} (ref >=60)
GFR, Est Non African American: 82 mL/min/{1.73_m2} (ref >=60)
Globulin: 2.9 g/dL (calc) (ref 1.9–3.7)
Glucose, Bld: 81 mg/dL (ref 65–99)
Potassium: 3.7 mmol/L (ref 3.5–5.3)
Sodium: 131 mmol/L — ABNORMAL LOW (ref 135–146)
Total Bilirubin: 0.6 mg/dL (ref 0.2–1.2)
Total Protein: 7 g/dL (ref 6.1–8.1)

## 2019-09-13 LAB — CBC WITH DIFFERENTIAL/PLATELET
Absolute Monocytes: 1576 cells/uL — ABNORMAL HIGH (ref 200–950)
Basophils Absolute: 72 cells/uL (ref 0–200)
Basophils Relative: 0.7 %
Eosinophils Absolute: 515 cells/uL — ABNORMAL HIGH (ref 15–500)
Eosinophils Relative: 5 %
HCT: 41.2 % (ref 35.0–45.0)
Hemoglobin: 13.5 g/dL (ref 11.7–15.5)
Lymphs Abs: 3080 cells/uL (ref 850–3900)
MCH: 26.1 pg — ABNORMAL LOW (ref 27.0–33.0)
MCHC: 32.8 g/dL (ref 32.0–36.0)
MCV: 79.5 fL — ABNORMAL LOW (ref 80.0–100.0)
MPV: 10.3 fL (ref 7.5–12.5)
Monocytes Relative: 15.3 %
Neutro Abs: 5057 cells/uL (ref 1500–7800)
Neutrophils Relative %: 49.1 %
Platelets: 270 10*3/uL (ref 140–400)
RBC: 5.18 10*6/uL — ABNORMAL HIGH (ref 3.80–5.10)
RDW: 14.3 % (ref 11.0–15.0)
Total Lymphocyte: 29.9 %
WBC: 10.3 10*3/uL (ref 3.8–10.8)

## 2019-09-13 LAB — HEMOGLOBIN A1C
Hgb A1c MFr Bld: 5.7 % of total Hgb — ABNORMAL HIGH (ref <5.7)
Mean Plasma Glucose: 117 (calc)
eAG (mmol/L): 6.5 (calc)

## 2019-09-13 LAB — LIPID PANEL
Cholesterol: 135 mg/dL (ref <200)
HDL: 46 mg/dL — ABNORMAL LOW (ref >=50)
LDL Cholesterol (Calc): 63 mg/dL (calc)
Non-HDL Cholesterol (Calc): 89 mg/dL (calc) (ref <130)
Total CHOL/HDL Ratio: 2.9 (calc) (ref <5.0)
Triglycerides: 188 mg/dL — ABNORMAL HIGH (ref <150)

## 2019-09-13 LAB — MICROALBUMIN, URINE: Microalb, Ur: <0.2 mg/dL

## 2019-09-24 ENCOUNTER — Other Ambulatory Visit: Payer: Self-pay | Admitting: Nurse Practitioner

## 2019-09-24 ENCOUNTER — Other Ambulatory Visit: Payer: Self-pay | Admitting: *Deleted

## 2019-09-24 DIAGNOSIS — G8929 Other chronic pain: Secondary | ICD-10-CM

## 2019-09-24 DIAGNOSIS — K219 Gastro-esophageal reflux disease without esophagitis: Secondary | ICD-10-CM

## 2019-09-24 DIAGNOSIS — M5440 Lumbago with sciatica, unspecified side: Secondary | ICD-10-CM

## 2019-09-24 MED ORDER — HYDROCODONE-ACETAMINOPHEN 10-325 MG PO TABS: ORAL_TABLET | ORAL | 0 refills | Status: DC

## 2019-09-24 NOTE — Telephone Encounter (Signed)
Patient requested refill Epic LR: 08/24/2019 Contract updated.  Pended Rx and sent to [facility] for approval.

## 2019-10-07 ENCOUNTER — Other Ambulatory Visit: Payer: Self-pay | Admitting: Nurse Practitioner

## 2019-10-07 DIAGNOSIS — F32A Depression, unspecified: Secondary | ICD-10-CM

## 2019-10-07 DIAGNOSIS — F329 Major depressive disorder, single episode, unspecified: Secondary | ICD-10-CM

## 2019-10-07 DIAGNOSIS — G8929 Other chronic pain: Secondary | ICD-10-CM

## 2019-10-07 DIAGNOSIS — I10 Essential (primary) hypertension: Secondary | ICD-10-CM

## 2019-10-07 DIAGNOSIS — M5440 Lumbago with sciatica, unspecified side: Secondary | ICD-10-CM

## 2019-10-08 NOTE — Telephone Encounter (Signed)
Pharmacy requested refill. Pended Rx and sent to [facility] for approval due to HIGH ALERT Warning.  

## 2019-10-16 ENCOUNTER — Other Ambulatory Visit: Payer: Self-pay | Admitting: Nurse Practitioner

## 2019-10-16 DIAGNOSIS — E782 Mixed hyperlipidemia: Secondary | ICD-10-CM

## 2019-10-23 ENCOUNTER — Other Ambulatory Visit: Payer: Self-pay

## 2019-10-23 DIAGNOSIS — M5440 Lumbago with sciatica, unspecified side: Secondary | ICD-10-CM

## 2019-10-23 DIAGNOSIS — G8929 Other chronic pain: Secondary | ICD-10-CM

## 2019-10-23 MED ORDER — HYDROCODONE-ACETAMINOPHEN 10-325 MG PO TABS: ORAL_TABLET | ORAL | 0 refills | Status: DC

## 2019-10-23 NOTE — Telephone Encounter (Signed)
Rx last filled 10/12/2019, patient states this is the 30 th day   Treatment agreement on file from January 2021

## 2019-11-22 ENCOUNTER — Other Ambulatory Visit: Payer: Self-pay

## 2019-11-22 DIAGNOSIS — G8929 Other chronic pain: Secondary | ICD-10-CM

## 2019-11-22 MED ORDER — HYDROCODONE-ACETAMINOPHEN 10-325 MG PO TABS: ORAL_TABLET | ORAL | 0 refills | Status: DC

## 2019-11-22 NOTE — Telephone Encounter (Signed)
Treatment agreement on file from 03/09/2019   RX last filled 10/23/2019

## 2019-12-20 ENCOUNTER — Other Ambulatory Visit: Payer: Self-pay | Admitting: Nurse Practitioner

## 2019-12-20 DIAGNOSIS — M1990 Unspecified osteoarthritis, unspecified site: Secondary | ICD-10-CM

## 2019-12-21 ENCOUNTER — Other Ambulatory Visit: Payer: Self-pay

## 2019-12-21 DIAGNOSIS — G8929 Other chronic pain: Secondary | ICD-10-CM

## 2019-12-21 DIAGNOSIS — M5440 Lumbago with sciatica, unspecified side: Secondary | ICD-10-CM

## 2019-12-21 MED ORDER — HYDROCODONE-ACETAMINOPHEN 10-325 MG PO TABS: ORAL_TABLET | ORAL | 0 refills | Status: DC

## 2019-12-21 NOTE — Telephone Encounter (Signed)
Treatment agreement on file from 03/09/2019  RX last filled on 11/22/2019

## 2020-01-02 ENCOUNTER — Telehealth: Payer: Self-pay

## 2020-01-02 NOTE — Telephone Encounter (Signed)
Requesting nebulizer for COPD diagnosis but she does not have this diagnosis on chart and not prescribed these medications.

## 2020-01-02 NOTE — Telephone Encounter (Signed)
Cornelius Moras from [facility] called and states that she will fax over paper for patient to have Nebulizer. Paper placed in Review & Sign folder. Please Advise.

## 2020-01-03 NOTE — Telephone Encounter (Signed)
Explained Jessica's message to [place] [facility] with [employer]. Agreed.

## 2020-01-03 NOTE — Telephone Encounter (Signed)
Abigail Edwards with [employer] called and left message on Voicemail to return call regarding orders for patient.   Called and LMOM to return call.

## 2020-01-22 ENCOUNTER — Other Ambulatory Visit: Payer: Self-pay

## 2020-01-22 DIAGNOSIS — G8929 Other chronic pain: Secondary | ICD-10-CM

## 2020-01-22 DIAGNOSIS — M5440 Lumbago with sciatica, unspecified side: Secondary | ICD-10-CM

## 2020-01-22 MED ORDER — HYDROCODONE-ACETAMINOPHEN 10-325 MG PO TABS: ORAL_TABLET | ORAL | 0 refills | Status: DC

## 2020-01-22 NOTE — Telephone Encounter (Signed)
Incoming call received from patient requesting a refill on Hydrocodone.   RX last filled on 10/29/201  Treatment agreement on file from 03/09/2019

## 2020-01-26 DIAGNOSIS — Z23 Encounter for immunization: Secondary | ICD-10-CM | POA: Diagnosis not present

## 2020-02-03 ENCOUNTER — Other Ambulatory Visit: Payer: Self-pay | Admitting: Nurse Practitioner

## 2020-02-03 DIAGNOSIS — K219 Gastro-esophageal reflux disease without esophagitis: Secondary | ICD-10-CM

## 2020-02-10 ENCOUNTER — Other Ambulatory Visit: Payer: Self-pay | Admitting: Nurse Practitioner

## 2020-02-10 DIAGNOSIS — M1990 Unspecified osteoarthritis, unspecified site: Secondary | ICD-10-CM

## 2020-02-21 ENCOUNTER — Encounter: Payer: Self-pay | Admitting: Nurse Practitioner

## 2020-02-21 ENCOUNTER — Other Ambulatory Visit: Payer: Self-pay | Admitting: *Deleted

## 2020-02-21 DIAGNOSIS — G8929 Other chronic pain: Secondary | ICD-10-CM

## 2020-02-21 DIAGNOSIS — M5441 Lumbago with sciatica, right side: Secondary | ICD-10-CM

## 2020-02-21 DIAGNOSIS — M5440 Lumbago with sciatica, unspecified side: Secondary | ICD-10-CM

## 2020-02-21 MED ORDER — HYDROCODONE-ACETAMINOPHEN 10-325 MG PO TABS: ORAL_TABLET | ORAL | 0 refills | Status: DC

## 2020-02-21 NOTE — Telephone Encounter (Signed)
Patient requested refill Epic LR: 01/22/2020 Contract on [facility] Rx and sent to [place] for approval.

## 2020-03-03 ENCOUNTER — Encounter: Payer: Self-pay | Admitting: Nurse Practitioner

## 2020-03-03 ENCOUNTER — Other Ambulatory Visit: Payer: Self-pay

## 2020-03-03 ENCOUNTER — Ambulatory Visit (INDEPENDENT_AMBULATORY_CARE_PROVIDER_SITE_OTHER): Payer: Medicare Other | Admitting: Nurse Practitioner

## 2020-03-03 VITALS — BP 132/74 | HR 80 | Temp 97.9°F | Ht 63.0 in | Wt 173.4 lb

## 2020-03-03 DIAGNOSIS — M545 Low back pain, unspecified: Secondary | ICD-10-CM

## 2020-03-03 DIAGNOSIS — I10 Essential (primary) hypertension: Secondary | ICD-10-CM

## 2020-03-03 DIAGNOSIS — G8929 Other chronic pain: Secondary | ICD-10-CM

## 2020-03-03 DIAGNOSIS — E1140 Type 2 diabetes mellitus with diabetic neuropathy, unspecified: Secondary | ICD-10-CM | POA: Diagnosis not present

## 2020-03-03 DIAGNOSIS — M1990 Unspecified osteoarthritis, unspecified site: Secondary | ICD-10-CM

## 2020-03-03 DIAGNOSIS — D509 Iron deficiency anemia, unspecified: Secondary | ICD-10-CM

## 2020-03-03 DIAGNOSIS — F419 Anxiety disorder, unspecified: Secondary | ICD-10-CM

## 2020-03-03 DIAGNOSIS — E782 Mixed hyperlipidemia: Secondary | ICD-10-CM

## 2020-03-03 DIAGNOSIS — E034 Atrophy of thyroid (acquired): Secondary | ICD-10-CM

## 2020-03-03 DIAGNOSIS — K219 Gastro-esophageal reflux disease without esophagitis: Secondary | ICD-10-CM

## 2020-03-03 DIAGNOSIS — K089 Disorder of teeth and supporting structures, unspecified: Secondary | ICD-10-CM

## 2020-03-03 NOTE — Patient Instructions (Signed)
To request records from Dr Rosalita Chessman ([facility]) ophthalmology.
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
## 2020-03-04 LAB — COMPLETE METABOLIC PANEL WITH GFR
AG Ratio: 1.6 (calc) (ref 1.0–2.5)
ALT: 9 U/L (ref 6–29)
AST: 18 U/L (ref 10–35)
Albumin: 4.1 g/dL (ref 3.6–5.1)
Alkaline phosphatase (APISO): 81 U/L (ref 37–153)
BUN/Creatinine Ratio: 8 (calc) (ref 6–22)
BUN: 6 mg/dL — ABNORMAL LOW (ref 7–25)
CO2: 35 mmol/L — ABNORMAL HIGH (ref 20–32)
Calcium: 9.4 mg/dL (ref 8.6–10.4)
Chloride: 91 mmol/L — ABNORMAL LOW (ref 98–110)
Creat: 0.71 mg/dL (ref 0.60–0.88)
GFR, Est African American: 91 mL/min/{1.73_m2} (ref >=60)
GFR, Est Non African American: 79 mL/min/{1.73_m2} (ref >=60)
Globulin: 2.6 g/dL (calc) (ref 1.9–3.7)
Glucose, Bld: 83 mg/dL (ref 65–99)
Potassium: 3.7 mmol/L (ref 3.5–5.3)
Sodium: 133 mmol/L — ABNORMAL LOW (ref 135–146)
Total Bilirubin: 0.6 mg/dL (ref 0.2–1.2)
Total Protein: 6.7 g/dL (ref 6.1–8.1)

## 2020-03-04 LAB — CBC WITH DIFFERENTIAL/PLATELET
Absolute Monocytes: 1659 cells/uL — ABNORMAL HIGH (ref 200–950)
Basophils Absolute: 75 cells/uL (ref 0–200)
Basophils Relative: 0.7 %
Eosinophils Absolute: 417 cells/uL (ref 15–500)
Eosinophils Relative: 3.9 %
HCT: 41.1 % (ref 35.0–45.0)
Hemoglobin: 13.5 g/dL (ref 11.7–15.5)
Lymphs Abs: 2771 cells/uL (ref 850–3900)
MCH: 25.8 pg — ABNORMAL LOW (ref 27.0–33.0)
MCHC: 32.8 g/dL (ref 32.0–36.0)
MCV: 78.6 fL — ABNORMAL LOW (ref 80.0–100.0)
MPV: 11 fL (ref 7.5–12.5)
Monocytes Relative: 15.5 %
Neutro Abs: 5778 cells/uL (ref 1500–7800)
Neutrophils Relative %: 54 %
Platelets: 273 10*3/uL (ref 140–400)
RBC: 5.23 10*6/uL — ABNORMAL HIGH (ref 3.80–5.10)
RDW: 14.6 % (ref 11.0–15.0)
Total Lymphocyte: 25.9 %
WBC: 10.7 10*3/uL (ref 3.8–10.8)

## 2020-03-04 LAB — LIPID PANEL
Cholesterol: 125 mg/dL (ref <200)
HDL: 44 mg/dL — ABNORMAL LOW (ref >=50)
LDL Cholesterol (Calc): 57 mg/dL (calc)
Non-HDL Cholesterol (Calc): 81 mg/dL (calc) (ref <130)
Total CHOL/HDL Ratio: 2.8 (calc) (ref <5.0)
Triglycerides: 154 mg/dL — ABNORMAL HIGH (ref <150)

## 2020-03-04 LAB — TSH: TSH: 1.17 mIU/L (ref 0.40–4.50)

## 2020-03-04 LAB — HEMOGLOBIN A1C
Hgb A1c MFr Bld: 5.7 % of total Hgb — ABNORMAL HIGH (ref <5.7)
Mean Plasma Glucose: 117 mg/dL
eAG (mmol/L): 6.5 mmol/L

## 2020-03-10 ENCOUNTER — Other Ambulatory Visit: Payer: Self-pay | Admitting: Nurse Practitioner

## 2020-03-11 MED ORDER — GABAPENTIN 300 MG PO CAPS
300.0000 mg | ORAL_CAPSULE | Freq: Two times a day (BID) | ORAL | 1 refills | Status: DC
Start: 2020-03-11 — End: 2020-03-12

## 2020-03-11 NOTE — Addendum Note (Signed)
Addended by: Rafael Bihari A on: 03/11/2020 10:23 AM   Modules accepted: Orders

## 2020-03-11 NOTE — Telephone Encounter (Signed)
Pharmacy requested refill Pended Rx and sent to [facility] for approval due to alert.

## 2020-03-12 MED ORDER — GABAPENTIN 300 MG PO CAPS
300.0000 mg | ORAL_CAPSULE | Freq: Two times a day (BID) | ORAL | 1 refills | Status: DC
Start: 2020-03-12 — End: 2020-08-29

## 2020-03-12 NOTE — Addendum Note (Signed)
Addended by: Rafael Bihari A on: 03/12/2020 11:09 AM   Modules accepted: Orders

## 2020-03-12 NOTE — Telephone Encounter (Signed)
Medication repended due to Status set to Print.  Pended and sent to [facility] for approval.

## 2020-03-12 NOTE — Addendum Note (Signed)
Addended by: Lauree Chandler on: 03/12/2020 11:38 AM   Modules accepted: Orders

## 2020-03-20 ENCOUNTER — Other Ambulatory Visit: Payer: Self-pay | Admitting: *Deleted

## 2020-03-20 DIAGNOSIS — G8929 Other chronic pain: Secondary | ICD-10-CM

## 2020-03-20 DIAGNOSIS — M5440 Lumbago with sciatica, unspecified side: Secondary | ICD-10-CM

## 2020-03-20 NOTE — Telephone Encounter (Signed)
Patient requested refill Epic LR: 02/21/2020 Contract on [employer] Rx and sent to [facility] for approval. (will hold and send on Friday).

## 2020-03-21 ENCOUNTER — Other Ambulatory Visit: Payer: Self-pay | Admitting: Nurse Practitioner

## 2020-03-21 DIAGNOSIS — I10 Essential (primary) hypertension: Secondary | ICD-10-CM

## 2020-03-21 DIAGNOSIS — E782 Mixed hyperlipidemia: Secondary | ICD-10-CM

## 2020-03-21 MED ORDER — HYDROCODONE-ACETAMINOPHEN 10-325 MG PO TABS: ORAL_TABLET | ORAL | 0 refills | Status: DC

## 2020-04-17 ENCOUNTER — Other Ambulatory Visit: Payer: Self-pay | Admitting: Nurse Practitioner

## 2020-04-17 ENCOUNTER — Other Ambulatory Visit: Payer: Self-pay

## 2020-04-17 DIAGNOSIS — R252 Cramp and spasm: Secondary | ICD-10-CM

## 2020-04-17 DIAGNOSIS — G8929 Other chronic pain: Secondary | ICD-10-CM

## 2020-04-17 DIAGNOSIS — M5440 Lumbago with sciatica, unspecified side: Secondary | ICD-10-CM

## 2020-04-17 DIAGNOSIS — I10 Essential (primary) hypertension: Secondary | ICD-10-CM

## 2020-04-17 NOTE — Telephone Encounter (Signed)
Very high warning came up when trying to refill medication. Routed to Sherrie Mustache, NP

## 2020-04-17 NOTE — Telephone Encounter (Signed)
Treatment agreement on file from 02/2020  RX last filled 03/21/2020

## 2020-04-17 NOTE — Telephone Encounter (Signed)
Last refill in database was 03/22/20, will wait until due to refill.

## 2020-04-18 NOTE — Telephone Encounter (Signed)
Patient states she will need this refill on Monday

## 2020-04-21 MED ORDER — HYDROCODONE-ACETAMINOPHEN 10-325 MG PO TABS
ORAL_TABLET | ORAL | 0 refills | Status: DC
Start: 2020-04-21 — End: 2020-05-20

## 2020-04-25 ENCOUNTER — Other Ambulatory Visit: Payer: Self-pay | Admitting: Nurse Practitioner

## 2020-04-25 DIAGNOSIS — M1990 Unspecified osteoarthritis, unspecified site: Secondary | ICD-10-CM

## 2020-04-28 ENCOUNTER — Telehealth: Payer: Self-pay | Admitting: Nurse Practitioner

## 2020-04-28 NOTE — Telephone Encounter (Signed)
Left message to schedule AWV 

## 2020-04-28 NOTE — Telephone Encounter (Signed)
Patient returned call, scheduled appointment for Annual wellness visit on 05/14/2020

## 2020-04-29 ENCOUNTER — Other Ambulatory Visit: Payer: Self-pay | Admitting: Nurse Practitioner

## 2020-04-29 DIAGNOSIS — F32A Depression, unspecified: Secondary | ICD-10-CM

## 2020-04-29 DIAGNOSIS — G8929 Other chronic pain: Secondary | ICD-10-CM

## 2020-05-07 NOTE — Telephone Encounter (Signed)
Opened in error

## 2020-05-14 ENCOUNTER — Ambulatory Visit (INDEPENDENT_AMBULATORY_CARE_PROVIDER_SITE_OTHER): Payer: Medicare Other | Admitting: Nurse Practitioner

## 2020-05-14 ENCOUNTER — Telehealth: Payer: Self-pay

## 2020-05-14 ENCOUNTER — Other Ambulatory Visit: Payer: Self-pay

## 2020-05-14 DIAGNOSIS — Z0000 Encounter for general adult medical examination without abnormal findings: Secondary | ICD-10-CM | POA: Diagnosis not present

## 2020-05-14 NOTE — Patient Instructions (Addendum)
Abigail Edwards , Thank you for taking time to come for your Medicare Wellness Visit. I appreciate your ongoing commitment to your health goals. Please review the following plan we discussed and let me know if I can assist you in the future.   Screening recommendations/referrals: Colonoscopy aged out Mammogram aged out Bone Density declined Recommended yearly ophthalmology/optometry visit for glaucoma screening and checkup Recommended yearly dental visit for hygiene and checkup  Vaccinations: Influenza vaccine declined Pneumococcal vaccine up to date Tdap vaccine up to date Shingles vaccine RECOMMENDED- to get at your local pharmacy    Advanced directives: on file.   Conditions/risks identified: advanced age, vision loss, fall risk  Next appointment: 1 year for AWV   Preventive Care 7 Years and Older, Female Preventive care refers to lifestyle choices and visits with your health care provider that can promote health and wellness. What does preventive care include?  A yearly physical exam. This is also called an annual well check.  Dental exams once or twice a year.  Routine eye exams. Ask your health care provider how often you should have your eyes checked.  Personal lifestyle choices, including:  Daily care of your teeth and gums.  Regular physical activity.  Eating a healthy diet.  Avoiding tobacco and drug use.  Limiting alcohol use.  Practicing safe sex.  Taking low-dose aspirin every day.  Taking vitamin and mineral supplements as recommended by your health care provider. What happens during an annual well check? The services and screenings done by your health care provider during your annual well check will depend on your age, overall health, lifestyle risk factors, and family history of disease. Counseling  Your health care provider may ask you questions about your:  Alcohol use.  Tobacco use.  Drug use.  Emotional well-being.  Home and relationship  well-being.  Sexual activity.  Eating habits.  History of falls.  Memory and ability to understand (cognition).  Work and work Statistician.  Reproductive health. Screening  You may have the following tests or measurements:  Height, weight, and BMI.  Blood pressure.  Lipid and cholesterol levels. These may be checked every 5 years, or more frequently if you are over 57 years old.  Skin check.  Lung cancer screening. You may have this screening every year starting at age 64 if you have a 30-pack-year history of smoking and currently smoke or have quit within the past 15 years.  Fecal occult blood test (FOBT) of the stool. You may have this test every year starting at age 29.  Flexible sigmoidoscopy or colonoscopy. You may have a sigmoidoscopy every 5 years or a colonoscopy every 10 years starting at age 48.  Hepatitis C blood test.  Hepatitis B blood test.  Sexually transmitted disease (STD) testing.  Diabetes screening. This is done by checking your blood sugar (glucose) after you have not eaten for a while (fasting). You may have this done every 1-3 years.  Bone density scan. This is done to screen for osteoporosis. You may have this done starting at age 59.  Mammogram. This may be done every 1-2 years. Talk to your health care provider about how often you should have regular mammograms. Talk with your health care provider about your test results, treatment options, and if necessary, the need for more tests. Vaccines  Your health care provider may recommend certain vaccines, such as:  Influenza vaccine. This is recommended every year.  Tetanus, diphtheria, and acellular pertussis (Tdap, Td) vaccine. You may need a  Td booster every 10 years.  Zoster vaccine. You may need this after age 86.  Pneumococcal 13-valent conjugate (PCV13) vaccine. One dose is recommended after age 70.  Pneumococcal polysaccharide (PPSV23) vaccine. One dose is recommended after age  69. Talk to your health care provider about which screenings and vaccines you need and how often you need them. This information is not intended to replace advice given to you by your health care provider. Make sure you discuss any questions you have with your health care provider. Document Released: 03/07/2015 Document Revised: 10/29/2015 Document Reviewed: 12/10/2014 Elsevier Interactive Patient Education  2017 [place] Prevention in the Home Falls can cause injuries. They can happen to people of all ages. There are many things you can do to make your home safe and to help prevent falls. What can I do on the outside of my home?  Regularly fix the edges of walkways and driveways and fix any cracks.  Remove anything that might make you trip as you walk through a door, such as a raised step or threshold.  Trim any bushes or trees on the path to your home.  Use bright outdoor lighting.  Clear any walking paths of anything that might make someone trip, such as rocks or tools.  Regularly check to see if handrails are loose or broken. Make sure that both sides of any steps have handrails.  Any raised decks and porches should have guardrails on the edges.  Have any leaves, snow, or ice cleared regularly.  Use sand or salt on walking paths during winter.  Clean up any spills in your garage right away. This includes oil or grease spills. What can I do in the bathroom?  Use night lights.  Install grab bars by the toilet and in the tub and shower. Do not use towel bars as grab bars.  Use non-skid mats or decals in the tub or shower.  If you need to sit down in the shower, use a plastic, non-slip stool.  Keep the floor dry. Clean up any water that spills on the floor as soon as it happens.  Remove soap buildup in the tub or shower regularly.  Attach bath mats securely with double-sided non-slip rug tape.  Do not have throw rugs and other things on the floor that can make  you trip. What can I do in the bedroom?  Use night lights.  Make sure that you have a light by your bed that is easy to reach.  Do not use any sheets or blankets that are too big for your bed. They should not hang down onto the floor.  Have a firm chair that has side arms. You can use this for support while you get dressed.  Do not have throw rugs and other things on the floor that can make you trip. What can I do in the kitchen?  Clean up any spills right away.  Avoid walking on wet floors.  Keep items that you use a lot in easy-to-reach places.  If you need to reach something above you, use a strong step stool that has a grab bar.  Keep electrical cords out of the way.  Do not use floor polish or wax that makes floors slippery. If you must use wax, use non-skid floor wax.  Do not have throw rugs and other things on the floor that can make you trip. What can I do with my stairs?  Do not leave any items on the stairs.  Make sure that there are handrails on both sides of the stairs and use them. Fix handrails that are broken or loose. Make sure that handrails are as long as the stairways.  Check any carpeting to make sure that it is firmly attached to the stairs. Fix any carpet that is loose or worn.  Avoid having throw rugs at the top or bottom of the stairs. If you do have throw rugs, attach them to the floor with carpet tape.  Make sure that you have a light switch at the top of the stairs and the bottom of the stairs. If you do not have them, ask someone to add them for you. What else can I do to help prevent falls?  Wear shoes that:  Do not have high heels.  Have rubber bottoms.  Are comfortable and fit you well.  Are closed at the toe. Do not wear sandals.  If you use a stepladder:  Make sure that it is fully opened. Do not climb a closed stepladder.  Make sure that both sides of the stepladder are locked into place.  Ask someone to hold it for you, if  possible.  Clearly mark and make sure that you can see:  Any grab bars or handrails.  First and last steps.  Where the edge of each step is.  Use tools that help you move around (mobility aids) if they are needed. These include:  Canes.  Walkers.  Scooters.  Crutches.  Turn on the lights when you go into a dark area. Replace any light bulbs as soon as they burn out.  Set up your furniture so you have a clear path. Avoid moving your furniture around.  If any of your floors are uneven, fix them.  If there are any pets around you, be aware of where they are.  Review your medicines with your doctor. Some medicines can make you feel dizzy. This can increase your chance of falling. Ask your doctor what other things that you can do to help prevent falls. This information is not intended to replace advice given to you by your health care provider. Make sure you discuss any questions you have with your health care provider. Document Released: 12/05/2008 Document Revised: 07/17/2015 Document Reviewed: 03/15/2014 Elsevier Interactive Patient Education  2017 [employer].
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
## 2020-05-14 NOTE — Progress Notes (Signed)
This service is provided via telemedicine  No vital signs collected/recorded due to the encounter was a telemedicine visit.   Location of patient (ex: home, work): Home  Patient consents to a telephone visit:  Yes, see encounter dated 05/14/2020  Location of the provider (ex: office, home):  [facility]  Name of any referring provider:  N/A  Names of all persons participating in the telemedicine service and their role in the encounter:  Sherrie Mustache, Nurse Practitioner, Carroll Kinds, CMA, and patient.   Time spent on call:  11 minutes with medical assistant

## 2020-05-14 NOTE — Telephone Encounter (Signed)
[name], loughmiller are scheduled for a virtual visit with your provider today.    Just as we do with appointments in the office, we must obtain your consent to participate.  Your consent will be active for this visit and any virtual visit you may have with one of our providers in the next 365 days.    If you have a MyChart account, I can also send a copy of this consent to you electronically.  All virtual visits are billed to your insurance company just like a traditional visit in the office.  As this is a virtual visit, video technology does not allow for your provider to perform a traditional examination.  This may limit your provider's ability to fully assess your condition.  If your provider identifies any concerns that need to be evaluated in person or the need to arrange testing such as labs, EKG, etc, we will make arrangements to do so.    Although advances in technology are sophisticated, we cannot ensure that it will always work on either your end or our end.  If the connection with a video visit is poor, we may have to switch to a telephone visit.  With either a video or telephone visit, we are not always able to ensure that we have a secure connection.   I need to obtain your verbal consent now.   Are you willing to proceed with your visit today?   Abigail Edwards has provided verbal consent on 05/14/2020 for a virtual visit (video or telephone).   Carroll Kinds, CMA 05/14/2020  4:14 PM

## 2020-05-20 ENCOUNTER — Other Ambulatory Visit: Payer: Self-pay

## 2020-05-20 ENCOUNTER — Other Ambulatory Visit: Payer: Self-pay | Admitting: Nurse Practitioner

## 2020-05-20 DIAGNOSIS — M5440 Lumbago with sciatica, unspecified side: Secondary | ICD-10-CM

## 2020-05-20 DIAGNOSIS — G8929 Other chronic pain: Secondary | ICD-10-CM

## 2020-05-20 DIAGNOSIS — F32A Depression, unspecified: Secondary | ICD-10-CM

## 2020-05-20 MED ORDER — HYDROCODONE-ACETAMINOPHEN 10-325 MG PO TABS: ORAL_TABLET | ORAL | 0 refills | Status: DC

## 2020-05-20 NOTE — Telephone Encounter (Signed)
RX last filled on 04/21/2020, treatment agreement on file from 02/2020

## 2020-06-20 ENCOUNTER — Other Ambulatory Visit: Payer: Self-pay | Admitting: *Deleted

## 2020-06-20 DIAGNOSIS — M5440 Lumbago with sciatica, unspecified side: Secondary | ICD-10-CM

## 2020-06-20 DIAGNOSIS — G8929 Other chronic pain: Secondary | ICD-10-CM

## 2020-06-20 MED ORDER — HYDROCODONE-ACETAMINOPHEN 10-325 MG PO TABS: ORAL_TABLET | ORAL | 0 refills | Status: DC

## 2020-06-20 NOTE — Telephone Encounter (Signed)
Patient requested refill Epic LR: 05/20/2020 Contract on [facility] and sent to [place] for approval.

## 2020-07-22 ENCOUNTER — Other Ambulatory Visit: Payer: Self-pay | Admitting: *Deleted

## 2020-07-22 DIAGNOSIS — G8929 Other chronic pain: Secondary | ICD-10-CM

## 2020-07-22 MED ORDER — HYDROCODONE-ACETAMINOPHEN 10-325 MG PO TABS: ORAL_TABLET | ORAL | 0 refills | Status: DC

## 2020-07-22 NOTE — Telephone Encounter (Signed)
Patient requested refill Epic LR: 06/20/2020 Contract on [facility] and sent to [facility] for approval.

## 2020-07-28 ENCOUNTER — Other Ambulatory Visit: Payer: Self-pay | Admitting: Nurse Practitioner

## 2020-07-28 DIAGNOSIS — M1990 Unspecified osteoarthritis, unspecified site: Secondary | ICD-10-CM

## 2020-07-28 NOTE — Telephone Encounter (Signed)
Patient has request refill on medication "Voltaren 1% Gel". Last refill was 04/25/2020. Patient medication has warnings. Medication pend and sent to PCP Dewaine Oats Carlos American, NP for approval.

## 2020-08-08 ENCOUNTER — Other Ambulatory Visit: Payer: Self-pay | Admitting: Nurse Practitioner

## 2020-08-08 DIAGNOSIS — K219 Gastro-esophageal reflux disease without esophagitis: Secondary | ICD-10-CM

## 2020-08-21 ENCOUNTER — Other Ambulatory Visit: Payer: Self-pay

## 2020-08-21 DIAGNOSIS — M5441 Lumbago with sciatica, right side: Secondary | ICD-10-CM

## 2020-08-21 DIAGNOSIS — M5440 Lumbago with sciatica, unspecified side: Secondary | ICD-10-CM

## 2020-08-21 MED ORDER — HYDROCODONE-ACETAMINOPHEN 10-325 MG PO TABS: ORAL_TABLET | ORAL | 0 refills | Status: DC

## 2020-08-21 NOTE — Telephone Encounter (Signed)
RX last refilled on 07/22/2020, treatment agreement on file and up to date

## 2020-08-22 ENCOUNTER — Encounter: Payer: Self-pay | Admitting: Nurse Practitioner

## 2020-08-22 ENCOUNTER — Other Ambulatory Visit: Payer: Self-pay

## 2020-08-22 ENCOUNTER — Ambulatory Visit (INDEPENDENT_AMBULATORY_CARE_PROVIDER_SITE_OTHER): Payer: Medicare Other | Admitting: Nurse Practitioner

## 2020-08-22 VITALS — BP 126/78 | HR 77 | Temp 97.1°F | Ht 63.0 in | Wt 167.0 lb

## 2020-08-22 DIAGNOSIS — E782 Mixed hyperlipidemia: Secondary | ICD-10-CM | POA: Diagnosis not present

## 2020-08-22 DIAGNOSIS — E1140 Type 2 diabetes mellitus with diabetic neuropathy, unspecified: Secondary | ICD-10-CM | POA: Diagnosis not present

## 2020-08-22 DIAGNOSIS — I10 Essential (primary) hypertension: Secondary | ICD-10-CM

## 2020-08-22 DIAGNOSIS — K219 Gastro-esophageal reflux disease without esophagitis: Secondary | ICD-10-CM

## 2020-08-22 DIAGNOSIS — F419 Anxiety disorder, unspecified: Secondary | ICD-10-CM | POA: Diagnosis not present

## 2020-08-22 DIAGNOSIS — G8929 Other chronic pain: Secondary | ICD-10-CM

## 2020-08-22 DIAGNOSIS — E034 Atrophy of thyroid (acquired): Secondary | ICD-10-CM

## 2020-08-22 DIAGNOSIS — M545 Low back pain, unspecified: Secondary | ICD-10-CM

## 2020-08-22 DIAGNOSIS — M1990 Unspecified osteoarthritis, unspecified site: Secondary | ICD-10-CM

## 2020-08-22 NOTE — Patient Instructions (Signed)
Sign record release for ophthalmologist.
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
## 2020-08-28 ENCOUNTER — Other Ambulatory Visit: Payer: Self-pay | Admitting: Nurse Practitioner

## 2020-08-28 DIAGNOSIS — I10 Essential (primary) hypertension: Secondary | ICD-10-CM

## 2020-08-29 NOTE — Telephone Encounter (Signed)
Pharmacy requested refill Pended Rx's and sent to [place] for approval due to HIGH ALERT Warning.  

## 2020-08-31 LAB — COMPLETE METABOLIC PANEL WITH GFR
AG Ratio: 1.4 (calc) (ref 1.0–2.5)
ALT: 8 U/L (ref 6–29)
AST: 17 U/L (ref 10–35)
Albumin: 3.8 g/dL (ref 3.6–5.1)
Alkaline phosphatase (APISO): 86 U/L (ref 37–153)
BUN: 9 mg/dL (ref 7–25)
CO2: 34 mmol/L — ABNORMAL HIGH (ref 20–32)
Calcium: 9.4 mg/dL (ref 8.6–10.4)
Chloride: 89 mmol/L — ABNORMAL LOW (ref 98–110)
Creat: 0.61 mg/dL (ref 0.60–0.88)
GFR, Est African American: 97 mL/min/{1.73_m2} (ref >=60)
GFR, Est Non African American: 84 mL/min/{1.73_m2} (ref >=60)
Globulin: 2.8 g/dL (calc) (ref 1.9–3.7)
Glucose, Bld: 76 mg/dL (ref 65–99)
Potassium: 3.7 mmol/L (ref 3.5–5.3)
Sodium: 133 mmol/L — ABNORMAL LOW (ref 135–146)
Total Bilirubin: 0.4 mg/dL (ref 0.2–1.2)
Total Protein: 6.6 g/dL (ref 6.1–8.1)

## 2020-08-31 LAB — CBC WITH DIFFERENTIAL/PLATELET
Absolute Monocytes: 1554 cells/uL — ABNORMAL HIGH (ref 200–950)
Basophils Absolute: 69 cells/uL (ref 0–200)
Basophils Relative: 0.7 %
Eosinophils Absolute: 574 cells/uL — ABNORMAL HIGH (ref 15–500)
Eosinophils Relative: 5.8 %
HCT: 40.7 % (ref 35.0–45.0)
Hemoglobin: 12.5 g/dL (ref 11.7–15.5)
Lymphs Abs: 2762 cells/uL (ref 850–3900)
MCH: 23.9 pg — ABNORMAL LOW (ref 27.0–33.0)
MCHC: 30.7 g/dL — ABNORMAL LOW (ref 32.0–36.0)
MCV: 78 fL — ABNORMAL LOW (ref 80.0–100.0)
MPV: 10.8 fL (ref 7.5–12.5)
Monocytes Relative: 15.7 %
Neutro Abs: 4940 cells/uL (ref 1500–7800)
Neutrophils Relative %: 49.9 %
Platelets: 252 10*3/uL (ref 140–400)
RBC: 5.22 10*6/uL — ABNORMAL HIGH (ref 3.80–5.10)
RDW: 14.9 % (ref 11.0–15.0)
Total Lymphocyte: 27.9 %
WBC: 9.9 10*3/uL (ref 3.8–10.8)

## 2020-08-31 LAB — HEMOGLOBIN A1C
Hgb A1c MFr Bld: 5.5 % of total Hgb (ref <5.7)
Mean Plasma Glucose: 111 mg/dL
eAG (mmol/L): 6.2 mmol/L

## 2020-08-31 LAB — DRUG MONITOR, PANEL 1, W/CONF, URINE
Amphetamines: NEGATIVE ng/mL (ref <500)
Barbiturates: NEGATIVE ng/mL (ref <300)
Benzodiazepines: NEGATIVE ng/mL (ref <100)
Cocaine Metabolite: NEGATIVE ng/mL (ref <150)
Codeine: NEGATIVE ng/mL (ref <50)
Creatinine: 13.9 mg/dL — ABNORMAL LOW (ref >=20.0)
Hydrocodone: 577 ng/mL — ABNORMAL HIGH (ref <50)
Hydromorphone: NEGATIVE ng/mL (ref <50)
Marijuana Metabolite: NEGATIVE ng/mL (ref <20)
Methadone Metabolite: NEGATIVE ng/mL (ref <100)
Morphine: NEGATIVE ng/mL (ref <50)
Norhydrocodone: 508 ng/mL — ABNORMAL HIGH (ref <50)
Noroxycodone: 378 ng/mL — ABNORMAL HIGH (ref <50)
Opiates: POSITIVE ng/mL — AB (ref <100)
Oxidant: NEGATIVE ug/mL (ref <200)
Oxycodone: 141 ng/mL — ABNORMAL HIGH (ref <50)
Oxycodone: POSITIVE ng/mL — AB (ref <100)
Oxymorphone: NEGATIVE ng/mL (ref <50)
Phencyclidine: NEGATIVE ng/mL (ref <25)
Specific Gravity: 1.003 (ref >=1.003)
pH: 7.4 (ref 4.5–9.0)

## 2020-08-31 LAB — LIPID PANEL
Cholesterol: 129 mg/dL (ref <200)
HDL: 44 mg/dL — ABNORMAL LOW (ref >=50)
LDL Cholesterol (Calc): 60 mg/dL (calc)
Non-HDL Cholesterol (Calc): 85 mg/dL (calc) (ref <130)
Total CHOL/HDL Ratio: 2.9 (calc) (ref <5.0)
Triglycerides: 167 mg/dL — ABNORMAL HIGH (ref <150)

## 2020-08-31 LAB — DM TEMPLATE

## 2020-08-31 LAB — MICROALBUMIN, URINE: Microalb, Ur: <0.2 mg/dL

## 2020-09-19 ENCOUNTER — Other Ambulatory Visit: Payer: Self-pay

## 2020-09-19 DIAGNOSIS — M5440 Lumbago with sciatica, unspecified side: Secondary | ICD-10-CM

## 2020-09-19 DIAGNOSIS — G8929 Other chronic pain: Secondary | ICD-10-CM

## 2020-09-19 MED ORDER — HYDROCODONE-ACETAMINOPHEN 10-325 MG PO TABS: ORAL_TABLET | ORAL | 0 refills | Status: DC

## 2020-09-19 NOTE — Telephone Encounter (Signed)
RX last refilled 08/21/2020, treatment agreement on file from January 2022

## 2020-10-20 ENCOUNTER — Other Ambulatory Visit: Payer: Self-pay

## 2020-10-20 DIAGNOSIS — M5440 Lumbago with sciatica, unspecified side: Secondary | ICD-10-CM

## 2020-10-20 DIAGNOSIS — G8929 Other chronic pain: Secondary | ICD-10-CM

## 2020-10-20 MED ORDER — HYDROCODONE-ACETAMINOPHEN 10-325 MG PO TABS
ORAL_TABLET | ORAL | 0 refills | Status: DC
Start: 2020-10-20 — End: 2020-11-24

## 2020-10-20 NOTE — Telephone Encounter (Signed)
Patient called requesting refill on Hydrocodone 10/'325mg'$  take one tablet every 8 hours as needed for pain. No updated contract on file. Medication pended and sent to [facility]i [place] for approval.

## 2020-11-24 ENCOUNTER — Other Ambulatory Visit: Payer: Self-pay | Admitting: Nurse Practitioner

## 2020-11-24 DIAGNOSIS — G8929 Other chronic pain: Secondary | ICD-10-CM

## 2020-11-24 DIAGNOSIS — K219 Gastro-esophageal reflux disease without esophagitis: Secondary | ICD-10-CM

## 2020-11-24 DIAGNOSIS — M5440 Lumbago with sciatica, unspecified side: Secondary | ICD-10-CM

## 2020-11-24 MED ORDER — HYDROCODONE-ACETAMINOPHEN 10-325 MG PO TABS: ORAL_TABLET | ORAL | 0 refills | Status: DC

## 2020-11-24 NOTE — Telephone Encounter (Signed)
RX last filled 10/20/20. Treatment agreement up to date 02/2020

## 2020-12-23 ENCOUNTER — Other Ambulatory Visit: Payer: Self-pay | Admitting: *Deleted

## 2020-12-23 DIAGNOSIS — G8929 Other chronic pain: Secondary | ICD-10-CM

## 2020-12-23 MED ORDER — HYDROCODONE-ACETAMINOPHEN 10-325 MG PO TABS: ORAL_TABLET | ORAL | 0 refills | Status: DC

## 2020-12-23 NOTE — Telephone Encounter (Signed)
Patient requested refill.  Epic LR: 11/24/2020 Contract on [facility] and sent to [place] for approval.

## 2021-01-21 ENCOUNTER — Other Ambulatory Visit: Payer: Self-pay | Admitting: *Deleted

## 2021-01-21 DIAGNOSIS — G8929 Other chronic pain: Secondary | ICD-10-CM

## 2021-01-21 DIAGNOSIS — M5440 Lumbago with sciatica, unspecified side: Secondary | ICD-10-CM

## 2021-01-21 NOTE — Telephone Encounter (Signed)
Patient requested refill.  Epic LR: 12/23/2020 Contract Date: 03/03/20 (Added note to upcoming appointment to update contract) Pended Rx and sent to [facility] for approval.

## 2021-01-22 MED ORDER — HYDROCODONE-ACETAMINOPHEN 10-325 MG PO TABS: ORAL_TABLET | ORAL | 0 refills | Status: DC

## 2021-01-26 ENCOUNTER — Other Ambulatory Visit: Payer: Self-pay | Admitting: Nurse Practitioner

## 2021-01-26 DIAGNOSIS — E782 Mixed hyperlipidemia: Secondary | ICD-10-CM

## 2021-02-20 ENCOUNTER — Other Ambulatory Visit: Payer: Self-pay | Admitting: *Deleted

## 2021-02-20 DIAGNOSIS — M5440 Lumbago with sciatica, unspecified side: Secondary | ICD-10-CM

## 2021-02-20 MED ORDER — HYDROCODONE-ACETAMINOPHEN 10-325 MG PO TABS: ORAL_TABLET | ORAL | 0 refills | Status: DC

## 2021-02-20 NOTE — Telephone Encounter (Signed)
Patient requested refill.  Epic LR: 01/22/2021 Contract Date: 03/03/20 (added note to upcoming appointment to update) Pended Rx and sent to [facility] for approval.

## 2021-02-27 ENCOUNTER — Encounter: Payer: Self-pay | Admitting: Nurse Practitioner

## 2021-02-27 ENCOUNTER — Ambulatory Visit (INDEPENDENT_AMBULATORY_CARE_PROVIDER_SITE_OTHER): Payer: Medicare Other | Admitting: Nurse Practitioner

## 2021-02-27 ENCOUNTER — Other Ambulatory Visit: Payer: Self-pay

## 2021-02-27 VITALS — BP 126/78 | HR 72 | Temp 97.1°F | Ht 63.0 in | Wt 162.0 lb

## 2021-02-27 DIAGNOSIS — F419 Anxiety disorder, unspecified: Secondary | ICD-10-CM | POA: Diagnosis not present

## 2021-02-27 DIAGNOSIS — E1140 Type 2 diabetes mellitus with diabetic neuropathy, unspecified: Secondary | ICD-10-CM | POA: Diagnosis not present

## 2021-02-27 DIAGNOSIS — M1990 Unspecified osteoarthritis, unspecified site: Secondary | ICD-10-CM

## 2021-02-27 DIAGNOSIS — Z79899 Other long term (current) drug therapy: Secondary | ICD-10-CM

## 2021-02-27 DIAGNOSIS — D509 Iron deficiency anemia, unspecified: Secondary | ICD-10-CM

## 2021-02-27 DIAGNOSIS — M545 Low back pain, unspecified: Secondary | ICD-10-CM

## 2021-02-27 DIAGNOSIS — E782 Mixed hyperlipidemia: Secondary | ICD-10-CM | POA: Diagnosis not present

## 2021-02-27 DIAGNOSIS — I10 Essential (primary) hypertension: Secondary | ICD-10-CM

## 2021-02-27 DIAGNOSIS — G8929 Other chronic pain: Secondary | ICD-10-CM

## 2021-02-27 DIAGNOSIS — K219 Gastro-esophageal reflux disease without esophagitis: Secondary | ICD-10-CM

## 2021-02-27 DIAGNOSIS — E034 Atrophy of thyroid (acquired): Secondary | ICD-10-CM

## 2021-02-27 MED ORDER — GABAPENTIN 300 MG PO CAPS: ORAL_CAPSULE | ORAL | 1 refills | Status: DC

## 2021-02-27 MED ORDER — BUSPIRONE HCL 5 MG PO TABS: 5.0000 mg | ORAL_TABLET | Freq: Two times a day (BID) | ORAL | 1 refills | Status: DC

## 2021-02-27 NOTE — Patient Instructions (Addendum)
To start buspar 5 mg by mouth twice daily to help with anxiety  Please sign a record release [facility] for diabetic eye exam  on check out.    To get shingles vaccine at local pharmacy  To get TDAP and COVID booster at pharmacy
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
## 2021-02-28 LAB — CBC WITH DIFFERENTIAL/PLATELET
Absolute Monocytes: 1515 cells/uL — ABNORMAL HIGH (ref 200–950)
Basophils Absolute: 71 cells/uL (ref 0–200)
Basophils Relative: 0.7 %
Eosinophils Absolute: 293 cells/uL (ref 15–500)
Eosinophils Relative: 2.9 %
HCT: 40.3 % (ref 35.0–45.0)
Hemoglobin: 12.8 g/dL (ref 11.7–15.5)
Lymphs Abs: 2454 cells/uL (ref 850–3900)
MCH: 24.3 pg — ABNORMAL LOW (ref 27.0–33.0)
MCHC: 31.8 g/dL — ABNORMAL LOW (ref 32.0–36.0)
MCV: 76.5 fL — ABNORMAL LOW (ref 80.0–100.0)
MPV: 11.2 fL (ref 7.5–12.5)
Monocytes Relative: 15 %
Neutro Abs: 5767 cells/uL (ref 1500–7800)
Neutrophils Relative %: 57.1 %
Platelets: 276 10*3/uL (ref 140–400)
RBC: 5.27 10*6/uL — ABNORMAL HIGH (ref 3.80–5.10)
RDW: 15.6 % — ABNORMAL HIGH (ref 11.0–15.0)
Total Lymphocyte: 24.3 %
WBC: 10.1 10*3/uL (ref 3.8–10.8)

## 2021-02-28 LAB — LIPID PANEL
Cholesterol: 131 mg/dL (ref <200)
HDL: 55 mg/dL (ref >=50)
LDL Cholesterol (Calc): 55 mg/dL (calc)
Non-HDL Cholesterol (Calc): 76 mg/dL (calc) (ref <130)
Total CHOL/HDL Ratio: 2.4 (calc) (ref <5.0)
Triglycerides: 130 mg/dL (ref <150)

## 2021-02-28 LAB — COMPLETE METABOLIC PANEL WITH GFR
AG Ratio: 1.4 (calc) (ref 1.0–2.5)
ALT: 10 U/L (ref 6–29)
AST: 19 U/L (ref 10–35)
Albumin: 4.1 g/dL (ref 3.6–5.1)
Alkaline phosphatase (APISO): 92 U/L (ref 37–153)
BUN: 11 mg/dL (ref 7–25)
CO2: 36 mmol/L — ABNORMAL HIGH (ref 20–32)
Calcium: 9.4 mg/dL (ref 8.6–10.4)
Chloride: 89 mmol/L — ABNORMAL LOW (ref 98–110)
Creat: 0.64 mg/dL (ref 0.60–0.95)
Globulin: 3 g/dL (calc) (ref 1.9–3.7)
Glucose, Bld: 80 mg/dL (ref 65–99)
Potassium: 3.8 mmol/L (ref 3.5–5.3)
Sodium: 133 mmol/L — ABNORMAL LOW (ref 135–146)
Total Bilirubin: 0.7 mg/dL (ref 0.2–1.2)
Total Protein: 7.1 g/dL (ref 6.1–8.1)
eGFR: 87 mL/min/{1.73_m2} (ref >=60)

## 2021-02-28 LAB — TSH: TSH: 1.83 mIU/L (ref 0.40–4.50)

## 2021-03-01 LAB — DM TEMPLATE

## 2021-03-01 LAB — DRUG MONITOR, ALCOHOLMETAB, QN, URINE
ETG: NEGATIVE ng/mL (ref <500)
ETS: NEGATIVE ng/mL (ref <100)

## 2021-03-20 ENCOUNTER — Other Ambulatory Visit: Payer: Self-pay | Admitting: Nurse Practitioner

## 2021-03-20 ENCOUNTER — Other Ambulatory Visit: Payer: Self-pay | Admitting: *Deleted

## 2021-03-20 DIAGNOSIS — G8929 Other chronic pain: Secondary | ICD-10-CM

## 2021-03-20 DIAGNOSIS — I10 Essential (primary) hypertension: Secondary | ICD-10-CM

## 2021-03-20 MED ORDER — HYDROCODONE-ACETAMINOPHEN 10-325 MG PO TABS: ORAL_TABLET | ORAL | 0 refills | Status: DC

## 2021-03-20 NOTE — Telephone Encounter (Signed)
Patient requested refill.  Epic LR: 02/20/2021 Contract Date: 02/27/2021 Pended Rx and sent to [facility] for approval.

## 2021-04-07 ENCOUNTER — Other Ambulatory Visit: Payer: Self-pay | Admitting: Nurse Practitioner

## 2021-04-07 DIAGNOSIS — M5440 Lumbago with sciatica, unspecified side: Secondary | ICD-10-CM

## 2021-04-07 DIAGNOSIS — G8929 Other chronic pain: Secondary | ICD-10-CM

## 2021-04-07 DIAGNOSIS — F32A Depression, unspecified: Secondary | ICD-10-CM

## 2021-04-13 ENCOUNTER — Other Ambulatory Visit: Payer: Self-pay | Admitting: Nurse Practitioner

## 2021-04-13 DIAGNOSIS — I10 Essential (primary) hypertension: Secondary | ICD-10-CM

## 2021-04-13 DIAGNOSIS — R252 Cramp and spasm: Secondary | ICD-10-CM

## 2021-04-13 NOTE — Telephone Encounter (Signed)
Patient has request refill on medication "Potassium Chloride". Patient medication last refilled 04/17/2020. Patient medication has High Risk Warnings. Medication pend and sent to PCP Lauree Chandler, NP .

## 2021-04-14 ENCOUNTER — Other Ambulatory Visit: Payer: Self-pay | Admitting: Nurse Practitioner

## 2021-04-14 DIAGNOSIS — F419 Anxiety disorder, unspecified: Secondary | ICD-10-CM

## 2021-04-17 ENCOUNTER — Other Ambulatory Visit: Payer: Self-pay | Admitting: *Deleted

## 2021-04-17 DIAGNOSIS — M5440 Lumbago with sciatica, unspecified side: Secondary | ICD-10-CM

## 2021-04-17 DIAGNOSIS — G8929 Other chronic pain: Secondary | ICD-10-CM

## 2021-04-17 NOTE — Telephone Encounter (Signed)
Patient called requesting refill.  Epic LR: 03/20/2021 Contract Date: 02/27/2021 Pended Rx and sent to [facility] for approval.

## 2021-04-20 MED ORDER — HYDROCODONE-ACETAMINOPHEN 10-325 MG PO TABS: ORAL_TABLET | ORAL | 0 refills | Status: DC

## 2021-04-20 NOTE — Telephone Encounter (Signed)
Patient Notified

## 2021-05-07 ENCOUNTER — Other Ambulatory Visit: Payer: Self-pay | Admitting: Nurse Practitioner

## 2021-05-07 DIAGNOSIS — G8929 Other chronic pain: Secondary | ICD-10-CM

## 2021-05-07 DIAGNOSIS — F32A Depression, unspecified: Secondary | ICD-10-CM

## 2021-05-12 ENCOUNTER — Other Ambulatory Visit: Payer: Self-pay | Admitting: Nurse Practitioner

## 2021-05-12 DIAGNOSIS — M1990 Unspecified osteoarthritis, unspecified site: Secondary | ICD-10-CM

## 2021-05-19 ENCOUNTER — Other Ambulatory Visit: Payer: Self-pay

## 2021-05-19 ENCOUNTER — Encounter: Payer: Self-pay | Admitting: Nurse Practitioner

## 2021-05-19 ENCOUNTER — Encounter: Payer: Medicare Other | Admitting: Nurse Practitioner

## 2021-05-19 ENCOUNTER — Telehealth: Payer: Self-pay

## 2021-05-19 ENCOUNTER — Other Ambulatory Visit: Payer: Self-pay | Admitting: *Deleted

## 2021-05-19 DIAGNOSIS — G8929 Other chronic pain: Secondary | ICD-10-CM

## 2021-05-19 MED ORDER — HYDROCODONE-ACETAMINOPHEN 10-325 MG PO TABS: ORAL_TABLET | ORAL | 0 refills | Status: DC

## 2021-05-19 NOTE — Patient Instructions (Signed)
Abigail Edwards , ?Thank you for taking time to come for your Medicare Wellness Visit. I appreciate your ongoing commitment to your health goals. Please review the following plan we discussed and let me know if I can assist you in the future.  ? ?Screening recommendations/referrals: ?Colonoscopy aged out ?Mammogram aged out ?Bone Density declines ?Recommended yearly ophthalmology/optometry visit for glaucoma screening and checkup ?Recommended yearly dental visit for hygiene and checkup ? ?Vaccinations: ?Influenza vaccine up to date ?Pneumococcal vaccine up to date ?Tdap vaccine DUE- recommend to get at your local pharmacy    ?Shingles vaccine DUE- recommend to get at your local pharmacy      ? ?Advanced directives: on file ? ?Conditions/risks identified: advance age ? ?Next appointment: yearly ? ? ?Preventive Care 29 Years and Older, Female ?Preventive care refers to lifestyle choices and visits with your health care provider that can promote health and wellness. ?What does preventive care include? ?A yearly physical exam. This is also called an annual well check. ?Dental exams once or twice a year. ?Routine eye exams. Ask your health care provider how often you should have your eyes checked. ?Personal lifestyle choices, including: ?Daily care of your teeth and gums. ?Regular physical activity. ?Eating a healthy diet. ?Avoiding tobacco and drug use. ?Limiting alcohol use. ?Practicing safe sex. ?Taking low-dose aspirin every day. ?Taking vitamin and mineral supplements as recommended by your health care provider. ?What happens during an annual well check? ?The services and screenings done by your health care provider during your annual well check will depend on your age, overall health, lifestyle risk factors, and family history of disease. ?Counseling  ?Your health care provider may ask you questions about your: ?Alcohol use. ?Tobacco use. ?Drug use. ?Emotional well-being. ?Home and relationship well-being. ?Sexual  activity. ?Eating habits. ?History of falls. ?Memory and ability to understand (cognition). ?Work and work Statistician. ?Reproductive health. ?Screening  ?You may have the following tests or measurements: ?Height, weight, and BMI. ?Blood pressure. ?Lipid and cholesterol levels. These may be checked every 5 years, or more frequently if you are over 37 years old. ?Skin check. ?Lung cancer screening. You may have this screening every year starting at age 2 if you have a 30-pack-year history of smoking and currently smoke or have quit within the past 15 years. ?Fecal occult blood test (FOBT) of the stool. You may have this test every year starting at age 30. ?Flexible sigmoidoscopy or colonoscopy. You may have a sigmoidoscopy every 5 years or a colonoscopy every 10 years starting at age 47. ?Hepatitis C blood test. ?Hepatitis B blood test. ?Sexually transmitted disease (STD) testing. ?Diabetes screening. This is done by checking your blood sugar (glucose) after you have not eaten for a while (fasting). You may have this done every 1-3 years. ?Bone density scan. This is done to screen for osteoporosis. You may have this done starting at age 28. ?Mammogram. This may be done every 1-2 years. Talk to your health care provider about how often you should have regular mammograms. ?Talk with your health care provider about your test results, treatment options, and if necessary, the need for more tests. ?Vaccines  ?Your health care provider may recommend certain vaccines, such as: ?Influenza vaccine. This is recommended every year. ?Tetanus, diphtheria, and acellular pertussis (Tdap, Td) vaccine. You may need a Td booster every 10 years. ?Zoster vaccine. You may need this after age 6. ?Pneumococcal 13-valent conjugate (PCV13) vaccine. One dose is recommended after age 41. ?Pneumococcal polysaccharide (PPSV23) vaccine. One dose  is recommended after age 25. ?Talk to your health care provider about which screenings and vaccines  you need and how often you need them. ?This information is not intended to replace advice given to you by your health care provider. Make sure you discuss any questions you have with your health care provider. ?Document Released: 03/07/2015 Document Revised: 10/29/2015 Document Reviewed: 12/10/2014 ?Elsevier Interactive Patient Education ? 2017 [place]. ? ?Fall Prevention in the Home ?Falls can cause injuries. They can happen to people of all ages. There are many things you can do to make your home safe and to help prevent falls. ?What can I do on the outside of my home? ?Regularly fix the edges of walkways and driveways and fix any cracks. ?Remove anything that might make you trip as you walk through a door, such as a raised step or threshold. ?Trim any bushes or trees on the path to your home. ?Use bright outdoor lighting. ?Clear any walking paths of anything that might make someone trip, such as rocks or tools. ?Regularly check to see if handrails are loose or broken. Make sure that both sides of any steps have handrails. ?Any raised decks and porches should have guardrails on the edges. ?Have any leaves, snow, or ice cleared regularly. ?Use sand or salt on walking paths during winter. ?Clean up any spills in your garage right away. This includes oil or grease spills. ?What can I do in the bathroom? ?Use night lights. ?Install grab bars by the toilet and in the tub and shower. Do not use towel bars as grab bars. ?Use non-skid mats or decals in the tub or shower. ?If you need to sit down in the shower, use a plastic, non-slip stool. ?Keep the floor dry. Clean up any water that spills on the floor as soon as it happens. ?Remove soap buildup in the tub or shower regularly. ?Attach bath mats securely with double-sided non-slip rug tape. ?Do not have throw rugs and other things on the floor that can make you trip. ?What can I do in the bedroom? ?Use night lights. ?Make sure that you have a light by your bed that  is easy to reach. ?Do not use any sheets or blankets that are too big for your bed. They should not hang down onto the floor. ?Have a firm chair that has side arms. You can use this for support while you get dressed. ?Do not have throw rugs and other things on the floor that can make you trip. ?What can I do in the kitchen? ?Clean up any spills right away. ?Avoid walking on wet floors. ?Keep items that you use a lot in easy-to-reach places. ?If you need to reach something above you, use a strong step stool that has a grab bar. ?Keep electrical cords out of the way. ?Do not use floor polish or wax that makes floors slippery. If you must use wax, use non-skid floor wax. ?Do not have throw rugs and other things on the floor that can make you trip. ?What can I do with my stairs? ?Do not leave any items on the stairs. ?Make sure that there are handrails on both sides of the stairs and use them. Fix handrails that are broken or loose. Make sure that handrails are as long as the stairways. ?Check any carpeting to make sure that it is firmly attached to the stairs. Fix any carpet that is loose or worn. ?Avoid having throw rugs at the top or bottom of the stairs.  If you do have throw rugs, attach them to the floor with carpet tape. ?Make sure that you have a light switch at the top of the stairs and the bottom of the stairs. If you do not have them, ask someone to add them for you. ?What else can I do to help prevent falls? ?Wear shoes that: ?Do not have high heels. ?Have rubber bottoms. ?Are comfortable and fit you well. ?Are closed at the toe. Do not wear sandals. ?If you use a stepladder: ?Make sure that it is fully opened. Do not climb a closed stepladder. ?Make sure that both sides of the stepladder are locked into place. ?Ask someone to hold it for you, if possible. ?Clearly mark and make sure that you can see: ?Any grab bars or handrails. ?First and last steps. ?Where the edge of each step is. ?Use tools that help you  move around (mobility aids) if they are needed. These include: ?Canes. ?Walkers. ?Scooters. ?Crutches. ?Turn on the lights when you go into a dark area. Replace any light bulbs as soon as they burn out. ?Se

## 2021-05-19 NOTE — Telephone Encounter (Signed)
I attempted to call patient to conduct annual wellness visit. Patient did not answer,however patient is unable to do AWV over the phone because patient lives in [place] and will need to schedule AWV in office. Left message for patient to call office to reschedule for in office appointment. ?

## 2021-05-19 NOTE — Telephone Encounter (Signed)
Patient requested refill.  ?Epic LR: 04/20/2021 ?Contract Date: 02/27/2021 ?Pended Rx and sent to [facility] for approval.  ?
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
## 2021-06-17 ENCOUNTER — Other Ambulatory Visit: Payer: Self-pay

## 2021-06-17 DIAGNOSIS — G8929 Other chronic pain: Secondary | ICD-10-CM

## 2021-06-17 MED ORDER — HYDROCODONE-ACETAMINOPHEN 10-325 MG PO TABS: ORAL_TABLET | ORAL | 0 refills | Status: DC

## 2021-06-17 NOTE — Telephone Encounter (Signed)
RX last refilled on 05/19/2021, per patient this is the 30 day and she is in need of a refill ? ?Treatment agreement is on file within the last 12 months  ?

## 2021-06-24 ENCOUNTER — Other Ambulatory Visit: Payer: Self-pay | Admitting: Nurse Practitioner

## 2021-06-24 DIAGNOSIS — F419 Anxiety disorder, unspecified: Secondary | ICD-10-CM

## 2021-07-10 ENCOUNTER — Other Ambulatory Visit: Payer: Self-pay | Admitting: Nurse Practitioner

## 2021-07-10 DIAGNOSIS — F419 Anxiety disorder, unspecified: Secondary | ICD-10-CM

## 2021-07-16 ENCOUNTER — Other Ambulatory Visit: Payer: Self-pay | Admitting: *Deleted

## 2021-07-16 DIAGNOSIS — G8929 Other chronic pain: Secondary | ICD-10-CM

## 2021-07-16 MED ORDER — HYDROCODONE-ACETAMINOPHEN 10-325 MG PO TABS: ORAL_TABLET | ORAL | 0 refills | Status: DC

## 2021-07-16 NOTE — Telephone Encounter (Signed)
Patient requested refill.  Epic LR: 06/17/2021 Contract Date: 02-27-2021 Pended Rx and sent to [place] for approval due to Janett Billow out of office.

## 2021-08-14 ENCOUNTER — Other Ambulatory Visit: Payer: Self-pay | Admitting: *Deleted

## 2021-08-14 DIAGNOSIS — G8929 Other chronic pain: Secondary | ICD-10-CM

## 2021-08-14 MED ORDER — HYDROCODONE-ACETAMINOPHEN 10-325 MG PO TABS: ORAL_TABLET | ORAL | 0 refills | Status: DC

## 2021-08-14 NOTE — Telephone Encounter (Signed)
Patient requested refill.  Epic LR: 07/16/2021 Contract Date: 02/27/2021 Pended Rx and sent to [facility] for approval.

## 2021-08-28 ENCOUNTER — Ambulatory Visit: Payer: Medicare Other | Admitting: Nurse Practitioner

## 2021-09-11 ENCOUNTER — Other Ambulatory Visit: Payer: Self-pay

## 2021-09-11 DIAGNOSIS — G8929 Other chronic pain: Secondary | ICD-10-CM

## 2021-09-11 MED ORDER — HYDROCODONE-ACETAMINOPHEN 10-325 MG PO TABS: ORAL_TABLET | ORAL | 0 refills | Status: DC

## 2021-09-11 NOTE — Telephone Encounter (Signed)
Patient called and states that pharmacy will be closed tomorrow and the next day. Patient has request refill on medication Hydrocodone 10-'325mg'$ . Patient last refill dated 08/14/2021. Patient has Opioid Contract signed 02/27/2021. Medication pend and sent to PCP Abigail Oats Carlos American, NP for approval.

## 2021-09-18 ENCOUNTER — Ambulatory Visit (INDEPENDENT_AMBULATORY_CARE_PROVIDER_SITE_OTHER): Payer: 59 | Admitting: Nurse Practitioner

## 2021-09-18 ENCOUNTER — Encounter: Payer: Self-pay | Admitting: Nurse Practitioner

## 2021-09-18 VITALS — BP 138/78 | HR 100 | Temp 97.9°F | Ht 63.0 in | Wt 166.0 lb

## 2021-09-18 DIAGNOSIS — K219 Gastro-esophageal reflux disease without esophagitis: Secondary | ICD-10-CM

## 2021-09-18 DIAGNOSIS — M1990 Unspecified osteoarthritis, unspecified site: Secondary | ICD-10-CM | POA: Diagnosis not present

## 2021-09-18 DIAGNOSIS — M545 Low back pain, unspecified: Secondary | ICD-10-CM

## 2021-09-18 DIAGNOSIS — G8929 Other chronic pain: Secondary | ICD-10-CM

## 2021-09-18 DIAGNOSIS — D509 Iron deficiency anemia, unspecified: Secondary | ICD-10-CM

## 2021-09-18 DIAGNOSIS — I10 Essential (primary) hypertension: Secondary | ICD-10-CM | POA: Diagnosis not present

## 2021-09-18 DIAGNOSIS — E782 Mixed hyperlipidemia: Secondary | ICD-10-CM | POA: Diagnosis not present

## 2021-09-18 DIAGNOSIS — E034 Atrophy of thyroid (acquired): Secondary | ICD-10-CM

## 2021-09-18 DIAGNOSIS — E1140 Type 2 diabetes mellitus with diabetic neuropathy, unspecified: Secondary | ICD-10-CM | POA: Diagnosis not present
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
## 2021-09-19 LAB — HEMOGLOBIN A1C
Hgb A1c MFr Bld: 5.7 % of total Hgb — ABNORMAL HIGH (ref <5.7)
Mean Plasma Glucose: 117 mg/dL
eAG (mmol/L): 6.5 mmol/L

## 2021-09-19 LAB — COMPLETE METABOLIC PANEL WITH GFR
AG Ratio: 1.8 (calc) (ref 1.0–2.5)
ALT: 10 U/L (ref 6–29)
AST: 17 U/L (ref 10–35)
Albumin: 4.3 g/dL (ref 3.6–5.1)
Alkaline phosphatase (APISO): 81 U/L (ref 37–153)
BUN: 10 mg/dL (ref 7–25)
CO2: 31 mmol/L (ref 20–32)
Calcium: 9.4 mg/dL (ref 8.6–10.4)
Chloride: 91 mmol/L — ABNORMAL LOW (ref 98–110)
Creat: 0.77 mg/dL (ref 0.60–0.95)
Globulin: 2.4 g/dL (calc) (ref 1.9–3.7)
Glucose, Bld: 97 mg/dL (ref 65–139)
Potassium: 3.7 mmol/L (ref 3.5–5.3)
Sodium: 133 mmol/L — ABNORMAL LOW (ref 135–146)
Total Bilirubin: 0.6 mg/dL (ref 0.2–1.2)
Total Protein: 6.7 g/dL (ref 6.1–8.1)
eGFR: 76 mL/min/{1.73_m2} (ref >=60)

## 2021-09-19 LAB — CBC WITH DIFFERENTIAL/PLATELET
Absolute Monocytes: 1534 cells/uL — ABNORMAL HIGH (ref 200–950)
Basophils Absolute: 65 cells/uL (ref 0–200)
Basophils Relative: 0.6 %
Eosinophils Absolute: 259 cells/uL (ref 15–500)
Eosinophils Relative: 2.4 %
HCT: 39.8 % (ref 35.0–45.0)
Hemoglobin: 12.5 g/dL (ref 11.7–15.5)
Lymphs Abs: 2549 cells/uL (ref 850–3900)
MCH: 23.1 pg — ABNORMAL LOW (ref 27.0–33.0)
MCHC: 31.4 g/dL — ABNORMAL LOW (ref 32.0–36.0)
MCV: 73.6 fL — ABNORMAL LOW (ref 80.0–100.0)
MPV: 10.5 fL (ref 7.5–12.5)
Monocytes Relative: 14.2 %
Neutro Abs: 6394 cells/uL (ref 1500–7800)
Neutrophils Relative %: 59.2 %
Platelets: 310 10*3/uL (ref 140–400)
RBC: 5.41 10*6/uL — ABNORMAL HIGH (ref 3.80–5.10)
RDW: 15.7 % — ABNORMAL HIGH (ref 11.0–15.0)
Total Lymphocyte: 23.6 %
WBC: 10.8 10*3/uL (ref 3.8–10.8)

## 2021-09-19 LAB — MICROALBUMIN / CREATININE URINE RATIO
Creatinine, Urine: 38 mg/dL (ref 20–275)
Microalb Creat Ratio: 21 mcg/mg creat (ref <30)
Microalb, Ur: 0.8 mg/dL

## 2021-09-21 LAB — DRUG MONITOR, BASE PANEL, W/CONF, URINE
Benzodiazepines: NEGATIVE ng/mL (ref <100)
Cocaine Metabolite: NEGATIVE ng/mL (ref <150)
Codeine: NEGATIVE ng/mL (ref <50)
Creatinine: 38 mg/dL (ref >=20.0)
Hydrocodone: 331 ng/mL — ABNORMAL HIGH (ref <50)
Hydromorphone: NEGATIVE ng/mL (ref <50)
Morphine: NEGATIVE ng/mL (ref <50)
Norhydrocodone: 789 ng/mL — ABNORMAL HIGH (ref <50)
Opiates: POSITIVE ng/mL — AB (ref <100)
Oxidant: NEGATIVE ug/mL (ref <200)
Oxycodone: NEGATIVE ng/mL (ref <100)
pH: 7.6 (ref 4.5–9.0)

## 2021-09-21 LAB — DM TEMPLATE

## 2021-09-22 LAB — IRON,TIBC AND FERRITIN PANEL
%SAT: 7 % (calc) — ABNORMAL LOW (ref 16–45)
Ferritin: 8 ng/mL — ABNORMAL LOW (ref 16–288)
Iron: 28 ug/dL — ABNORMAL LOW (ref 45–160)
TIBC: 428 mcg/dL (calc) (ref 250–450)

## 2021-09-22 LAB — TEST AUTHORIZATION: TEST CODE:: 5616

## 2021-09-28 ENCOUNTER — Other Ambulatory Visit: Payer: Self-pay | Admitting: Nurse Practitioner

## 2021-09-28 DIAGNOSIS — I10 Essential (primary) hypertension: Secondary | ICD-10-CM

## 2021-09-29 ENCOUNTER — Other Ambulatory Visit: Payer: Self-pay | Admitting: *Deleted

## 2021-09-29 DIAGNOSIS — I10 Essential (primary) hypertension: Secondary | ICD-10-CM

## 2021-09-29 NOTE — Telephone Encounter (Signed)
Patient requested rx to go to [facility] and sent to [facility] for approval due to [place].

## 2021-09-30 MED ORDER — TRIAMTERENE-HCTZ 37.5-25 MG PO TABS: 1.0000 | ORAL_TABLET | Freq: Every day | ORAL | 3 refills | Status: DC

## 2021-10-09 ENCOUNTER — Other Ambulatory Visit: Payer: Self-pay | Admitting: *Deleted

## 2021-10-09 ENCOUNTER — Other Ambulatory Visit: Payer: Self-pay | Admitting: Nurse Practitioner

## 2021-10-09 DIAGNOSIS — E782 Mixed hyperlipidemia: Secondary | ICD-10-CM

## 2021-10-09 DIAGNOSIS — G8929 Other chronic pain: Secondary | ICD-10-CM

## 2021-10-09 MED ORDER — HYDROCODONE-ACETAMINOPHEN 10-325 MG PO TABS
ORAL_TABLET | ORAL | 0 refills | Status: DC
Start: 2021-10-09 — End: 2021-11-06

## 2021-10-09 NOTE — Telephone Encounter (Signed)
Patient requested refill.  Epic LR: 09/11/2021 Contract Date: 02/27/2021  Pended Rx and sent to [facility] for approval.

## 2021-10-12 ENCOUNTER — Other Ambulatory Visit: Payer: Self-pay | Admitting: Nurse Practitioner

## 2021-10-12 DIAGNOSIS — M1990 Unspecified osteoarthritis, unspecified site: Secondary | ICD-10-CM

## 2021-10-22 ENCOUNTER — Other Ambulatory Visit: Payer: Self-pay | Admitting: Nurse Practitioner

## 2021-10-22 DIAGNOSIS — K219 Gastro-esophageal reflux disease without esophagitis: Secondary | ICD-10-CM

## 2021-11-04 ENCOUNTER — Telehealth: Payer: Self-pay

## 2021-11-04 NOTE — Telephone Encounter (Signed)
Message left on voicemail yesterday:  Patient called and was transferred to my voicemail and left a message stating she would like a return call. No details were provided as to what the call was regarding.  I returned call to patient. Patient states she received a letter from the county regarding jury duty. Patient states she is not able to partake in this service.  Patient states she called the number on the letter and was told a letter would need to be submitted from her primary care physician indicating she is not able to serve. Patient was looking for the fax number to submit the letter and stated she would call back later with that information, as she was just waking up.  Patient will ask for clinical intake assistant when she returns call.

## 2021-11-04 NOTE — Telephone Encounter (Deleted)
Patient called stating that she needs letter to excuse her from jury duty due to her medical condition faxed to [facility] court. The fax number is [phone].  Message routed to Sherrie Mustache, NP

## 2021-11-04 NOTE — Telephone Encounter (Signed)
This encounter was created in error - please disregard.

## 2021-11-04 NOTE — Telephone Encounter (Signed)
Patient returned phone call with fax number. Fax number is [phone]

## 2021-11-04 NOTE — Telephone Encounter (Signed)
Based on her age alone I think she is exempt.

## 2021-11-05 NOTE — Telephone Encounter (Signed)
Letter faxed and a copy was mailed to the patient for her record.   Detailed message left on voicemail informing patient her request was complete

## 2021-11-06 ENCOUNTER — Other Ambulatory Visit: Payer: Self-pay | Admitting: *Deleted

## 2021-11-06 DIAGNOSIS — G8929 Other chronic pain: Secondary | ICD-10-CM

## 2021-11-06 MED ORDER — HYDROCODONE-ACETAMINOPHEN 10-325 MG PO TABS: ORAL_TABLET | ORAL | 0 refills | Status: DC

## 2021-11-06 NOTE — Telephone Encounter (Signed)
Patient requested refill.  Epic LR: 10/09/2021 Contract Date: 02/27/2021 Pended Rx and sent to [facility] for approval.

## 2021-12-04 ENCOUNTER — Other Ambulatory Visit: Payer: Self-pay

## 2021-12-04 DIAGNOSIS — G8929 Other chronic pain: Secondary | ICD-10-CM

## 2021-12-04 MED ORDER — HYDROCODONE-ACETAMINOPHEN 10-325 MG PO TABS: ORAL_TABLET | ORAL | 0 refills | Status: DC

## 2021-12-07 ENCOUNTER — Other Ambulatory Visit: Payer: Self-pay

## 2021-12-07 DIAGNOSIS — G8929 Other chronic pain: Secondary | ICD-10-CM

## 2021-12-07 MED ORDER — HYDROCODONE-ACETAMINOPHEN 10-325 MG PO TABS: ORAL_TABLET | ORAL | 0 refills | Status: DC

## 2021-12-07 NOTE — Telephone Encounter (Signed)
Patient called and left vm on clinical intake line. Patient states that the [facility] she normally goes to doesn't have hydrocodone. Medication is on back order.Patient would like to have medication sent to [facility] on [address] in [place].  Medication has been pended and sent to Sherrie Mustache, NP for approval

## 2022-01-06 ENCOUNTER — Other Ambulatory Visit: Payer: Self-pay

## 2022-01-06 DIAGNOSIS — G8929 Other chronic pain: Secondary | ICD-10-CM

## 2022-01-06 MED ORDER — HYDROCODONE-ACETAMINOPHEN 10-325 MG PO TABS: ORAL_TABLET | ORAL | 0 refills | Status: DC

## 2022-01-06 NOTE — Telephone Encounter (Signed)
Patient called back to check to see if medication had been sent to pharmacy. Medication was sent to the wrong pharmacy. Patient requested medication be sent to [facility] in [place], [place].  Medication pended again with correct pharmacy and sent to Sherrie Mustache, NP

## 2022-01-06 NOTE — Telephone Encounter (Signed)
Error

## 2022-01-06 NOTE — Telephone Encounter (Signed)
Patient called and request refill on medication Hydrocodone. Patient last refill dated 12/07/2021. Patient has Opioid Contract dated 02/27/2021. Medication pend and sent to PCP Dewaine Oats Carlos American, NP for refill.

## 2022-01-07 MED ORDER — HYDROCODONE-ACETAMINOPHEN 10-325 MG PO TABS: ORAL_TABLET | ORAL | 0 refills | Status: DC

## 2022-02-04 ENCOUNTER — Other Ambulatory Visit: Payer: Self-pay | Admitting: *Deleted

## 2022-02-04 DIAGNOSIS — G8929 Other chronic pain: Secondary | ICD-10-CM

## 2022-02-04 MED ORDER — HYDROCODONE-ACETAMINOPHEN 10-325 MG PO TABS: ORAL_TABLET | ORAL | 0 refills | Status: DC

## 2022-02-04 NOTE — Telephone Encounter (Signed)
Patient requested refill.  Epic LR: 01/07/2022 Contract Date: 02/2021 Added note to upcoming appointment to update Contract.

## 2022-02-05 ENCOUNTER — Other Ambulatory Visit: Payer: Self-pay | Admitting: Nurse Practitioner

## 2022-02-05 DIAGNOSIS — I10 Essential (primary) hypertension: Secondary | ICD-10-CM

## 2022-03-05 ENCOUNTER — Telehealth: Payer: 59

## 2022-03-05 ENCOUNTER — Other Ambulatory Visit: Payer: Self-pay

## 2022-03-05 DIAGNOSIS — G8929 Other chronic pain: Secondary | ICD-10-CM

## 2022-03-05 MED ORDER — HYDROCODONE-ACETAMINOPHEN 10-325 MG PO TABS: ORAL_TABLET | ORAL | 0 refills | Status: DC

## 2022-03-05 NOTE — Telephone Encounter (Signed)
Patient called requesting refill on hydrocodone. Medication last refilled 02/04/2022. Patient has up to date contract on file.  Medication pended and sent to Windell Moulding, NP (covering provider)

## 2022-03-05 NOTE — Telephone Encounter (Signed)
Patient states that her pharmacy is completely out of her RX. LVM for her to return call about which pharmacy and medication

## 2022-03-05 NOTE — Telephone Encounter (Signed)
Pharmacy in [place] does not have prescription would like medication sent to [facility] in [place]. Medication pended with correct pharmacy and sent to Windell Moulding, NP

## 2022-03-19 ENCOUNTER — Other Ambulatory Visit: Payer: Self-pay | Admitting: Nurse Practitioner

## 2022-03-19 DIAGNOSIS — I10 Essential (primary) hypertension: Secondary | ICD-10-CM

## 2022-03-19 DIAGNOSIS — R252 Cramp and spasm: Secondary | ICD-10-CM

## 2022-03-23 ENCOUNTER — Other Ambulatory Visit: Payer: Self-pay | Admitting: Nurse Practitioner

## 2022-03-23 ENCOUNTER — Telehealth: Payer: Self-pay

## 2022-03-23 DIAGNOSIS — F419 Anxiety disorder, unspecified: Secondary | ICD-10-CM

## 2022-03-23 NOTE — Telephone Encounter (Signed)
Patient is calling about medication refill on Buspar 5 Mg Tablets. Patient medication was sent to the pharmacy that was verified by her today at 11:40 am but she is getting a message that it was denied. She was told to try and call the pharmacy and discuss the change in medication at her next appt on Friday.

## 2022-03-26 ENCOUNTER — Ambulatory Visit (INDEPENDENT_AMBULATORY_CARE_PROVIDER_SITE_OTHER): Payer: 59 | Admitting: Nurse Practitioner

## 2022-03-26 ENCOUNTER — Encounter: Payer: Self-pay | Admitting: Nurse Practitioner

## 2022-03-26 VITALS — BP 122/70 | Temp 97.6°F | Ht 63.0 in | Wt 168.0 lb

## 2022-03-26 DIAGNOSIS — D509 Iron deficiency anemia, unspecified: Secondary | ICD-10-CM

## 2022-03-26 DIAGNOSIS — E782 Mixed hyperlipidemia: Secondary | ICD-10-CM | POA: Diagnosis not present

## 2022-03-26 DIAGNOSIS — E034 Atrophy of thyroid (acquired): Secondary | ICD-10-CM

## 2022-03-26 DIAGNOSIS — E1140 Type 2 diabetes mellitus with diabetic neuropathy, unspecified: Secondary | ICD-10-CM

## 2022-03-26 DIAGNOSIS — F419 Anxiety disorder, unspecified: Secondary | ICD-10-CM

## 2022-03-26 DIAGNOSIS — M1990 Unspecified osteoarthritis, unspecified site: Secondary | ICD-10-CM | POA: Diagnosis not present

## 2022-03-26 DIAGNOSIS — I10 Essential (primary) hypertension: Secondary | ICD-10-CM | POA: Diagnosis not present

## 2022-03-26 DIAGNOSIS — G8929 Other chronic pain: Secondary | ICD-10-CM

## 2022-03-26 DIAGNOSIS — M545 Low back pain, unspecified: Secondary | ICD-10-CM

## 2022-03-26 DIAGNOSIS — K219 Gastro-esophageal reflux disease without esophagitis: Secondary | ICD-10-CM

## 2022-03-26 MED ORDER — PROBIOTIC 250 MG PO CAPS: ORAL_CAPSULE | ORAL | 6 refills | Status: DC
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
## 2022-03-26 NOTE — Patient Instructions (Signed)
Please sign a record release for [facility] on check out.

## 2022-03-31 ENCOUNTER — Other Ambulatory Visit: Payer: Self-pay

## 2022-03-31 DIAGNOSIS — D509 Iron deficiency anemia, unspecified: Secondary | ICD-10-CM

## 2022-03-31 DIAGNOSIS — D649 Anemia, unspecified: Secondary | ICD-10-CM

## 2022-03-31 DIAGNOSIS — E611 Iron deficiency: Secondary | ICD-10-CM

## 2022-03-31 LAB — COMPLETE METABOLIC PANEL WITH GFR
AG Ratio: 1.3 (calc) (ref 1.0–2.5)
ALT: 10 U/L (ref 6–29)
AST: 17 U/L (ref 10–35)
Albumin: 3.9 g/dL (ref 3.6–5.1)
Alkaline phosphatase (APISO): 75 U/L (ref 37–153)
BUN/Creatinine Ratio: 16 (calc) (ref 6–22)
BUN: 9 mg/dL (ref 7–25)
CO2: 28 mmol/L (ref 20–32)
Calcium: 9.1 mg/dL (ref 8.6–10.4)
Chloride: 92 mmol/L — ABNORMAL LOW (ref 98–110)
Creat: 0.57 mg/dL — ABNORMAL LOW (ref 0.60–0.95)
Globulin: 2.9 g/dL (calc) (ref 1.9–3.7)
Glucose, Bld: 84 mg/dL (ref 65–99)
Potassium: 3.9 mmol/L (ref 3.5–5.3)
Sodium: 133 mmol/L — ABNORMAL LOW (ref 135–146)
Total Bilirubin: 0.6 mg/dL (ref 0.2–1.2)
Total Protein: 6.8 g/dL (ref 6.1–8.1)
eGFR: 89 mL/min/{1.73_m2} (ref >=60)

## 2022-03-31 LAB — CBC WITH DIFFERENTIAL/PLATELET
Absolute Monocytes: 1507 cells/uL — ABNORMAL HIGH (ref 200–950)
Basophils Absolute: 99 cells/uL (ref 0–200)
Basophils Relative: 0.9 %
Eosinophils Absolute: 429 cells/uL (ref 15–500)
Eosinophils Relative: 3.9 %
HCT: 35.6 % (ref 35.0–45.0)
Hemoglobin: 10.9 g/dL — ABNORMAL LOW (ref 11.7–15.5)
Lymphs Abs: 2365 cells/uL (ref 850–3900)
MCH: 22.4 pg — ABNORMAL LOW (ref 27.0–33.0)
MCHC: 30.6 g/dL — ABNORMAL LOW (ref 32.0–36.0)
MCV: 73.3 fL — ABNORMAL LOW (ref 80.0–100.0)
MPV: 10.5 fL (ref 7.5–12.5)
Monocytes Relative: 13.7 %
Neutro Abs: 6600 cells/uL (ref 1500–7800)
Neutrophils Relative %: 60 %
Platelets: 301 10*3/uL (ref 140–400)
RBC: 4.86 10*6/uL (ref 3.80–5.10)
RDW: 15.4 % — ABNORMAL HIGH (ref 11.0–15.0)
Total Lymphocyte: 21.5 %
WBC: 11 10*3/uL — ABNORMAL HIGH (ref 3.8–10.8)

## 2022-03-31 LAB — IRON,TIBC AND FERRITIN PANEL
%SAT: 7 % (calc) — ABNORMAL LOW (ref 16–45)
Ferritin: 15 ng/mL — ABNORMAL LOW (ref 16–288)
Iron: 25 ug/dL — ABNORMAL LOW (ref 45–160)
TIBC: 379 mcg/dL (calc) (ref 250–450)

## 2022-03-31 LAB — HEMOGLOBIN A1C
Hgb A1c MFr Bld: 5.9 % of total Hgb — ABNORMAL HIGH (ref <5.7)
Mean Plasma Glucose: 123 mg/dL
eAG (mmol/L): 6.8 mmol/L

## 2022-03-31 LAB — DRUG MONITOR, PANEL 1, W/CONF, URINE

## 2022-03-31 LAB — TEST AUTHORIZATION

## 2022-03-31 LAB — LIPID PANEL
Cholesterol: 119 mg/dL (ref <200)
HDL: 50 mg/dL (ref >=50)
LDL Cholesterol (Calc): 50 mg/dL (calc)
Non-HDL Cholesterol (Calc): 69 mg/dL (calc) (ref <130)
Total CHOL/HDL Ratio: 2.4 (calc) (ref <5.0)
Triglycerides: 102 mg/dL (ref <150)

## 2022-03-31 LAB — DM TEMPLATE

## 2022-03-31 LAB — TSH: TSH: 0.76 mIU/L (ref 0.40–4.50)

## 2022-04-06 ENCOUNTER — Other Ambulatory Visit: Payer: Self-pay | Admitting: *Deleted

## 2022-04-06 DIAGNOSIS — G8929 Other chronic pain: Secondary | ICD-10-CM

## 2022-04-06 MED ORDER — HYDROCODONE-ACETAMINOPHEN 10-325 MG PO TABS: ORAL_TABLET | ORAL | 0 refills | Status: DC

## 2022-04-06 NOTE — Telephone Encounter (Signed)
Patient requested refill.  Epic LR: 03/05/2022 Contract Date: 03/26/2022  Pended Rx and sent to [facility] for approval.

## 2022-04-16 ENCOUNTER — Other Ambulatory Visit: Payer: Self-pay

## 2022-04-16 ENCOUNTER — Other Ambulatory Visit: Payer: Self-pay | Admitting: Nurse Practitioner

## 2022-04-16 DIAGNOSIS — K219 Gastro-esophageal reflux disease without esophagitis: Secondary | ICD-10-CM

## 2022-04-16 DIAGNOSIS — F32A Depression, unspecified: Secondary | ICD-10-CM

## 2022-04-16 DIAGNOSIS — G8929 Other chronic pain: Secondary | ICD-10-CM

## 2022-04-16 MED ORDER — PROBIOTIC 250 MG PO CAPS: ORAL_CAPSULE | ORAL | 6 refills | Status: DC

## 2022-05-03 ENCOUNTER — Ambulatory Visit (INDEPENDENT_AMBULATORY_CARE_PROVIDER_SITE_OTHER): Payer: 59 | Admitting: Nurse Practitioner

## 2022-05-03 ENCOUNTER — Encounter: Payer: Self-pay | Admitting: Nurse Practitioner

## 2022-05-03 VITALS — BP 118/66 | HR 69 | Temp 97.1°F | Ht 62.0 in | Wt 168.0 lb

## 2022-05-03 DIAGNOSIS — M5440 Lumbago with sciatica, unspecified side: Secondary | ICD-10-CM | POA: Diagnosis not present

## 2022-05-03 DIAGNOSIS — K219 Gastro-esophageal reflux disease without esophagitis: Secondary | ICD-10-CM

## 2022-05-03 DIAGNOSIS — Z0000 Encounter for general adult medical examination without abnormal findings: Secondary | ICD-10-CM

## 2022-05-03 DIAGNOSIS — G8929 Other chronic pain: Secondary | ICD-10-CM | POA: Diagnosis not present

## 2022-05-03 MED ORDER — HYDROCODONE-ACETAMINOPHEN 10-325 MG PO TABS: ORAL_TABLET | ORAL | 0 refills | Status: DC

## 2022-05-03 MED ORDER — PROBIOTIC 250 MG PO CAPS: ORAL_CAPSULE | ORAL | 11 refills | Status: AC

## 2022-05-03 NOTE — Patient Instructions (Signed)
Abigail Edwards , Thank you for taking time to come for your Medicare Wellness Visit. I appreciate your ongoing commitment to your health goals. Please review the following plan we discussed and let me know if I can assist you in the future.   Screening recommendations/referrals: Colonoscopy aged out Mammogram aged out  Bone Density declines  Recommended yearly ophthalmology/optometry visit for glaucoma screening and checkup Recommended yearly dental visit for hygiene and checkup  Vaccinations: Influenza vaccine- due annually in September/October Pneumococcal vaccine up to date Tdap vaccine DUE- recommend to get at your local pharmacy Shingles vaccine DUE- recommend to get at your local pharmacy - or if you have had to get records from local pharmacy   Advanced directives: recommended to complete   Conditions/risks identified: advanced age, fall risk, low vision   Next appointment: yearly    Preventive Care 86 Years and Older, Female Preventive care refers to lifestyle choices and visits with your health care provider that can promote health and wellness. What does preventive care include? A yearly physical exam. This is also called an annual well check. Dental exams once or twice a year. Routine eye exams. Ask your health care provider how often you should have your eyes checked. Personal lifestyle choices, including: Daily care of your teeth and gums. Regular physical activity. Eating a healthy diet. Avoiding tobacco and drug use. Limiting alcohol use. Practicing safe sex. Taking low-dose aspirin every day. Taking vitamin and mineral supplements as recommended by your health care provider. What happens during an annual well check? The services and screenings done by your health care provider during your annual well check will depend on your age, overall health, lifestyle risk factors, and family history of disease. Counseling  Your health care provider may ask you questions about  your: Alcohol use. Tobacco use. Drug use. Emotional well-being. Home and relationship well-being. Sexual activity. Eating habits. History of falls. Memory and ability to understand (cognition). Work and work Statistician. Reproductive health. Screening  You may have the following tests or measurements: Height, weight, and BMI. Blood pressure. Lipid and cholesterol levels. These may be checked every 5 years, or more frequently if you are over 35 years old. Skin check. Lung cancer screening. You may have this screening every year starting at age 31 if you have a 30-pack-year history of smoking and currently smoke or have quit within the past 15 years. Fecal occult blood test (FOBT) of the stool. You may have this test every year starting at age 28. Flexible sigmoidoscopy or colonoscopy. You may have a sigmoidoscopy every 5 years or a colonoscopy every 10 years starting at age 67. Hepatitis C blood test. Hepatitis B blood test. Sexually transmitted disease (STD) testing. Diabetes screening. This is done by checking your blood sugar (glucose) after you have not eaten for a while (fasting). You may have this done every 1-3 years. Bone density scan. This is done to screen for osteoporosis. You may have this done starting at age 44. Mammogram. This may be done every 1-2 years. Talk to your health care provider about how often you should have regular mammograms. Talk with your health care provider about your test results, treatment options, and if necessary, the need for more tests. Vaccines  Your health care provider may recommend certain vaccines, such as: Influenza vaccine. This is recommended every year. Tetanus, diphtheria, and acellular pertussis (Tdap, Td) vaccine. You may need a Td booster every 10 years. Zoster vaccine. You may need this after age 59. Pneumococcal 13-valent  conjugate (PCV13) vaccine. One dose is recommended after age 67. Pneumococcal polysaccharide (PPSV23) vaccine.  One dose is recommended after age 66. Talk to your health care provider about which screenings and vaccines you need and how often you need them. This information is not intended to replace advice given to you by your health care provider. Make sure you discuss any questions you have with your health care provider. Document Released: 03/07/2015 Document Revised: 10/29/2015 Document Reviewed: 12/10/2014 Elsevier Interactive Patient Education  2017 [place] Prevention in the Home Falls can cause injuries. They can happen to people of all ages. There are many things you can do to make your home safe and to help prevent falls. What can I do on the outside of my home? Regularly fix the edges of walkways and driveways and fix any cracks. Remove anything that might make you trip as you walk through a door, such as a raised step or threshold. Trim any bushes or trees on the path to your home. Use bright outdoor lighting. Clear any walking paths of anything that might make someone trip, such as rocks or tools. Regularly check to see if handrails are loose or broken. Make sure that both sides of any steps have handrails. Any raised decks and porches should have guardrails on the edges. Have any leaves, snow, or ice cleared regularly. Use sand or salt on walking paths during winter. Clean up any spills in your garage right away. This includes oil or grease spills. What can I do in the bathroom? Use night lights. Install grab bars by the toilet and in the tub and shower. Do not use towel bars as grab bars. Use non-skid mats or decals in the tub or shower. If you need to sit down in the shower, use a plastic, non-slip stool. Keep the floor dry. Clean up any water that spills on the floor as soon as it happens. Remove soap buildup in the tub or shower regularly. Attach bath mats securely with double-sided non-slip rug tape. Do not have throw rugs and other things on the floor that can make  you trip. What can I do in the bedroom? Use night lights. Make sure that you have a light by your bed that is easy to reach. Do not use any sheets or blankets that are too big for your bed. They should not hang down onto the floor. Have a firm chair that has side arms. You can use this for support while you get dressed. Do not have throw rugs and other things on the floor that can make you trip. What can I do in the kitchen? Clean up any spills right away. Avoid walking on wet floors. Keep items that you use a lot in easy-to-reach places. If you need to reach something above you, use a strong step stool that has a grab bar. Keep electrical cords out of the way. Do not use floor polish or wax that makes floors slippery. If you must use wax, use non-skid floor wax. Do not have throw rugs and other things on the floor that can make you trip. What can I do with my stairs? Do not leave any items on the stairs. Make sure that there are handrails on both sides of the stairs and use them. Fix handrails that are broken or loose. Make sure that handrails are as long as the stairways. Check any carpeting to make sure that it is firmly attached to the stairs. Fix any carpet that  is loose or worn. Avoid having throw rugs at the top or bottom of the stairs. If you do have throw rugs, attach them to the floor with carpet tape. Make sure that you have a light switch at the top of the stairs and the bottom of the stairs. If you do not have them, ask someone to add them for you. What else can I do to help prevent falls? Wear shoes that: Do not have high heels. Have rubber bottoms. Are comfortable and fit you well. Are closed at the toe. Do not wear sandals. If you use a stepladder: Make sure that it is fully opened. Do not climb a closed stepladder. Make sure that both sides of the stepladder are locked into place. Ask someone to hold it for you, if possible. Clearly mark and make sure that you can  see: Any grab bars or handrails. First and last steps. Where the edge of each step is. Use tools that help you move around (mobility aids) if they are needed. These include: Canes. Walkers. Scooters. Crutches. Turn on the lights when you go into a dark area. Replace any light bulbs as soon as they burn out. Set up your furniture so you have a clear path. Avoid moving your furniture around. If any of your floors are uneven, fix them. If there are any pets around you, be aware of where they are. Review your medicines with your doctor. Some medicines can make you feel dizzy. This can increase your chance of falling. Ask your doctor what other things that you can do to help prevent falls. This information is not intended to replace advice given to you by your health care provider. Make sure you discuss any questions you have with your health care provider. Document Released: 12/05/2008 Document Revised: 07/17/2015 Document Reviewed: 03/15/2014 Elsevier Interactive Patient Education  2017 [employer].
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
## 2022-05-04 LAB — FECAL GLOBIN BY IMMUNOCHEMISTRY
FECAL GLOBIN RESULT:: NOT DETECTED
MICRO NUMBER:: 14675450
SPECIMEN QUALITY:: ADEQUATE

## 2022-05-06 ENCOUNTER — Other Ambulatory Visit: Payer: Self-pay | Admitting: Nurse Practitioner

## 2022-05-06 DIAGNOSIS — F419 Anxiety disorder, unspecified: Secondary | ICD-10-CM

## 2022-05-17 ENCOUNTER — Telehealth: Payer: Self-pay

## 2022-05-17 NOTE — Telephone Encounter (Signed)
Patient called to say that her son Legrand Como faxed a copy of her shingles vaccine last week. Patient was questioning if we received fax. I informed patient that we had not received a fax as her records still indicates she is due for a second vaccine. Patient then explained that the one we have on file was her second one and the fax was to provide the date of the first shingles vaccine.  Patient asked that I contact her pharmacy for the date of shingles vaccine as it was a big production for her to get a copy and have her son go to [employer] to fax. I called [facility] in [place] and left a detailed message requesting a fax or phone call for documentation purposes. Awaiting follow-up from pharmacy.

## 2022-05-17 NOTE — Telephone Encounter (Signed)
Pharmacist Randall Hiss) called back and provided the dates of 07/27/21 and 11/02/21 for shingles vaccine dates. He stated that the 07/27/21 was not the second vaccine as patient stated it was the first one. Record updated.

## 2022-06-01 ENCOUNTER — Other Ambulatory Visit: Payer: Self-pay | Admitting: *Deleted

## 2022-06-01 DIAGNOSIS — G8929 Other chronic pain: Secondary | ICD-10-CM

## 2022-06-01 MED ORDER — HYDROCODONE-ACETAMINOPHEN 10-325 MG PO TABS: ORAL_TABLET | ORAL | 0 refills | Status: DC

## 2022-06-01 NOTE — Telephone Encounter (Signed)
Patient called requesting refill on Hydrocodone Epic LR: 05/03/2022 Contract Date: 03/26/2022  Pended Rx and sent to [facility] for approval.

## 2022-07-02 ENCOUNTER — Other Ambulatory Visit: Payer: Self-pay

## 2022-07-02 DIAGNOSIS — G8929 Other chronic pain: Secondary | ICD-10-CM

## 2022-07-02 MED ORDER — HYDROCODONE-ACETAMINOPHEN 10-325 MG PO TABS: ORAL_TABLET | ORAL | 0 refills | Status: DC

## 2022-07-02 NOTE — Telephone Encounter (Signed)
Patient called and request refill on medication Hydrocodone. Patient last refill dated 06/01/2022. Patient has Opioid Contract dated 03/26/2022. Medication pend and sent to PCP Janyth Contes Janene Harvey, NP for approval.

## 2022-07-18 ENCOUNTER — Other Ambulatory Visit: Payer: Self-pay | Admitting: Nurse Practitioner

## 2022-07-18 DIAGNOSIS — E782 Mixed hyperlipidemia: Secondary | ICD-10-CM

## 2022-07-20 ENCOUNTER — Other Ambulatory Visit: Payer: Self-pay | Admitting: Nurse Practitioner

## 2022-07-20 DIAGNOSIS — M1990 Unspecified osteoarthritis, unspecified site: Secondary | ICD-10-CM

## 2022-07-30 ENCOUNTER — Other Ambulatory Visit: Payer: Self-pay

## 2022-07-30 DIAGNOSIS — G8929 Other chronic pain: Secondary | ICD-10-CM

## 2022-07-30 MED ORDER — HYDROCODONE-ACETAMINOPHEN 10-325 MG PO TABS: ORAL_TABLET | ORAL | 0 refills | Status: DC

## 2022-07-30 NOTE — Telephone Encounter (Signed)
Patient called requesting refill on Hydrocodone.Medication last refilled 07/02/22.There is not an up to date treatment agreement on file.  Medication pended and sent to Abbey Chatters, NP

## 2022-08-15 ENCOUNTER — Other Ambulatory Visit: Payer: Self-pay | Admitting: Nurse Practitioner

## 2022-08-15 DIAGNOSIS — F419 Anxiety disorder, unspecified: Secondary | ICD-10-CM

## 2022-08-27 ENCOUNTER — Other Ambulatory Visit: Payer: Self-pay

## 2022-08-27 DIAGNOSIS — G8929 Other chronic pain: Secondary | ICD-10-CM

## 2022-08-27 MED ORDER — HYDROCODONE-ACETAMINOPHEN 10-325 MG PO TABS: ORAL_TABLET | ORAL | 0 refills | Status: DC

## 2022-08-27 NOTE — Telephone Encounter (Signed)
Patient has request refill on medication. Medication pend and sent to Richarda Blade, NP due to PCP Janyth Contes Janene Harvey, NP being out of office.

## 2022-08-27 NOTE — Telephone Encounter (Signed)
Patient called and request medication Hydrocodone be refilled. Patient last refill 07/30/2022. Patient has contract on file dated 03/30/2022. Patient medication pend and sent to PCP Janyth Contes Janene Harvey, NP for approval.

## 2022-09-06 ENCOUNTER — Other Ambulatory Visit: Payer: Self-pay | Admitting: Nurse Practitioner

## 2022-09-06 DIAGNOSIS — K219 Gastro-esophageal reflux disease without esophagitis: Secondary | ICD-10-CM

## 2022-09-24 ENCOUNTER — Telehealth: Payer: Self-pay

## 2022-09-24 NOTE — Telephone Encounter (Signed)
Patient called wanting to know if she can increase her buspirone to 3 or 4 times a day or if she can take cymbalta twice a day. Either or. She states that she is going through a really hard time right dealing with her son's illness and she really has nobody else.She states that she would not have a way to get to the office from [place].  Message sent to Abbey Chatters, NP

## 2022-09-24 NOTE — Telephone Encounter (Signed)
Can increase buspirone to three times daily to see if this helps with anxiety.

## 2022-09-27 ENCOUNTER — Other Ambulatory Visit: Payer: Self-pay | Admitting: *Deleted

## 2022-09-27 ENCOUNTER — Other Ambulatory Visit: Payer: Self-pay

## 2022-09-27 DIAGNOSIS — F419 Anxiety disorder, unspecified: Secondary | ICD-10-CM

## 2022-09-27 DIAGNOSIS — G8929 Other chronic pain: Secondary | ICD-10-CM

## 2022-09-27 MED ORDER — HYDROCODONE-ACETAMINOPHEN 10-325 MG PO TABS
ORAL_TABLET | ORAL | 0 refills | Status: DC
Start: 2022-09-27 — End: 2022-10-26

## 2022-09-27 MED ORDER — BUSPIRONE HCL 5 MG PO TABS: 5.0000 mg | ORAL_TABLET | Freq: Three times a day (TID) | ORAL | 4 refills | Status: DC

## 2022-09-27 NOTE — Telephone Encounter (Signed)
Patient notified and agreed. Patient needed updated prescription sent to pharmacy. Medication sent to pharmacy.

## 2022-09-27 NOTE — Telephone Encounter (Signed)
Patient called and requested refill.  Epic LR: 08/27/2022 Contract Date: 03/26/2022  Pended Rx and sent to [facility] for approval.

## 2022-10-21 ENCOUNTER — Other Ambulatory Visit: Payer: Self-pay | Admitting: Nurse Practitioner

## 2022-10-21 DIAGNOSIS — I10 Essential (primary) hypertension: Secondary | ICD-10-CM

## 2022-10-26 ENCOUNTER — Other Ambulatory Visit: Payer: Self-pay

## 2022-10-26 DIAGNOSIS — G8929 Other chronic pain: Secondary | ICD-10-CM

## 2022-10-26 MED ORDER — HYDROCODONE-ACETAMINOPHEN 10-325 MG PO TABS: ORAL_TABLET | ORAL | 0 refills | Status: DC

## 2022-10-26 NOTE — Telephone Encounter (Signed)
Patient called requesting refill on hydrocodone 10-325mg  tablet. Medication was last refilled 09/27/2022. Patient has updated treatment agreement on file.  Medication pended and sent to Abbey Chatters, NP

## 2022-11-08 ENCOUNTER — Encounter: Payer: Self-pay | Admitting: Nurse Practitioner

## 2022-11-08 ENCOUNTER — Ambulatory Visit (INDEPENDENT_AMBULATORY_CARE_PROVIDER_SITE_OTHER): Payer: 59 | Admitting: Nurse Practitioner

## 2022-11-08 VITALS — BP 130/70 | HR 73 | Temp 98.1°F | Resp 14 | Ht 62.0 in | Wt 166.0 lb

## 2022-11-08 DIAGNOSIS — I10 Essential (primary) hypertension: Secondary | ICD-10-CM

## 2022-11-08 DIAGNOSIS — F1120 Opioid dependence, uncomplicated: Secondary | ICD-10-CM

## 2022-11-08 DIAGNOSIS — G8929 Other chronic pain: Secondary | ICD-10-CM

## 2022-11-08 DIAGNOSIS — M1990 Unspecified osteoarthritis, unspecified site: Secondary | ICD-10-CM

## 2022-11-08 DIAGNOSIS — M545 Low back pain, unspecified: Secondary | ICD-10-CM

## 2022-11-08 DIAGNOSIS — E782 Mixed hyperlipidemia: Secondary | ICD-10-CM

## 2022-11-08 DIAGNOSIS — F419 Anxiety disorder, unspecified: Secondary | ICD-10-CM

## 2022-11-08 DIAGNOSIS — E1140 Type 2 diabetes mellitus with diabetic neuropathy, unspecified: Secondary | ICD-10-CM

## 2022-11-08 DIAGNOSIS — D509 Iron deficiency anemia, unspecified: Secondary | ICD-10-CM

## 2022-11-08 DIAGNOSIS — K219 Gastro-esophageal reflux disease without esophagitis: Secondary | ICD-10-CM | POA: Diagnosis not present

## 2022-11-08 DIAGNOSIS — E034 Atrophy of thyroid (acquired): Secondary | ICD-10-CM

## 2022-11-08 NOTE — Patient Instructions (Signed)
Sign record release for [facility] for last diabetic eye exam
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
## 2022-11-10 LAB — CBC WITH DIFFERENTIAL/PLATELET
Absolute Monocytes: 1264 {cells}/uL — ABNORMAL HIGH (ref 200–950)
Basophils Absolute: 71 {cells}/uL (ref 0–200)
Basophils Relative: 0.9 %
Eosinophils Absolute: 190 {cells}/uL (ref 15–500)
Eosinophils Relative: 2.4 %
HCT: 35.8 % (ref 35.0–45.0)
Hemoglobin: 11 g/dL — ABNORMAL LOW (ref 11.7–15.5)
Lymphs Abs: 2370 {cells}/uL (ref 850–3900)
MCH: 22.5 pg — ABNORMAL LOW (ref 27.0–33.0)
MCHC: 30.7 g/dL — ABNORMAL LOW (ref 32.0–36.0)
MCV: 73.4 fL — ABNORMAL LOW (ref 80.0–100.0)
MPV: 9.5 fL (ref 7.5–12.5)
Monocytes Relative: 16 %
Neutro Abs: 4005 {cells}/uL (ref 1500–7800)
Neutrophils Relative %: 50.7 %
Platelets: 332 10*3/uL (ref 140–400)
RBC: 4.88 10*6/uL (ref 3.80–5.10)
RDW: 15.7 % — ABNORMAL HIGH (ref 11.0–15.0)
Total Lymphocyte: 30 %
WBC: 7.9 10*3/uL (ref 3.8–10.8)

## 2022-11-10 LAB — COMPLETE METABOLIC PANEL WITH GFR
AG Ratio: 1.5 (calc) (ref 1.0–2.5)
ALT: 9 U/L (ref 6–29)
AST: 14 U/L (ref 10–35)
Albumin: 4 g/dL (ref 3.6–5.1)
Alkaline phosphatase (APISO): 72 U/L (ref 37–153)
BUN/Creatinine Ratio: 12 (calc) (ref 6–22)
BUN: 7 mg/dL (ref 7–25)
CO2: 35 mmol/L — ABNORMAL HIGH (ref 20–32)
Calcium: 9.2 mg/dL (ref 8.6–10.4)
Chloride: 89 mmol/L — ABNORMAL LOW (ref 98–110)
Creat: 0.58 mg/dL — ABNORMAL LOW (ref 0.60–0.95)
Globulin: 2.7 g/dL (ref 1.9–3.7)
Glucose, Bld: 90 mg/dL (ref 65–99)
Potassium: 3.9 mmol/L (ref 3.5–5.3)
Sodium: 131 mmol/L — ABNORMAL LOW (ref 135–146)
Total Bilirubin: 0.4 mg/dL (ref 0.2–1.2)
Total Protein: 6.7 g/dL (ref 6.1–8.1)
eGFR: 88 mL/min/{1.73_m2} (ref >=60)

## 2022-11-10 LAB — HEMOGLOBIN A1C
Hgb A1c MFr Bld: 5.9 %{Hb} — ABNORMAL HIGH (ref <5.7)
Mean Plasma Glucose: 123 mg/dL
eAG (mmol/L): 6.8 mmol/L

## 2022-11-11 ENCOUNTER — Other Ambulatory Visit: Payer: Self-pay | Admitting: Nurse Practitioner

## 2022-11-11 DIAGNOSIS — D509 Iron deficiency anemia, unspecified: Secondary | ICD-10-CM

## 2022-11-25 ENCOUNTER — Other Ambulatory Visit: Payer: Self-pay

## 2022-11-25 DIAGNOSIS — G8929 Other chronic pain: Secondary | ICD-10-CM

## 2022-11-25 MED ORDER — HYDROCODONE-ACETAMINOPHEN 10-325 MG PO TABS: ORAL_TABLET | ORAL | 0 refills | Status: DC

## 2022-11-25 NOTE — Telephone Encounter (Signed)
Patient is requesting a refill of the following medications: Requested Prescriptions   Pending Prescriptions Disp Refills   HYDROcodone-acetaminophen (NORCO) 10-325 MG tablet 90 tablet 0    Sig: Take one tablet by mouth every 8 hours as needed for pain    Date of last refill:10/26/2022  Refill amount: 90 tablets 0 refills   Treatment agreement date: 03/26/2022

## 2022-12-27 ENCOUNTER — Encounter: Payer: Self-pay | Admitting: Oncology

## 2022-12-27 ENCOUNTER — Inpatient Hospital Stay: Payer: 59 | Admitting: Oncology

## 2022-12-27 ENCOUNTER — Inpatient Hospital Stay: Payer: 59

## 2022-12-27 ENCOUNTER — Inpatient Hospital Stay: Payer: 59 | Attending: Oncology | Admitting: Oncology

## 2022-12-27 ENCOUNTER — Other Ambulatory Visit: Payer: Self-pay | Admitting: *Deleted

## 2022-12-27 VITALS — BP 112/79 | HR 67 | Temp 98.9°F | Resp 18

## 2022-12-27 DIAGNOSIS — F1091 Alcohol use, unspecified, in remission: Secondary | ICD-10-CM | POA: Insufficient documentation

## 2022-12-27 DIAGNOSIS — G8929 Other chronic pain: Secondary | ICD-10-CM

## 2022-12-27 DIAGNOSIS — Z87891 Personal history of nicotine dependence: Secondary | ICD-10-CM | POA: Diagnosis not present

## 2022-12-27 DIAGNOSIS — D509 Iron deficiency anemia, unspecified: Secondary | ICD-10-CM | POA: Insufficient documentation

## 2022-12-27 DIAGNOSIS — Z79899 Other long term (current) drug therapy: Secondary | ICD-10-CM | POA: Insufficient documentation

## 2022-12-27 MED ORDER — HYDROCODONE-ACETAMINOPHEN 10-325 MG PO TABS: ORAL_TABLET | ORAL | 0 refills | Status: DC

## 2022-12-27 NOTE — Patient Instructions (Signed)
VISIT SUMMARY:  Abigail Edwards, you were seen today to evaluate your low hemoglobin and iron levels. Your hemoglobin is slightly low at 11, but you are not experiencing any symptoms like fatigue or shortness of breath. We discussed your current medications and overall health, including your history of high blood pressure, chronic back pain, and anxiety/depression.  YOUR PLAN:  -IRON DEFICIENCY ANEMIA: Iron deficiency anemia means that your body does not have enough iron to produce the hemoglobin needed for red blood cells. We will change your oral iron supplementation to every other day to help with constipation. Additionally, we will schedule two doses of IV iron over the next two weeks, pending insurance approval. If needed, we will arrange transportation for you. Please return in one month for a follow-up visit and repeat labs on the same day.   INSTRUCTIONS:  Please return in one month for a follow-up visit and repeat labs on the same day.

## 2022-12-27 NOTE — Assessment & Plan Note (Signed)
Hemoglobin mildly decreased at 11, but iron levels significantly low. No symptoms of fatigue or shortness of breath. No evidence of GI bleeding. Patient currently on oral iron supplementation, but experiencing constipation. -Change oral iron supplementation to every other day. -Schedule two doses of IV iron. The most likely cause of her anemia is due to chronic blood loss.We discussed some of the risks, benefits, and alternatives of intravenous iron infusions. The patient is symptomatic from anemia and the iron level is critically low. She tolerated oral iron supplement poorly and desires to achieved higher levels of iron faster for adequate hematopoesis. Some of the side-effects to be expected including risks of infusion reactions, phlebitis, headaches, nausea and fatigue.  The patient is willing to proceed. Patient education material was dispensed. Goal is to keep ferritin level greater than 50 and resolution of anemia  -Arrange transportation for patient if needed. Patient will have to call her insurance. -Return visit in one month with repeat labs on the same day.

## 2022-12-27 NOTE — Progress Notes (Signed)
[facility] HEMATOLOGY NEW VISIT  Sharon Seller, NP  REASON FOR REFERRAL: Iron deficiency anemia  SUMMARY OF HEMATOLOGIC HISTORY:    Latest Ref Rng & Units 11/08/2022   11:52 AM 03/26/2022   11:56 AM 09/18/2021    3:35 PM  CBC  WBC 3.8 - 10.8 Thousand/uL 7.9  11.0  10.8   Hemoglobin 11.7 - 15.5 g/dL 09.8  11.9  14.7   Hematocrit 35.0 - 45.0 % 35.8  35.6  39.8   Platelets 140 - 400 Thousand/uL 332  301  310     Lab Results  Component Value Date   IRON 13 (L) 11/08/2022   TIBC 388 11/08/2022   FERRITIN 5 (L) 11/08/2022     HISTORY OF PRESENT ILLNESS: Abigail Edwards 88 y.o. female referred for iron deficiency anemia.  Shepresents for evaluation of low hemoglobin and iron levels. Her hemoglobin was found to be 11 and she reports no symptoms of fatigue or shortness of breath.She lives with her son in [place], who has his own health issues, and is able to perform her daily activities independently, including showering and moving around the house. She reports no current symptoms of dark or black stools, abdominal pain, weight loss, or loss of appetite. Patient lives with her son who has many medical needs as well. She had a colonoscopy in 2019 which showed a non bleeding cecal AVM. She does not wish to do any more colonoscopies. She is doing well overall.   She has a history of alcohol use but has not consumed alcohol in years and has a history of smoking but quit many years ago. She has no family history of colon cancer.  I have reviewed the past medical history, past surgical history, social history and family history with the patient   ALLERGIES:  is allergic to valium [diazepam], morphine and codeine, and penicillins.  MEDICATIONS:  Current Outpatient Medications  Medication Sig Dispense Refill   atorvastatin (LIPITOR) 20 MG tablet TAKE 1 TABLET BY MOUTH ONCE  DAILY 100 tablet 2   busPIRone (BUSPAR) 5 MG tablet Take 1 tablet (5 mg total)  by mouth 3 (three) times daily. 90 tablet 4   diclofenac Sodium (VOLTAREN) 1 % GEL APPLY 4 GRAMS TOPICALLY TO THE AFFECTED AREA FOUR TIMES DAILY 200 g 11   DULoxetine (CYMBALTA) 60 MG capsule TAKE 1 CAPSULE(60 MG) BY MOUTH DAILY 90 capsule 3   gabapentin (NEURONTIN) 400 MG capsule Take 400 mg by mouth 3 (three) times daily.     HYDROcodone-acetaminophen (NORCO) 10-325 MG tablet Take one tablet by mouth every 8 hours as needed for pain 90 tablet 0   Iron-FA-B Cmp-C-Biot-Probiotic (FUSION PLUS) CAPS 1 tablet daily with food. 30 capsule 3   levothyroxine (SYNTHROID) 125 MCG tablet TAKE 1 TABLET BY MOUTH ONCE  DAILY 100 tablet 2   OXYGEN Inhale 3 L into the lungs at bedtime. Has sleep apnea and uses 3 liters at bedtime     pantoprazole (PROTONIX) 40 MG tablet TAKE 1 TABLET(40 MG) BY MOUTH DAILY 90 tablet 3   potassium chloride (KLOR-CON) 10 MEQ tablet TAKE 1 TABLET(10 MEQ) BY MOUTH TWICE DAILY 180 tablet 3   Saccharomyces boulardii (PROBIOTIC) 250 MG CAPS 1 tablet daily 60 capsule 11   triamterene-hydrochlorothiazide (MAXZIDE-25) 37.5-25 MG tablet TAKE 1 TABLET BY MOUTH DAILY 100 tablet 2   UNABLE TO FIND Blood pressure cuff Dx: HTN I10- to take blood pressure three times weekly 1 Device  0   No current facility-administered medications for this visit.     REVIEW OF SYSTEMS:   Constitutional: Denies fevers, chills or night sweats Eyes: Denies blurriness of vision Ears, nose, mouth, throat, and face: Denies mucositis or sore throat Respiratory: Denies cough, dyspnea or wheezes Cardiovascular: Denies palpitation, chest discomfort or lower extremity swelling Gastrointestinal:  Denies nausea, heartburn or change in bowel habits Skin: Denies abnormal skin rashes Lymphatics: Denies new lymphadenopathy or easy bruising Neurological:Denies numbness, tingling or new weaknesses Behavioral/Psych: Mood is stable, no new changes  All other systems were reviewed with the patient and are  negative.  PHYSICAL EXAMINATION:   Vitals:   12/27/22 1258  BP: 112/79  Pulse: 67  Resp: 18  Temp: 98.9 F (37.2 C)  SpO2: 94%    GENERAL:alert, no distress and comfortable, patient in a wheel chair SKIN: skin color, texture, turgor are normal, no rashes or significant lesions LUNGS: clear to auscultation and percussion with normal breathing effort HEART: regular rate & rhythm and no murmurs and no lower extremity edema ABDOMEN:abdomen soft, non-tender and normal bowel sounds Musculoskeletal:no cyanosis of digits and no clubbing  NEURO: alert & oriented x 3 with fluent speech  LABORATORY DATA:  I have reviewed the data as listed  Lab Results  Component Value Date   WBC 7.9 11/08/2022   NEUTROABS 4,005 11/08/2022   HGB 11.0 (L) 11/08/2022   HCT 35.8 11/08/2022   MCV 73.4 (L) 11/08/2022   PLT 332 11/08/2022      Component Value Date/Time   NA 131 (L) 11/08/2022 1152   NA 130 (L) 08/08/2015 1035   K 3.9 11/08/2022 1152   CL 89 (L) 11/08/2022 1152   CO2 35 (H) 11/08/2022 1152   GLUCOSE 90 11/08/2022 1152   BUN 7 11/08/2022 1152   BUN 12 08/08/2015 1035   CREATININE 0.58 (L) 11/08/2022 1152   CALCIUM 9.2 11/08/2022 1152   PROT 6.7 11/08/2022 1152   PROT 6.5 08/01/2015 1454   ALBUMIN 4.1 02/04/2016 1403   ALBUMIN 3.9 08/01/2015 1454   AST 14 11/08/2022 1152   ALT 9 11/08/2022 1152   ALKPHOS 71 02/04/2016 1403   BILITOT 0.4 11/08/2022 1152   BILITOT 0.2 08/01/2015 1454   GFRNONAA 84 08/22/2020 1030   GFRAA 97 08/22/2020 1030      Chemistry      Component Value Date/Time   NA 131 (L) 11/08/2022 1152   NA 130 (L) 08/08/2015 1035   K 3.9 11/08/2022 1152   CL 89 (L) 11/08/2022 1152   CO2 35 (H) 11/08/2022 1152   BUN 7 11/08/2022 1152   BUN 12 08/08/2015 1035   CREATININE 0.58 (L) 11/08/2022 1152      Component Value Date/Time   CALCIUM 9.2 11/08/2022 1152   ALKPHOS 71 02/04/2016 1403   AST 14 11/08/2022 1152   ALT 9 11/08/2022 1152   BILITOT 0.4  11/08/2022 1152   BILITOT 0.2 08/01/2015 1454     Lab Results  Component Value Date   IRON 13 (L) 11/08/2022   TIBC 388 11/08/2022   FERRITIN 5 (L) 11/08/2022     ASSESSMENT & PLAN:  Patient is a 88yo female presenting for iron deficiency anemia.  Iron deficiency anemia Hemoglobin mildly decreased at 11, but iron levels significantly low. No symptoms of fatigue or shortness of breath. No evidence of GI bleeding. Patient currently on oral iron supplementation, but experiencing constipation. -Change oral iron supplementation to every other day. -Schedule two doses of IV  iron. The most likely cause of her anemia is due to chronic blood loss.We discussed some of the risks, benefits, and alternatives of intravenous iron infusions. The patient is symptomatic from anemia and the iron level is critically low. She tolerated oral iron supplement poorly and desires to achieved higher levels of iron faster for adequate hematopoesis. Some of the side-effects to be expected including risks of infusion reactions, phlebitis, headaches, nausea and fatigue.  The patient is willing to proceed. Patient education material was dispensed. Goal is to keep ferritin level greater than 50 and resolution of anemia  -Arrange transportation for patient if needed. Patient will have to call her insurance. -Return visit in one month with repeat labs on the same day.   Orders Placed This Encounter  Procedures   Ferritin    Standing Status:   Future    Standing Expiration Date:   12/27/2023   CBC with Differential/Platelet    Standing Status:   Future    Standing Expiration Date:   12/27/2023   Comprehensive metabolic panel    Standing Status:   Future    Standing Expiration Date:   12/27/2023   Iron and TIBC    Standing Status:   Future    Standing Expiration Date:   12/27/2023    The total time spent in the appointment was 30 minutes encounter with patients including review of chart and various tests results,  discussions about plan of care and coordination of care plan   All questions were answered. The patient knows to call the clinic with any problems, questions or concerns. No barriers to learning was detected.   Cindie Crumbly, MD 11/4/20242:06 PM

## 2022-12-27 NOTE — Telephone Encounter (Signed)
Patient requested refill.  Epic LR: 11/25/2022 Contract Date: 03/26/2022  Pended Rx and sent to [facility] for approval.

## 2023-01-06 ENCOUNTER — Inpatient Hospital Stay: Payer: 59

## 2023-01-06 VITALS — BP 135/49 | HR 68 | Temp 97.8°F | Resp 18

## 2023-01-06 DIAGNOSIS — D509 Iron deficiency anemia, unspecified: Secondary | ICD-10-CM

## 2023-01-06 DIAGNOSIS — Z95828 Presence of other vascular implants and grafts: Secondary | ICD-10-CM

## 2023-01-06 MED ORDER — SODIUM CHLORIDE 0.9 % IV SOLN: INTRAVENOUS | Status: DC

## 2023-01-06 MED ORDER — SODIUM CHLORIDE 0.9% FLUSH: 10.0000 mL | Freq: Two times a day (BID) | INTRAVENOUS | Status: DC

## 2023-01-06 MED ORDER — ACETAMINOPHEN 325 MG PO TABS
650.0000 mg | ORAL_TABLET | Freq: Once | ORAL | Status: AC
  Administered 2023-01-06: 650 mg via ORAL
  Filled 2023-01-06: qty 2

## 2023-01-06 MED ORDER — SODIUM CHLORIDE 0.9 % IV SOLN
510.0000 mg | Freq: Once | INTRAVENOUS | Status: AC
  Administered 2023-01-06: 510 mg via INTRAVENOUS
  Filled 2023-01-06: qty 510

## 2023-01-06 MED ORDER — CETIRIZINE HCL 10 MG PO TABS
10.0000 mg | ORAL_TABLET | Freq: Once | ORAL | Status: AC
  Administered 2023-01-06: 10 mg via ORAL
  Filled 2023-01-06: qty 1

## 2023-01-06 NOTE — Patient Instructions (Signed)
[facility] - A DEPT OF [facility]  Discharge Instructions: Thank you for choosing [facility] to provide your oncology and hematology care.  If you have a lab appointment with the Cancer Center - please note that after April 8th, 2024, all labs will be drawn in the cancer center.  You do not have to check in or register with the main entrance as you have in the past but will complete your check-in in the cancer center.  Wear comfortable clothing and clothing appropriate for easy access to any Portacath or PICC line.   We strive to give you quality time with your provider. You may need to reschedule your appointment if you arrive late (15 or more minutes).  Arriving late affects you and other patients whose appointments are after yours.  Also, if you miss three or more appointments without notifying the office, you may be dismissed from the clinic at the provider's discretion.      For prescription refill requests, have your pharmacy contact our office and allow 72 hours for refills to be completed.    Today you received the following Venofer, return as scheduled.   To help prevent nausea and vomiting after your treatment, we encourage you to take your nausea medication as directed.  BELOW ARE SYMPTOMS THAT SHOULD BE REPORTED IMMEDIATELY: *FEVER GREATER THAN 100.4 F (38 C) OR HIGHER *CHILLS OR SWEATING *NAUSEA AND VOMITING THAT IS NOT CONTROLLED WITH YOUR NAUSEA MEDICATION *UNUSUAL SHORTNESS OF BREATH *UNUSUAL BRUISING OR BLEEDING *URINARY PROBLEMS (pain or burning when urinating, or frequent urination) *BOWEL PROBLEMS (unusual diarrhea, constipation, pain near the anus) TENDERNESS IN MOUTH AND THROAT WITH OR WITHOUT PRESENCE OF ULCERS (sore throat, sores in mouth, or a toothache) UNUSUAL RASH, SWELLING OR PAIN  UNUSUAL VAGINAL DISCHARGE OR ITCHING   Items with * indicate a potential emergency and should be followed up as soon as possible or  go to the Emergency Department if any problems should occur.  Please show the CHEMOTHERAPY ALERT CARD or IMMUNOTHERAPY ALERT CARD at check-in to the Emergency Department and triage nurse.  Should you have questions after your visit or need to cancel or reschedule your appointment, please contact [facility] - A DEPT OF [facility] [phone]  and follow the prompts.  Office hours are 8:00 a.m. to 4:30 p.m. Monday - Friday. Please note that voicemails left after 4:00 p.m. may not be returned until the following business day.  We are closed weekends and major holidays. You have access to a nurse at all times for urgent questions. Please call the main number to the clinic [phone] and follow the prompts.  For any non-urgent questions, you may also contact your provider using MyChart. We now offer e-Visits for anyone 60 and older to request care online for non-urgent symptoms. For details visit [email or web address].   Also download the MyChart app! Go to the app store, search "MyChart", open the app, select [facility], and log in with your MyChart username and password.

## 2023-01-06 NOTE — Progress Notes (Signed)
Patient tolerated iron infusion with no complaints voiced.  Peripheral IV site clean and dry with good blood return noted before and after infusion.  Band aid applied.  VSS with discharge and left in satisfactory condition with no s/s of distress noted.   

## 2023-01-13 ENCOUNTER — Inpatient Hospital Stay: Payer: 59

## 2023-01-13 VITALS — BP 138/57 | HR 71 | Temp 97.6°F | Resp 18

## 2023-01-13 DIAGNOSIS — D509 Iron deficiency anemia, unspecified: Secondary | ICD-10-CM

## 2023-01-13 MED ORDER — ACETAMINOPHEN 325 MG PO TABS
650.0000 mg | ORAL_TABLET | Freq: Once | ORAL | Status: AC
  Administered 2023-01-13: 650 mg via ORAL
  Filled 2023-01-13: qty 2

## 2023-01-13 MED ORDER — CETIRIZINE HCL 10 MG PO TABS
10.0000 mg | ORAL_TABLET | Freq: Once | ORAL | Status: AC
  Administered 2023-01-13: 10 mg via ORAL
  Filled 2023-01-13: qty 1

## 2023-01-13 MED ORDER — SODIUM CHLORIDE 0.9 % IV SOLN: INTRAVENOUS | Status: DC

## 2023-01-13 MED ORDER — SODIUM CHLORIDE 0.9 % IV SOLN
510.0000 mg | Freq: Once | INTRAVENOUS | Status: AC
  Administered 2023-01-13: 510 mg via INTRAVENOUS
  Filled 2023-01-13: qty 510

## 2023-01-13 MED ORDER — SODIUM CHLORIDE 0.9% FLUSH: 10.0000 mL | Freq: Two times a day (BID) | INTRAVENOUS | Status: DC

## 2023-01-13 NOTE — Patient Instructions (Signed)
 [facility] - A DEPT OF [facility]  Discharge Instructions: Thank you for choosing [facility] to provide your oncology and hematology care.  If you have a lab appointment with the Cancer Center - please note that after April 8th, 2024, all labs will be drawn in the cancer center.  You do not have to check in or register with the main entrance as you have in the past but will complete your check-in in the cancer center.  Wear comfortable clothing and clothing appropriate for easy access to any Portacath or PICC line.   We strive to give you quality time with your provider. You may need to reschedule your appointment if you arrive late (15 or more minutes).  Arriving late affects you and other patients whose appointments are after yours.  Also, if you miss three or more appointments without notifying the office, you may be dismissed from the clinic at the provider's discretion.      For prescription refill requests, have your pharmacy contact our office and allow 72 hours for refills to be completed.    Today you received the following [facility], return as scheduled.   To help prevent nausea and vomiting after your treatment, we encourage you to take your nausea medication as directed.  BELOW ARE SYMPTOMS THAT SHOULD BE REPORTED IMMEDIATELY: *FEVER GREATER THAN 100.4 F (38 C) OR HIGHER *CHILLS OR SWEATING *NAUSEA AND VOMITING THAT IS NOT CONTROLLED WITH YOUR NAUSEA MEDICATION *UNUSUAL SHORTNESS OF BREATH *UNUSUAL BRUISING OR BLEEDING *URINARY PROBLEMS (pain or burning when urinating, or frequent urination) *BOWEL PROBLEMS (unusual diarrhea, constipation, pain near the anus) TENDERNESS IN MOUTH AND THROAT WITH OR WITHOUT PRESENCE OF ULCERS (sore throat, sores in mouth, or a toothache) UNUSUAL RASH, SWELLING OR PAIN  UNUSUAL VAGINAL DISCHARGE OR ITCHING   Items with * indicate a potential emergency and should be followed up as soon as possible  or go to the Emergency Department if any problems should occur.  Please show the CHEMOTHERAPY ALERT CARD or IMMUNOTHERAPY ALERT CARD at check-in to the Emergency Department and triage nurse.  Should you have questions after your visit or need to cancel or reschedule your appointment, please contact [facility] - A DEPT OF Eligha Bridegroom [facility] [phone]  and follow the prompts.  Office hours are 8:00 a.m. to 4:30 p.m. Monday - Friday. Please note that voicemails left after 4:00 p.m. may not be returned until the following business day.  We are closed weekends and major holidays. You have access to a nurse at all times for urgent questions. Please call the main number to the clinic [phone] and follow the prompts.  For any non-urgent questions, you may also contact your provider using MyChart. We now offer e-Visits for anyone 42 and older to request care online for non-urgent symptoms. For details visit [email or web address].   Also download the MyChart app! Go to the app store, search "MyChart", open the app, select [facility], and log in with your MyChart username and password.

## 2023-01-13 NOTE — Progress Notes (Signed)
Patient tolerated iron infusion with no complaints voiced.  Peripheral IV site clean and dry with good blood return noted before and after infusion.  Band aid applied.  VSS with discharge and left in satisfactory condition with no s/s of distress noted.   

## 2023-01-14 ENCOUNTER — Other Ambulatory Visit: Payer: Self-pay

## 2023-01-14 DIAGNOSIS — F419 Anxiety disorder, unspecified: Secondary | ICD-10-CM

## 2023-01-14 DIAGNOSIS — G8929 Other chronic pain: Secondary | ICD-10-CM

## 2023-01-14 NOTE — Telephone Encounter (Signed)
Patient called requesting refill on Buspar 5 mg and Norco 10-325 mg stated that pharmacy told her to reach out to her PCP.

## 2023-01-17 MED ORDER — BUSPIRONE HCL 5 MG PO TABS: 5.0000 mg | ORAL_TABLET | Freq: Three times a day (TID) | ORAL | 4 refills | Status: DC

## 2023-01-26 ENCOUNTER — Other Ambulatory Visit: Payer: Self-pay

## 2023-01-26 DIAGNOSIS — G8929 Other chronic pain: Secondary | ICD-10-CM

## 2023-01-26 MED ORDER — HYDROCODONE-ACETAMINOPHEN 10-325 MG PO TABS: ORAL_TABLET | ORAL | 0 refills | Status: DC

## 2023-01-26 NOTE — Telephone Encounter (Signed)
Patient is requesting a refill of the following medications: Requested Prescriptions   Pending Prescriptions Disp Refills   HYDROcodone-acetaminophen (NORCO) 10-325 MG tablet 90 tablet 0    Sig: Take one tablet by mouth every 8 hours as needed for pain    Date of last refill: 12/27/2022  Refill amount: 90  Treatment agreement date: yes, 03/26/2022

## 2023-01-28 ENCOUNTER — Inpatient Hospital Stay: Payer: 59 | Attending: Oncology

## 2023-01-28 ENCOUNTER — Inpatient Hospital Stay (HOSPITAL_BASED_OUTPATIENT_CLINIC_OR_DEPARTMENT_OTHER): Payer: 59 | Admitting: Oncology

## 2023-01-28 VITALS — BP 136/58 | HR 69 | Temp 98.0°F | Resp 18 | Wt 169.2 lb

## 2023-01-28 DIAGNOSIS — D509 Iron deficiency anemia, unspecified: Secondary | ICD-10-CM

## 2023-01-28 DIAGNOSIS — E871 Hypo-osmolality and hyponatremia: Secondary | ICD-10-CM | POA: Insufficient documentation

## 2023-01-28 LAB — COMPREHENSIVE METABOLIC PANEL
ALT: 13 U/L (ref 0–44)
AST: 17 U/L (ref 15–41)
Albumin: 3.6 g/dL (ref 3.5–5.0)
Alkaline Phosphatase: 66 U/L (ref 38–126)
Anion gap: 10 (ref 5–15)
BUN: 13 mg/dL (ref 8–23)
CO2: 30 mmol/L (ref 22–32)
Calcium: 8.7 mg/dL — ABNORMAL LOW (ref 8.9–10.3)
Chloride: 87 mmol/L — ABNORMAL LOW (ref 98–111)
Creatinine, Ser: 0.56 mg/dL (ref 0.44–1.00)
GFR, Estimated: >60 mL/min (ref >60)
Glucose, Bld: 94 mg/dL (ref 70–99)
Potassium: 3.7 mmol/L (ref 3.5–5.1)
Sodium: 127 mmol/L — ABNORMAL LOW (ref 135–145)
Total Bilirubin: 0.4 mg/dL (ref <1.2)
Total Protein: 6.6 g/dL (ref 6.5–8.1)

## 2023-01-28 LAB — CBC WITH DIFFERENTIAL/PLATELET
Abs Immature Granulocytes: 0.05 10*3/uL (ref 0.00–0.07)
Basophils Absolute: 0.1 10*3/uL (ref 0.0–0.1)
Basophils Relative: 1 %
Eosinophils Absolute: 0.3 10*3/uL (ref 0.0–0.5)
Eosinophils Relative: 3 %
HCT: 39.8 % (ref 36.0–46.0)
Hemoglobin: 12.9 g/dL (ref 12.0–15.0)
Immature Granulocytes: 1 %
Lymphocytes Relative: 22 %
Lymphs Abs: 2.2 10*3/uL (ref 0.7–4.0)
MCH: 26.6 pg (ref 26.0–34.0)
MCHC: 32.4 g/dL (ref 30.0–36.0)
MCV: 82.1 fL (ref 80.0–100.0)
Monocytes Absolute: 1.8 10*3/uL — ABNORMAL HIGH (ref 0.1–1.0)
Monocytes Relative: 18 %
Neutro Abs: 5.4 10*3/uL (ref 1.7–7.7)
Neutrophils Relative %: 55 %
Platelets: 269 10*3/uL (ref 150–400)
RBC: 4.85 MIL/uL (ref 3.87–5.11)
RDW: 25.9 % — ABNORMAL HIGH (ref 11.5–15.5)
WBC: 9.9 10*3/uL (ref 4.0–10.5)
nRBC: 0 % (ref 0.0–0.2)

## 2023-01-28 LAB — IRON AND TIBC
Iron: 68 ug/dL (ref 28–170)
Saturation Ratios: 23 % (ref 10.4–31.8)
TIBC: 302 ug/dL (ref 250–450)
UIBC: 234 ug/dL

## 2023-01-28 LAB — FERRITIN: Ferritin: 114 ng/mL (ref 11–307)

## 2023-01-28 NOTE — Progress Notes (Signed)
[facility] at [facility] HEMATOLOGY NEW VISIT  Sharon Seller, NP  REASON FOR REFERRAL: Iron deficiency anemia  SUMMARY OF HEMATOLOGIC HISTORY:    Latest Ref Rng & Units 01/28/2023    9:33 AM 11/08/2022   11:52 AM 03/26/2022   11:56 AM  CBC  WBC 4.0 - 10.5 K/uL 9.9  7.9  11.0   Hemoglobin 12.0 - 15.0 g/dL 96.0  45.4  09.8   Hematocrit 36.0 - 46.0 % 39.8  35.8  35.6   Platelets 150 - 400 K/uL 269  332  301     Lab Results  Component Value Date   IRON 68 01/28/2023   TIBC 302 01/28/2023   FERRITIN 114 01/28/2023    HISTORY OF PRESENT ILLNESS: Abigail Edwards 88 y.o. female referred for iron deficiency anemia.  She was last seen in clinic by Dr. Joannie Springs on 12/27/2022.  Since her last visit she received 2 doses of IV Feraheme 510 mg on 01/06/2023 and 01/13/2023 with good tolerance.  She reports increased energy following her infusions.  Denies any interval hospitalizations, surgeries or changes to her baseline health.  Appetite is 100% and energy levels are 75%.  Has persistent back and hip pain but this is chronic for her.  Has chronic numbness and tingling in bilateral lower extremities.  She denies any episodes of bleeding, bright red blood per rectum, hematochezia or melena.  Has chronic right knee pain due to severe arthritis and uses a walker/cane or wheelchair for ambulation.  I have reviewed the past medical history, past surgical history, social history and family history with the patient   ALLERGIES:  is allergic to valium [diazepam], morphine and codeine, and penicillins.  MEDICATIONS:  Current Outpatient Medications  Medication Sig Dispense Refill   atorvastatin (LIPITOR) 20 MG tablet TAKE 1 TABLET BY MOUTH ONCE  DAILY 100 tablet 2   busPIRone (BUSPAR) 5 MG tablet Take 1 tablet (5 mg total) by mouth 3 (three) times daily. 90 tablet 4   diclofenac Sodium (VOLTAREN) 1 % GEL APPLY 4 GRAMS TOPICALLY TO THE AFFECTED AREA FOUR TIMES DAILY 200 g 11    DULoxetine (CYMBALTA) 60 MG capsule TAKE 1 CAPSULE(60 MG) BY MOUTH DAILY 90 capsule 3   gabapentin (NEURONTIN) 400 MG capsule Take 400 mg by mouth 3 (three) times daily.     HYDROcodone-acetaminophen (NORCO) 10-325 MG tablet Take one tablet by mouth every 8 hours as needed for pain 90 tablet 0   Iron-FA-B Cmp-C-Biot-Probiotic (FUSION PLUS) CAPS 1 tablet daily with food. 30 capsule 3   ketoconazole (NIZORAL) 2 % cream APPLY TO AFFECTED AREA TWICE DAILY BETWEEN TOES ON RIGHT FOOT     levothyroxine (SYNTHROID) 125 MCG tablet TAKE 1 TABLET BY MOUTH ONCE  DAILY 100 tablet 2   OXYGEN Inhale 3 L into the lungs at bedtime. Has sleep apnea and uses 3 liters at bedtime     pantoprazole (PROTONIX) 40 MG tablet TAKE 1 TABLET(40 MG) BY MOUTH DAILY 90 tablet 3   potassium chloride (KLOR-CON) 10 MEQ tablet TAKE 1 TABLET(10 MEQ) BY MOUTH TWICE DAILY 180 tablet 3   Saccharomyces boulardii (PROBIOTIC) 250 MG CAPS 1 tablet daily 60 capsule 11   triamterene-hydrochlorothiazide (MAXZIDE-25) 37.5-25 MG tablet TAKE 1 TABLET BY MOUTH DAILY 100 tablet 2   UNABLE TO FIND Blood pressure cuff Dx: HTN I10- to take blood pressure three times weekly 1 Device 0   No current facility-administered medications for this visit.  REVIEW OF SYSTEMS:   Review of Systems  Musculoskeletal:  Positive for joint pain (back/hips/knee).  Neurological:  Positive for tingling and sensory change.     PHYSICAL EXAMINATION:   Vitals:   01/28/23 1024  BP: (!) 136/58  Pulse: 69  Resp: 18  Temp: 98 F (36.7 C)  SpO2: 95%    Physical Exam Constitutional:      Appearance: Normal appearance.  Cardiovascular:     Rate and Rhythm: Normal rate and regular rhythm.  Pulmonary:     Effort: Pulmonary effort is normal.     Breath sounds: Normal breath sounds.  Abdominal:     General: Bowel sounds are normal.     Palpations: Abdomen is soft.  Musculoskeletal:        General: No swelling. Normal range of motion.  Neurological:      Mental Status: She is alert and oriented to person, place, and time. Mental status is at baseline.     LABORATORY DATA:  I have reviewed the data as listed  Lab Results  Component Value Date   WBC 9.9 01/28/2023   NEUTROABS 5.4 01/28/2023   HGB 12.9 01/28/2023   HCT 39.8 01/28/2023   MCV 82.1 01/28/2023   PLT 269 01/28/2023      Component Value Date/Time   NA 127 (L) 01/28/2023 0933   NA 130 (L) 08/08/2015 1035   K 3.7 01/28/2023 0933   CL 87 (L) 01/28/2023 0933   CO2 30 01/28/2023 0933   GLUCOSE 94 01/28/2023 0933   BUN 13 01/28/2023 0933   BUN 12 08/08/2015 1035   CREATININE 0.56 01/28/2023 0933   CREATININE 0.58 (L) 11/08/2022 1152   CALCIUM 8.7 (L) 01/28/2023 0933   PROT 6.6 01/28/2023 0933   PROT 6.5 08/01/2015 1454   ALBUMIN 3.6 01/28/2023 0933   ALBUMIN 3.9 08/01/2015 1454   AST 17 01/28/2023 0933   ALT 13 01/28/2023 0933   ALKPHOS 66 01/28/2023 0933   BILITOT 0.4 01/28/2023 0933   BILITOT 0.2 08/01/2015 1454   GFRNONAA >60 01/28/2023 0933   GFRNONAA 84 08/22/2020 1030   GFRAA 97 08/22/2020 1030      Chemistry      Component Value Date/Time   NA 127 (L) 01/28/2023 0933   NA 130 (L) 08/08/2015 1035   K 3.7 01/28/2023 0933   CL 87 (L) 01/28/2023 0933   CO2 30 01/28/2023 0933   BUN 13 01/28/2023 0933   BUN 12 08/08/2015 1035   CREATININE 0.56 01/28/2023 0933   CREATININE 0.58 (L) 11/08/2022 1152      Component Value Date/Time   CALCIUM 8.7 (L) 01/28/2023 0933   ALKPHOS 66 01/28/2023 0933   AST 17 01/28/2023 0933   ALT 13 01/28/2023 0933   BILITOT 0.4 01/28/2023 0933   BILITOT 0.2 08/01/2015 1454     Lab Results  Component Value Date   IRON 68 01/28/2023   TIBC 302 01/28/2023   FERRITIN 114 01/28/2023     ASSESSMENT & PLAN:  Patient is a 88yo female presenting for iron deficiency anemia.  No problem-specific Assessment & Plan notes found for this encounter.  1. Iron deficiency anemia, unspecified iron deficiency anemia type -She is  status post 2 doses of IV Feraheme 510 mg on 01/06/2023 and 01/16/2023. -Repeat labs from 01/28/2023 show hemoglobin 12.9 (11.0) with otherwise normal differential.  Ferritin 114, iron saturations 23% with normal TIBC. -No additional IV iron needed at this time. -Continue oral iron as you are able to  tolerate. -No GI bleeding per patient. -Recommend follow-up in 2 months with labs the same day.  We discussed that if she starts to feel poorly, tired, short of breath or notices any bleeding to return to clinic sooner.  2.  Hyponatremia: -We discussed increasing salt in her diet.  Sodium level is 127 today.  Normal is 135.  Encouraged her to drink propels/Gatorade's over the next couple of weeks.  If able, would recommend lab work from PCP.        PLAN SUMMARY: >> No additional IV iron needed. >> Return to clinic in 2 months for labs same day and see provider. >> Continue oral iron supplements.     I spent 20 minutes dedicated to the care of this patient (face-to-face and non-face-to-face) on the date of the encounter to include what is described in the assessment and plan.  No orders of the defined types were placed in this encounter.    All questions were answered. The patient knows to call the clinic with any problems, questions or concerns. No barriers to learning was detected.   Mauro Kaufmann, NP 12/6/202410:35 AM

## 2023-02-10 ENCOUNTER — Telehealth: Payer: Self-pay

## 2023-02-10 DIAGNOSIS — F419 Anxiety disorder, unspecified: Secondary | ICD-10-CM

## 2023-02-10 MED ORDER — BUSPIRONE HCL 5 MG PO TABS: 5.0000 mg | ORAL_TABLET | Freq: Three times a day (TID) | ORAL | 1 refills | Status: DC

## 2023-02-10 NOTE — Telephone Encounter (Signed)
Patient called into the office to see if she can start getting the Buspirone 5mg  in 90 day supply because it is harder to get someone to go pick up the prescription.

## 2023-02-10 NOTE — Telephone Encounter (Signed)
Attempted to call patient to let her know medication was sent. LVM

## 2023-02-10 NOTE — Telephone Encounter (Signed)
Yes, new Rx sent to pharmacy 

## 2023-02-14 ENCOUNTER — Other Ambulatory Visit: Payer: Self-pay | Admitting: Nurse Practitioner

## 2023-02-14 DIAGNOSIS — F32A Depression, unspecified: Secondary | ICD-10-CM

## 2023-02-14 DIAGNOSIS — G8929 Other chronic pain: Secondary | ICD-10-CM

## 2023-02-20 ENCOUNTER — Other Ambulatory Visit: Payer: Self-pay | Admitting: Family

## 2023-02-20 DIAGNOSIS — R252 Cramp and spasm: Secondary | ICD-10-CM

## 2023-02-20 DIAGNOSIS — I10 Essential (primary) hypertension: Secondary | ICD-10-CM

## 2023-02-24 ENCOUNTER — Other Ambulatory Visit: Payer: Self-pay

## 2023-02-24 DIAGNOSIS — G8929 Other chronic pain: Secondary | ICD-10-CM

## 2023-02-24 MED ORDER — HYDROCODONE-ACETAMINOPHEN 10-325 MG PO TABS: ORAL_TABLET | ORAL | 0 refills | Status: DC

## 2023-02-24 NOTE — Telephone Encounter (Signed)
 Patient is requesting a refill of the following medications: Requested Prescriptions   Pending Prescriptions Disp Refills   HYDROcodone -acetaminophen  (NORCO) 10-325 MG tablet 90 tablet 0    Sig: Take one tablet by mouth every 8 hours as needed for pain    Date of last refill:01/26/2023  Refill amount: 90 tablets   Treatment agreement date: 03/26/2022

## 2023-03-25 ENCOUNTER — Other Ambulatory Visit: Payer: Self-pay

## 2023-03-25 DIAGNOSIS — G8929 Other chronic pain: Secondary | ICD-10-CM

## 2023-03-25 MED ORDER — HYDROCODONE-ACETAMINOPHEN 10-325 MG PO TABS: ORAL_TABLET | ORAL | 0 refills | Status: DC

## 2023-03-25 NOTE — Telephone Encounter (Signed)
Patient is requesting a refill of the following medications: Requested Prescriptions   Pending Prescriptions Disp Refills   HYDROcodone-acetaminophen (NORCO) 10-325 MG tablet 90 tablet 0    Sig: Take one tablet by mouth every 8 hours as needed for pain    Date of last refill:02/24/2023  Refill amount: 90 tablets  Treatment agreement date: 03/26/2022

## 2023-03-31 ENCOUNTER — Other Ambulatory Visit: Payer: Self-pay | Admitting: Nurse Practitioner

## 2023-03-31 DIAGNOSIS — E782 Mixed hyperlipidemia: Secondary | ICD-10-CM

## 2023-04-01 ENCOUNTER — Other Ambulatory Visit: Payer: 59

## 2023-04-01 ENCOUNTER — Inpatient Hospital Stay: Payer: 59 | Admitting: Oncology

## 2023-04-07 ENCOUNTER — Other Ambulatory Visit: Payer: Self-pay

## 2023-04-07 DIAGNOSIS — E871 Hypo-osmolality and hyponatremia: Secondary | ICD-10-CM

## 2023-04-07 DIAGNOSIS — D509 Iron deficiency anemia, unspecified: Secondary | ICD-10-CM

## 2023-04-08 ENCOUNTER — Inpatient Hospital Stay: Payer: 59 | Attending: Oncology

## 2023-04-08 ENCOUNTER — Other Ambulatory Visit: Payer: Self-pay

## 2023-04-08 ENCOUNTER — Inpatient Hospital Stay (HOSPITAL_BASED_OUTPATIENT_CLINIC_OR_DEPARTMENT_OTHER): Payer: 59 | Admitting: Oncology

## 2023-04-08 VITALS — BP 138/70 | HR 78 | Temp 97.9°F | Resp 18

## 2023-04-08 DIAGNOSIS — N390 Urinary tract infection, site not specified: Secondary | ICD-10-CM | POA: Diagnosis not present

## 2023-04-08 DIAGNOSIS — D72829 Elevated white blood cell count, unspecified: Secondary | ICD-10-CM | POA: Insufficient documentation

## 2023-04-08 DIAGNOSIS — E871 Hypo-osmolality and hyponatremia: Secondary | ICD-10-CM

## 2023-04-08 DIAGNOSIS — D509 Iron deficiency anemia, unspecified: Secondary | ICD-10-CM

## 2023-04-08 DIAGNOSIS — T7491XA Unspecified adult maltreatment, confirmed, initial encounter: Secondary | ICD-10-CM | POA: Diagnosis not present

## 2023-04-08 DIAGNOSIS — N3000 Acute cystitis without hematuria: Secondary | ICD-10-CM

## 2023-04-08 DIAGNOSIS — R300 Dysuria: Secondary | ICD-10-CM

## 2023-04-08 DIAGNOSIS — D72825 Bandemia: Secondary | ICD-10-CM

## 2023-04-08 LAB — COMPREHENSIVE METABOLIC PANEL
ALT: 17 U/L (ref 0–44)
AST: 21 U/L (ref 15–41)
Albumin: 3.9 g/dL (ref 3.5–5.0)
Alkaline Phosphatase: 88 U/L (ref 38–126)
Anion gap: 9 (ref 5–15)
BUN: 12 mg/dL (ref 8–23)
CO2: 29 mmol/L (ref 22–32)
Calcium: 8.8 mg/dL — ABNORMAL LOW (ref 8.9–10.3)
Chloride: 93 mmol/L — ABNORMAL LOW (ref 98–111)
Creatinine, Ser: 0.51 mg/dL (ref 0.44–1.00)
GFR, Estimated: >60 mL/min (ref >60)
Glucose, Bld: 107 mg/dL — ABNORMAL HIGH (ref 70–99)
Potassium: 3.6 mmol/L (ref 3.5–5.1)
Sodium: 131 mmol/L — ABNORMAL LOW (ref 135–145)
Total Bilirubin: 0.3 mg/dL (ref 0.0–1.2)
Total Protein: 7.1 g/dL (ref 6.5–8.1)

## 2023-04-08 LAB — URINALYSIS, ROUTINE W REFLEX MICROSCOPIC
Bilirubin Urine: NEGATIVE
Glucose, UA: NEGATIVE mg/dL
Ketones, ur: NEGATIVE mg/dL
Leukocytes,Ua: NEGATIVE
Nitrite: NEGATIVE
Protein, ur: NEGATIVE mg/dL
Specific Gravity, Urine: 1.015 (ref 1.005–1.030)
pH: 5 (ref 5.0–8.0)

## 2023-04-08 LAB — CBC WITH DIFFERENTIAL/PLATELET
Abs Immature Granulocytes: 0.04 10*3/uL (ref 0.00–0.07)
Basophils Absolute: 0.1 10*3/uL (ref 0.0–0.1)
Basophils Relative: 1 %
Eosinophils Absolute: 0.1 10*3/uL (ref 0.0–0.5)
Eosinophils Relative: 1 %
HCT: 49.2 % — ABNORMAL HIGH (ref 36.0–46.0)
Hemoglobin: 16.5 g/dL — ABNORMAL HIGH (ref 12.0–15.0)
Immature Granulocytes: 0 %
Lymphocytes Relative: 15 %
Lymphs Abs: 1.9 10*3/uL (ref 0.7–4.0)
MCH: 30.4 pg (ref 26.0–34.0)
MCHC: 33.5 g/dL (ref 30.0–36.0)
MCV: 90.6 fL (ref 80.0–100.0)
Monocytes Absolute: 1.5 10*3/uL — ABNORMAL HIGH (ref 0.1–1.0)
Monocytes Relative: 12 %
Neutro Abs: 8.6 10*3/uL — ABNORMAL HIGH (ref 1.7–7.7)
Neutrophils Relative %: 71 %
Platelets: 240 10*3/uL (ref 150–400)
RBC: 5.43 MIL/uL — ABNORMAL HIGH (ref 3.87–5.11)
RDW: 17.2 % — ABNORMAL HIGH (ref 11.5–15.5)
WBC: 12.2 10*3/uL — ABNORMAL HIGH (ref 4.0–10.5)
nRBC: 0 % (ref 0.0–0.2)

## 2023-04-08 LAB — IRON AND TIBC
Iron: 65 ug/dL (ref 28–170)
Saturation Ratios: 20 % (ref 10.4–31.8)
TIBC: 319 ug/dL (ref 250–450)
UIBC: 254 ug/dL

## 2023-04-08 LAB — FERRITIN: Ferritin: 28 ng/mL (ref 11–307)

## 2023-04-08 MED ORDER — NITROFURANTOIN MONOHYD MACRO 100 MG PO CAPS: 100.0000 mg | ORAL_CAPSULE | Freq: Two times a day (BID) | ORAL | 0 refills | Status: AC

## 2023-04-08 MED ORDER — NITROFURANTOIN MONOHYD MACRO 100 MG PO CAPS: 100.0000 mg | ORAL_CAPSULE | Freq: Two times a day (BID) | ORAL | 0 refills | Status: DC

## 2023-04-08 NOTE — Assessment & Plan Note (Addendum)
Likely secondary to UTI. -Start antibiotics.

## 2023-04-08 NOTE — Assessment & Plan Note (Signed)
Patient reported increased urination and burning micturition.  Urinalysis showed bacteriuria. -Prescribed Macrobid for 7 days

## 2023-04-08 NOTE — Assessment & Plan Note (Addendum)
Patient has iron deficiency anemia likely secondary to chronic blood loss.  S/p IV iron with significant improvement in iron levels.  Patient is not anemic today. -Patient did not tolerate oral iron causing constipation. -Continue to monitor  Return to clinic in 6 months with labs.
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
## 2023-04-10 LAB — URINE CULTURE: Culture: >=100000 — AB

## 2023-04-11 ENCOUNTER — Telehealth: Payer: Self-pay
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
## 2023-04-11 NOTE — Telephone Encounter (Signed)
 She has an upcoming appt next month. She is going to be able to come to see [place] if her son is unable to drive? Can she have her friend or son help her with a virtual visit.

## 2023-04-11 NOTE — Telephone Encounter (Signed)
 Patient scheduled video visit for Thursday 04/14/23 . Patient will have her son assist.

## 2023-04-14 ENCOUNTER — Telehealth: Payer: 59 | Admitting: Nurse Practitioner

## 2023-04-14 ENCOUNTER — Telehealth: Payer: Self-pay | Admitting: *Deleted
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
## 2023-04-26 ENCOUNTER — Other Ambulatory Visit: Payer: Self-pay

## 2023-04-26 DIAGNOSIS — G8929 Other chronic pain: Secondary | ICD-10-CM

## 2023-04-26 MED ORDER — HYDROCODONE-ACETAMINOPHEN 10-325 MG PO TABS: ORAL_TABLET | ORAL | 0 refills | Status: DC

## 2023-04-26 NOTE — Telephone Encounter (Signed)
 Patient called this morning to ask Sharon Seller, NP for a refill for Hydrocodone to be sent to the pharmacy.  Patient was last seen in office for a visit on 04/08/23 and has an upcoming appointment with Sharon Seller, NP on 05/09/2023. FYI  Patient is requesting a refill of the following medications: Requested Prescriptions   Pending Prescriptions Disp Refills   HYDROcodone-acetaminophen (NORCO) 10-325 MG tablet 90 tablet 0    Sig: Take one tablet by mouth every 8 hours as needed for pain    Date of last refill: 03/25/2023  Refill amount: 90/0  Treatment agreement date: 03/26/2022  Message sent to Sharon Seller, NP

## 2023-05-06 ENCOUNTER — Encounter: Payer: 59 | Admitting: Nurse Practitioner

## 2023-05-07 ENCOUNTER — Other Ambulatory Visit: Payer: Self-pay | Admitting: Nurse Practitioner

## 2023-05-07 DIAGNOSIS — F419 Anxiety disorder, unspecified: Secondary | ICD-10-CM

## 2023-05-07 DIAGNOSIS — F32A Depression, unspecified: Secondary | ICD-10-CM

## 2023-05-07 DIAGNOSIS — G8929 Other chronic pain: Secondary | ICD-10-CM

## 2023-05-09 ENCOUNTER — Encounter: Payer: Self-pay | Admitting: Nurse Practitioner

## 2023-05-09 ENCOUNTER — Ambulatory Visit (INDEPENDENT_AMBULATORY_CARE_PROVIDER_SITE_OTHER): Payer: 59 | Admitting: Nurse Practitioner

## 2023-05-09 VITALS — BP 110/62 | HR 76 | Temp 97.0°F | Resp 16 | Ht 62.0 in | Wt 165.6 lb

## 2023-05-09 DIAGNOSIS — F1120 Opioid dependence, uncomplicated: Secondary | ICD-10-CM | POA: Diagnosis not present

## 2023-05-09 DIAGNOSIS — G8929 Other chronic pain: Secondary | ICD-10-CM

## 2023-05-09 DIAGNOSIS — M1990 Unspecified osteoarthritis, unspecified site: Secondary | ICD-10-CM

## 2023-05-09 DIAGNOSIS — E782 Mixed hyperlipidemia: Secondary | ICD-10-CM

## 2023-05-09 DIAGNOSIS — F419 Anxiety disorder, unspecified: Secondary | ICD-10-CM

## 2023-05-09 DIAGNOSIS — E1140 Type 2 diabetes mellitus with diabetic neuropathy, unspecified: Secondary | ICD-10-CM

## 2023-05-09 DIAGNOSIS — M545 Low back pain, unspecified: Secondary | ICD-10-CM | POA: Diagnosis not present

## 2023-05-09 DIAGNOSIS — K219 Gastro-esophageal reflux disease without esophagitis: Secondary | ICD-10-CM

## 2023-05-09 DIAGNOSIS — H3530 Unspecified macular degeneration: Secondary | ICD-10-CM

## 2023-05-09 DIAGNOSIS — E034 Atrophy of thyroid (acquired): Secondary | ICD-10-CM

## 2023-05-09 DIAGNOSIS — F32A Depression, unspecified: Secondary | ICD-10-CM

## 2023-05-09 DIAGNOSIS — D509 Iron deficiency anemia, unspecified: Secondary | ICD-10-CM

## 2023-05-09 MED ORDER — DICLOFENAC SODIUM 1 % EX GEL: 4.0000 g | Freq: Four times a day (QID) | CUTANEOUS | 5 refills | Status: AC
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
## 2023-05-09 NOTE — Patient Instructions (Signed)
 Change AWV to virtual.

## 2023-05-10 LAB — HEMOGLOBIN A1C
Hgb A1c MFr Bld: 6.1 %{Hb} — ABNORMAL HIGH (ref <5.7)
Mean Plasma Glucose: 128 mg/dL
eAG (mmol/L): 7.1 mmol/L

## 2023-05-10 LAB — TSH: TSH: 1.81 m[IU]/L (ref 0.40–4.50)

## 2023-05-10 LAB — LIPID PANEL
Cholesterol: 141 mg/dL (ref <200)
HDL: 49 mg/dL — ABNORMAL LOW (ref >=50)
LDL Cholesterol (Calc): 71 mg/dL
Non-HDL Cholesterol (Calc): 92 mg/dL (ref <130)
Total CHOL/HDL Ratio: 2.9 (calc) (ref <5.0)
Triglycerides: 119 mg/dL (ref <150)

## 2023-05-11 LAB — DM TEMPLATE

## 2023-05-11 LAB — DRUG MONITORING, PANEL 6 WITH CONFIRMATION, URINE
6 Acetylmorphine: NEGATIVE ng/mL (ref <10)
Alcohol Metabolites: NEGATIVE ng/mL (ref <500)
Amphetamines: NEGATIVE ng/mL (ref <500)
Barbiturates: NEGATIVE ng/mL (ref <300)
Benzodiazepines: NEGATIVE ng/mL (ref <100)
Cocaine Metabolite: NEGATIVE ng/mL (ref <150)
Codeine: NEGATIVE ng/mL (ref <50)
Creatinine: 34.3 mg/dL (ref >=20.0)
Hydrocodone: 628 ng/mL — ABNORMAL HIGH (ref <50)
Hydromorphone: NEGATIVE ng/mL (ref <50)
Marijuana Metabolite: NEGATIVE ng/mL (ref <20)
Methadone Metabolite: NEGATIVE ng/mL (ref <100)
Morphine: NEGATIVE ng/mL (ref <50)
Norhydrocodone: 1441 ng/mL — ABNORMAL HIGH (ref <50)
Opiates: POSITIVE ng/mL — AB (ref <100)
Oxidant: NEGATIVE ug/mL (ref <200)
Oxycodone: NEGATIVE ng/mL (ref <100)
Phencyclidine: NEGATIVE ng/mL (ref <25)
pH: 8.6 (ref 4.5–9.0)

## 2023-05-13 ENCOUNTER — Ambulatory Visit: Payer: 59 | Admitting: Nurse Practitioner

## 2023-05-13 ENCOUNTER — Telehealth: Payer: Self-pay

## 2023-05-13 NOTE — Telephone Encounter (Signed)
Message routed to PCP Eubanks, Jessica K, NP  

## 2023-05-13 NOTE — Telephone Encounter (Signed)
 Copied from CRM [id number]. Topic: Clinical - Lab/Test Results >> May 13, 2023  4:43 PM Adrianna P wrote: Reason for CRM: Patient returning call regarding lab results patient has questions about her urine test, and would like a call back.

## 2023-05-16 NOTE — Telephone Encounter (Signed)
 Urine was done for drug monitoring due to chronic hydrocodone use and consistent with medication prescribed.

## 2023-05-16 NOTE — Telephone Encounter (Signed)
Please clarify the question.

## 2023-05-16 NOTE — Telephone Encounter (Signed)
 Thank you for confirming results are normal. Patient didn't answer the phone. I left a voicemail for her to call back.

## 2023-05-16 NOTE — Telephone Encounter (Signed)
 Patient states she wanted to know if her UTI was gone. I told patient last time she had a urine culture was in February and another provider did that test. I asked her if she was still having symptoms and she said yes. I told her that she would need to be seen again. She refused and said that she will just wait and if things get worse she will call us back. Message routed back to PCP Janyth Contes, Janene Harvey, NP

## 2023-05-16 NOTE — Telephone Encounter (Signed)
 Unaware she was having symptoms, agree for her to call back and make appt if symptoms worsen.

## 2023-05-16 NOTE — Telephone Encounter (Signed)
 Copied from CRM [id number]. Topic: Clinical - Lab/Test Results >> May 16, 2023 10:25 AM Maryann Alar wrote: Reason for CRM: Patient returning Deisha Stull's phone call, but she was unavailable, please reach back out to patient.

## 2023-05-16 NOTE — Telephone Encounter (Signed)
 Can you tell me the results of urine test? There is nothing in there.

## 2023-05-17 ENCOUNTER — Telehealth: Payer: Self-pay

## 2023-05-17 NOTE — Telephone Encounter (Addendum)
 Copied from CRM [id number]. Topic: General - Inquiry >> May 17, 2023  9:26 AM Abigail Edwards wrote: Reason for CRM: Patient called in reference to a call she had received from the office. Patient was informed of message left for her to call back if symptoms worsen. Patient informed agent she is doing well and is not experiencing any symptoms at this time and will be sure to call back if she does.  Spoke with patient to go over labs results and Discussed results with patient, patient verbalized understanding of results

## 2023-05-17 NOTE — Telephone Encounter (Deleted)
 Copied from CRM [id number]. Topic: General - Inquiry >> May 17, 2023  9:26 AM Philippa Chester F wrote: Reason for CRM: Patient called in reference to a call she had received from the office. Patient was informed of message left for her to call back if symptoms worsen. Patient informed agent she is doing well and is not experiencing any symptoms at this time and will be sure to call back if she does.

## 2023-05-25 ENCOUNTER — Other Ambulatory Visit: Payer: Self-pay | Admitting: Nurse Practitioner

## 2023-05-25 DIAGNOSIS — G8929 Other chronic pain: Secondary | ICD-10-CM

## 2023-05-25 NOTE — Telephone Encounter (Signed)
 Patient has request refill on medication Hydrocodone. Medication last refilled 04/26/2023. Patient was given 90 tablets and 0 refills. Patient medication is to be taken once every 8 hours as needed for pain. Patient has Contract on file dated 03/30/2022. Patient has upcoming appointment 06/17/2023. Update Contract added to patient appointment notes. Medication pend and sent to PCP Janyth Contes Janene Harvey, NP

## 2023-05-25 NOTE — Telephone Encounter (Signed)
 Copied from CRM [id number]. Topic: Clinical - Medication Refill >> May 25, 2023  9:43 AM Corin V wrote: Most Recent Primary Care Visit:  Provider: Sharon Seller  Department: [facility]  Visit Type: OFFICE VISIT  Date: 05/09/2023  Medication: HYDROcodone-acetaminophen (NORCO) 10-325 MG tablet  Has the patient contacted their pharmacy? Yes (Agent: If no, request that the patient contact the pharmacy for the refill. If patient does not wish to contact the pharmacy document the reason why and proceed with request.) (Agent: If yes, when and what did the pharmacy advise?)  Is this the correct pharmacy for this prescription? Yes If no, delete pharmacy and type the correct one.  This is the patient's preferred pharmacy:  [id number] - [place], [place] - [address] AT [facility] [address] & [facility] [address] [place] [address] Phone: [phone] Fax: [phone]  Has the prescription been filled recently? No  Is the patient out of the medication? No  Has the patient been seen for an appointment in the last year OR does the patient have an upcoming appointment? Yes  Can we respond through MyChart? No  Agent: Please be advised that Rx refills may take up to 3 business days. We ask that you follow-up with your pharmacy.

## 2023-05-26 MED ORDER — HYDROCODONE-ACETAMINOPHEN 10-325 MG PO TABS: ORAL_TABLET | ORAL | 0 refills | Status: DC

## 2023-06-01 ENCOUNTER — Encounter: Payer: Self-pay | Admitting: Oncology

## 2023-06-06 ENCOUNTER — Telehealth: Payer: Self-pay

## 2023-06-06 NOTE — Telephone Encounter (Signed)
 Copied from CRM [id number]. Topic: General - Other >> Jun 03, 2023  3:54 PM Shamecia H wrote: Reason for CRM: Patient returned a call for Cherie. Patients Callback Number: [phone]

## 2023-06-06 NOTE — Telephone Encounter (Signed)
 No documentation of patient being called 06/03/2023.

## 2023-06-08 ENCOUNTER — Other Ambulatory Visit: Payer: Self-pay | Admitting: Nurse Practitioner

## 2023-06-08 DIAGNOSIS — E782 Mixed hyperlipidemia: Secondary | ICD-10-CM

## 2023-06-15 ENCOUNTER — Other Ambulatory Visit: Payer: Self-pay | Admitting: Nurse Practitioner

## 2023-06-15 DIAGNOSIS — K219 Gastro-esophageal reflux disease without esophagitis: Secondary | ICD-10-CM

## 2023-06-17 ENCOUNTER — Encounter: Payer: Self-pay | Admitting: Nurse Practitioner

## 2023-06-17 ENCOUNTER — Ambulatory Visit (INDEPENDENT_AMBULATORY_CARE_PROVIDER_SITE_OTHER): Payer: 59 | Admitting: Nurse Practitioner

## 2023-06-17 ENCOUNTER — Telehealth: Payer: Self-pay

## 2023-06-17 DIAGNOSIS — Z0000 Encounter for general adult medical examination without abnormal findings: Secondary | ICD-10-CM | POA: Diagnosis not present

## 2023-06-17 DIAGNOSIS — M545 Low back pain, unspecified: Secondary | ICD-10-CM

## 2023-06-17 DIAGNOSIS — M1990 Unspecified osteoarthritis, unspecified site: Secondary | ICD-10-CM
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
## 2023-06-17 NOTE — Progress Notes (Signed)
   This service is provided via telemedicine  No vital signs collected/recorded due to the encounter was a telemedicine visit.   Location of patient (ex: home, work):  Home  Patient consents to a telephone visit: Yes  Location of the provider (ex: office, home):  [facility] Adult [facility], Office   Name of any referring provider:  N/A  Names of all persons participating in the telemedicine service and their role in the encounter:  S.Chrae B/CMA, Abbey Chatters, NP, and Patient   Time spent on call:  9 min with medical assistant

## 2023-06-17 NOTE — Telephone Encounter (Signed)
 Patient called to say she is in need of a bedside commode due to trouble walking, neuropathy, and arthritis.  Patient would like order faxed to [facility]. Phone number is [phone]. I called [facility], spoke with Burdette Carolin and she confirmed that they do supply bedside commodes.  Fax order to [phone]

## 2023-06-17 NOTE — Patient Instructions (Signed)
  Abigail Edwards , Thank you for taking time to come for your Medicare Wellness Visit. I appreciate your ongoing commitment to your health goals. Please review the following plan we discussed and let me know if I can assist you in the future.     This is a list of the screening recommended for you and due dates:  Health Maintenance  Topic Date Due   Eye exam for diabetics  08/01/2021   COVID-19 Vaccine (5 - Pfizer risk 2024-25 season) 11/23/2023*   DTaP/Tdap/Td vaccine (2 - Tdap) 06/16/2024*   Flu Shot  02/23/2048*   Hemoglobin A1C  11/09/2023   Complete foot exam   05/08/2024   Medicare Annual Wellness Visit  06/16/2024   Pneumonia Vaccine  Completed   DEXA scan (bone density measurement)  Completed   Zoster (Shingles) Vaccine  Completed   HPV Vaccine  Aged Out   Meningitis B Vaccine  Aged Out  *Topic was postponed. The date shown is not the original due date.

## 2023-06-23 ENCOUNTER — Encounter: Payer: Self-pay | Admitting: Oncology

## 2023-06-23 ENCOUNTER — Other Ambulatory Visit: Payer: Self-pay | Admitting: Nurse Practitioner

## 2023-06-23 DIAGNOSIS — M5441 Lumbago with sciatica, right side: Secondary | ICD-10-CM

## 2023-06-23 NOTE — Telephone Encounter (Signed)
 Order completed and printed, please fax

## 2023-06-24 ENCOUNTER — Other Ambulatory Visit: Payer: Self-pay

## 2023-06-24 DIAGNOSIS — M5441 Lumbago with sciatica, right side: Secondary | ICD-10-CM

## 2023-06-24 MED ORDER — HYDROCODONE-ACETAMINOPHEN 10-325 MG PO TABS
ORAL_TABLET | ORAL | 0 refills | Status: DC
Start: 2023-06-24 — End: 2023-07-22

## 2023-06-24 NOTE — Telephone Encounter (Signed)
 Patient called and states that she needs refill on Hydrocodone  NOW because she's in a lot of pain and pharmacy is closed on the weekends. Medication last refilled 05/26/2023. Patient has no contract on file. Next appointment has sign contract in notes. Medication pend and sent to PCP Roselie Conger Champ Coma, NP

## 2023-06-24 NOTE — Telephone Encounter (Signed)
 Order handed to me this morning by PCP Roselie Conger Champ Coma, NP . I have faxed to [facility] with number provided [phone]

## 2023-06-27 NOTE — Telephone Encounter (Signed)
 Refill completed by PCP Eubanks, Jessica K, NP

## 2023-06-29 ENCOUNTER — Encounter: Payer: Self-pay | Admitting: Oncology

## 2023-07-03 ENCOUNTER — Other Ambulatory Visit: Payer: Self-pay | Admitting: Nurse Practitioner

## 2023-07-03 DIAGNOSIS — I10 Essential (primary) hypertension: Secondary | ICD-10-CM

## 2023-07-04 ENCOUNTER — Telehealth: Admitting: *Deleted

## 2023-07-04 NOTE — Telephone Encounter (Signed)
 Have you received any forms from [facility] for patient? Please Advise.

## 2023-07-04 NOTE — Telephone Encounter (Signed)
Pharmacy Requested refill.  Pended Rx and sent to Jessica for approval due to HIGH ALERT Warning.  

## 2023-07-04 NOTE — Telephone Encounter (Signed)
 Yes signed today

## 2023-07-04 NOTE — Telephone Encounter (Signed)
Patient Notified and agreed. 

## 2023-07-04 NOTE — Telephone Encounter (Signed)
 Copied from CRM [id number]. Topic: General - Other >> [name] 9, 2025  3:43 PM [name] H wrote: Reason for CRM: Patient received an email stating that they need documents and a signature from the provider from [facility], patients callback number [phone]. >> Abigail Edwards 12, 2025  1:24 PM Abigail Edwards wrote: Attn: [name] [name] calling back in regards to the status of the incontinence supplies documents are the completed? Please advise, patient would like a call back today.

## 2023-07-04 NOTE — Telephone Encounter (Signed)
 Copied from CRM [id number]. Topic: General - Other >> [name] 9, 2025  3:43 PM [name] H wrote: Reason for CRM: Patient received an email stating that they need documents and a signature from the provider from [facility], patients callback number [phone]. >> Tiauna Whisnant 12, 2025  1:24 PM Carrielelia G wrote: Attn: [name] [name] calling back in regards to the status of the incontinence supplies documents are the completed? Please advise, patient would like a call back today.

## 2023-07-07 ENCOUNTER — Encounter: Payer: Self-pay | Admitting: Oncology

## 2023-07-15 ENCOUNTER — Telehealth: Payer: Self-pay | Admitting: Nurse Practitioner

## 2023-07-15 DIAGNOSIS — F419 Anxiety disorder, unspecified: Secondary | ICD-10-CM

## 2023-07-15 DIAGNOSIS — G8929 Other chronic pain: Secondary | ICD-10-CM

## 2023-07-15 NOTE — Telephone Encounter (Signed)
 Called patient's preferred pharmacy and they state they can pull the prescription and fill it for the patient.

## 2023-07-15 NOTE — Telephone Encounter (Signed)
 Copied from CRM [id number]. Topic: Clinical - Medication Refill >> Jul 15, 2023  1:26 PM Brynn Caras wrote: Medication: busPIRone  (BUSPAR ) 5 MG tablet  Has the patient contacted their pharmacy? Yes (Agent: If no, request that the patient contact the pharmacy for the refill. If patient does not wish to contact the pharmacy document the reason why and proceed with request.) (Agent: If yes, when and what did the pharmacy advise?)  This is the patient's preferred pharmacy:   [id number] - [place], [place] - [address] [facility] [address] [place] [address] Phone: [phone] Fax: [phone]  Is this the correct pharmacy for this prescription? Yes If no, delete pharmacy and type the correct one.   Has the prescription been filled recently? No  Is the patient out of the medication? Yes  Has the patient been seen for an appointment in the last year OR does the patient have an upcoming appointment? Yes  Can we respond through MyChart? Yes  Agent: Please be advised that Rx refills may take up to 3 business days. We ask that you follow-up with your pharmacy.

## 2023-07-22 MED ORDER — HYDROCODONE-ACETAMINOPHEN 10-325 MG PO TABS: ORAL_TABLET | ORAL | 0 refills | Status: DC

## 2023-07-22 NOTE — Telephone Encounter (Signed)
 Patient is requesting a refill of the following medications: Requested Prescriptions   Pending Prescriptions Disp Refills   HYDROcodone -acetaminophen  (NORCO) 10-325 MG tablet 90 tablet 0    Sig: Take one tablet by mouth every 8 hours as needed for pain    Date of last refill: 06/24/2023  Refill amount: 90  Treatment agreement date: Notation made on pending appointment

## 2023-07-22 NOTE — Addendum Note (Signed)
 Addended by: Weylin Plagge Y on: 07/22/2023 10:13 AM   Modules accepted: Orders

## 2023-07-22 NOTE — Telephone Encounter (Unsigned)
 Copied from CRM [id number]. Topic: Clinical - Medication Refill >> Jul 22, 2023  9:22 AM Retta Caster wrote: Medication: HYDROcodone -acetaminophen  (NORCO) 10-325 MG tablet   Has the patient contacted their pharmacy? Yes (Agent: If no, request that the patient contact the pharmacy for the refill. If patient does not wish to contact the pharmacy document the reason why and proceed with request.) (Agent: If yes, when and what did the pharmacy advise?)  This is the patient's preferred pharmacy:    [id number] - [place], [place] - [address] AT [facility] [address] [place] [address] Phone: [phone] Fax: [phone]  Is this the correct pharmacy for this prescription? Yes If no, delete pharmacy and type the correct one.   Has the prescription been filled recently? Yes  Is the patient out of the medication? Yes  Has the patient been seen for an appointment in the last year OR does the patient have an upcoming appointment? Yes  Can we respond through MyChart? Yes  Agent: Please be advised that Rx refills may take up to 3 business days. We ask that you follow-up with your pharmacy.

## 2023-08-03 ENCOUNTER — Encounter: Payer: Self-pay | Admitting: Oncology

## 2023-08-08 ENCOUNTER — Telehealth: Payer: Self-pay

## 2023-08-08 NOTE — Telephone Encounter (Signed)
 Message received from pharmacy stating patient may be taking medication more than prescribed.  I spoke with patient and she confirmed that she is taking medication as prescribe. Patient states she accidentally spilled some pill a while ago and that may have gotten her refills off track, however she does not need a refill at this time.

## 2023-08-19 ENCOUNTER — Other Ambulatory Visit: Payer: Self-pay | Admitting: Nurse Practitioner

## 2023-08-19 DIAGNOSIS — M5442 Lumbago with sciatica, left side: Secondary | ICD-10-CM

## 2023-08-19 MED ORDER — HYDROCODONE-ACETAMINOPHEN 10-325 MG PO TABS: ORAL_TABLET | ORAL | 0 refills | Status: DC

## 2023-08-19 NOTE — Telephone Encounter (Signed)
 Patient requested refill  Epic LR: 07/22/2023 Contract Date; 03/26/2022-Note added to upcoming appointment to update.   Pended Rx and sent to [facility] for approval.

## 2023-08-19 NOTE — Telephone Encounter (Unsigned)
 Copied from CRM [id number]. Topic: Clinical - Medication Refill >> Aug 19, 2023  9:17 AM Alfonso ORN wrote: Medication: HYDROcodone -acetaminophen  (NORCO) 10-325 MG tablet  Has the patient contacted their pharmacy? No (Agent: If no, request that the patient contact the pharmacy for the refill. If patient does not wish to contact the pharmacy document the reason why and proceed with request.) (Agent: If yes, when and what did the pharmacy advise?)  This is the patient's preferred pharmacy:   [facility] #[id number] - [place], [place] - [address] AT [facility] [address] [place] [address] Phone: [phone] Fax: [phone]  Is this the correct pharmacy for this prescription? Yes If no, delete pharmacy and type the correct one.   Has the prescription been filled recently? No  Is the patient out of the medication? No will be out as of tomorrow 08/20/23  Has the patient been seen for an appointment in the last year OR does the patient have an upcoming appointment? Yes  Can we respond through MyChart? No  Agent: Please be advised that Rx refills may take up to 3 business days. We ask that you follow-up with your pharmacy.

## 2023-08-29 ENCOUNTER — Telehealth: Payer: Self-pay | Admitting: *Deleted

## 2023-08-29 NOTE — Telephone Encounter (Signed)
 No DPR on File to discuss patient information with Daughter, Almarie Font.   Tried calling daughter, LMOM to return call.

## 2023-08-29 NOTE — Telephone Encounter (Signed)
 Copied from CRM [id number]. Topic: Clinical - Medication Question >> Aug 29, 2023 11:08 AM Adrianna P wrote: Reason for CRM: Daughter called she has some questions regarding her mother's medication. CYMBALTA  and BUSPAR . Her mom has been really anxious. Please call her at [phone] >> Aug 29, 2023 11:11 AM Mace SQUIBB wrote: Rick her daughter name is Almarie Font

## 2023-08-31 ENCOUNTER — Telehealth

## 2023-08-31 ENCOUNTER — Telehealth: Payer: Self-pay

## 2023-08-31 NOTE — Telephone Encounter (Signed)
 Both rx's were approved in March 2025 for a year supply and refills should be on file at the pharmacy.   Outgoing call placed to [facility] to confirm refills on file and pharmacy tech stated they were on file and they will prepare for pick up, should be available after 2:30 pm  Patient aware and asked that I add her daughter Charleen to her chart as a person of contact being that her son passed away in June 19, 2023. Lynnae was unable to give me [facility] number and stated she would call back with it.   Chrae (sh-ray) Flay Ghosh/CMA

## 2023-08-31 NOTE — Telephone Encounter (Signed)
 Copied from CRM [id number]. Topic: Clinical - Order For Equipment >> Aug 31, 2023  4:28 PM Miquel SAILOR wrote: Reason for CRM: Will  from home care delivery on patient supplies for Bladder control pads. Will call back if he needs anything else. Just fyi for office [phone]

## 2023-08-31 NOTE — Telephone Encounter (Signed)
 Copied from CRM [id number]. Topic: Clinical - Prescription Issue >> Aug 31, 2023 12:33 PM Susanna ORN wrote: Reason for CRM: Patient called in requesting to have Sherae to give her a call. States that [facility] always helps her. Patient states her pharmacy has been calling to get her anxiety medications, busPIRone  (BUSPAR ) 5 MG tablet & DULoxetine  (CYMBALTA ) 60 MG capsule filled but they haven't gotten any responses back from Dr. Caro. Patient wants to know why. She also states that her daughter, Charleen Font, will become her power of attorney, shortly & she wants her to be able to speak with Dr. Caro. Advised her that a DPR form will need to be signed and she stated that she can't see, as she's legally blind, and wants to know if the form can be sent to her and she will have her friend to help her complete it. Please have Sherae to give patient a call back. CB #: [phone].

## 2023-08-31 NOTE — Telephone Encounter (Signed)
 Patient's daughter, Almarie Starlyn) Marylene called to speak with Chrae in regards to her mother. Please give her a call back. CB #: [phone]

## 2023-09-02 ENCOUNTER — Other Ambulatory Visit: Payer: Self-pay | Admitting: Nurse Practitioner

## 2023-09-02 ENCOUNTER — Other Ambulatory Visit: Payer: Self-pay

## 2023-09-02 ENCOUNTER — Ambulatory Visit: Payer: Self-pay | Admitting: *Deleted

## 2023-09-02 DIAGNOSIS — M5442 Lumbago with sciatica, left side: Secondary | ICD-10-CM

## 2023-09-02 MED ORDER — HYDROCODONE-ACETAMINOPHEN 10-325 MG PO TABS: ORAL_TABLET | ORAL | 0 refills | Status: DC

## 2023-09-02 NOTE — Telephone Encounter (Signed)
Message routed to covering provider
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
## 2023-09-02 NOTE — Telephone Encounter (Signed)
 Patient is requesting a refill of the following medications: Requested Prescriptions   Pending Prescriptions Disp Refills   HYDROcodone -acetaminophen  (NORCO) 10-325 MG tablet 90 tablet 0    Sig: Take one tablet by mouth every 8 hours as needed for pain    Date of last refill: 08/19/23  Refill amount: 90  Treatment agreement date: No treatment agreement on file within the last 12 months, notation made on pending appointment for 10/28/23

## 2023-09-05 ENCOUNTER — Other Ambulatory Visit: Payer: Self-pay

## 2023-09-05 ENCOUNTER — Other Ambulatory Visit: Payer: Self-pay | Admitting: Orthopedic Surgery

## 2023-09-05 DIAGNOSIS — M5442 Lumbago with sciatica, left side: Secondary | ICD-10-CM

## 2023-09-05 MED ORDER — HYDROCODONE-ACETAMINOPHEN 10-325 MG PO TABS: ORAL_TABLET | ORAL | 0 refills | Status: DC
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
## 2023-09-05 NOTE — Telephone Encounter (Signed)
 Prescription pend and sent back to Mast, Man, NP

## 2023-09-05 NOTE — Telephone Encounter (Signed)
 Providers response sent to patient via mychart

## 2023-09-05 NOTE — Telephone Encounter (Signed)
 I also tried to call patient and call would not go through

## 2023-09-05 NOTE — Telephone Encounter (Signed)
 Mast, Man X, NP to Me (Selected Message)     09/02/23  3:02 PM Go head pend a prescription of 3 days supplies to me to be signed.  Thanks.

## 2023-09-05 NOTE — Telephone Encounter (Signed)
 Gil Greig BRAVO, NP to Me (Selected Message)     09/05/23  4:07 PM She may have her normal prescription refilled 07/27. Manxie sent in 3 tablets. I sent prescription for 1 tablet x 10 days until she may refill again.   Spoke with patient and shared the above response, patient verbalized understanding.
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
## 2023-09-06 ENCOUNTER — Other Ambulatory Visit: Payer: Self-pay | Admitting: Nurse Practitioner

## 2023-09-06 ENCOUNTER — Other Ambulatory Visit: Payer: Self-pay | Admitting: Orthopedic Surgery

## 2023-09-06 ENCOUNTER — Ambulatory Visit: Payer: Self-pay

## 2023-09-06 DIAGNOSIS — M5441 Lumbago with sciatica, right side: Secondary | ICD-10-CM

## 2023-09-06 MED ORDER — HYDROCODONE-ACETAMINOPHEN 10-325 MG PO TABS: ORAL_TABLET | ORAL | 0 refills | Status: DC
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
## 2023-09-06 NOTE — Telephone Encounter (Addendum)
 Patient has been notified   Per Greig Cluster--   09/05/23  4:07 PM She Alford Gamero have her normal prescription refilled 07/27. Manxie sent in 3 tablets. I sent prescription for 1 tablet x 10 days until she Ileen Kahre refill again.    Spoke with patient and shared the above response, patient verbalized understanding.      HYDROcodone -acetaminophen  (NORCO) 10-325 MG tablet10 tablet07/17/2025--Sig: Take one tablet by mouth every 8 hours as needed for painSent to pharmacy as: HYDROcodone -acetaminophen  (NORCO) 10-325 MG tabletEarliest Fill Date: 7/17/2025E-Prescribing Status: Receipt confirmed by pharmacy (09/05/2023  4:06 PM EDT)

## 2023-09-06 NOTE — Addendum Note (Signed)
 Addended by: LYNNANN COUGH A on: 09/06/2023 09:18 AM   Modules accepted: Orders
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
## 2023-09-06 NOTE — Telephone Encounter (Signed)
 Noted

## 2023-09-08 ENCOUNTER — Encounter: Payer: Self-pay | Admitting: Oncology

## 2023-10-13 ENCOUNTER — Inpatient Hospital Stay

## 2023-10-13 ENCOUNTER — Inpatient Hospital Stay: Payer: 59 | Admitting: Oncology

## 2023-10-13 ENCOUNTER — Other Ambulatory Visit: Payer: Self-pay

## 2023-10-13 ENCOUNTER — Other Ambulatory Visit: Payer: 59

## 2023-10-13 DIAGNOSIS — G8929 Other chronic pain: Secondary | ICD-10-CM

## 2023-10-13 MED ORDER — HYDROCODONE-ACETAMINOPHEN 10-325 MG PO TABS: ORAL_TABLET | ORAL | 0 refills | Status: DC

## 2023-10-13 NOTE — Telephone Encounter (Signed)
 Copied from CRM [id number]. Topic: General - Other >> Oct 13, 2023 12:37 PM Miquel SAILOR wrote: Reason for CRM: Patient requesting to speak to office only does not want to speak to us  . Called and transferred to [place]

## 2023-10-13 NOTE — Progress Notes (Incomplete)
 [facility] at [facility] HEMATOLOGY FOLLOW-UP VISIT  Abigail Harlene POUR, NP  REASON FOR FOLLOW-UP: Iron deficiency anemia  ASSESSMENT & PLAN:  No problem-specific Assessment & Plan notes found for this encounter.    No orders of the defined types were placed in this encounter.   The total time spent in the appointment was 60 minutes encounter with patients including review of chart and various tests results, discussions about plan of care and coordination of care plan   All questions were answered. The patient knows to call the clinic with any problems, questions or concerns. No barriers to learning was detected.   LILLETTE [name],acting as a Neurosurgeon for Abigail Dry, MD.,have documented all relevant documentation on the behalf of Abigail Dry, MD,as directed by  Abigail Dry, MD while in the presence of Abigail Dry, MD.  ***   [place] R Teague 8/21/20258:08 AM   SUMMARY OF HEMATOLOGIC HISTORY: Iron deficiency anemia-likely secondary to chronic blood loss and malabsorption -S/p IV Feraheme  on 01/06/2023 and 01/13/2023  INTERVAL HISTORY: Abigail Edwards 89 y.o. female following for iron deficiency anemia.    I have reviewed the past medical history, past surgical history, social history and family history with the patient   ALLERGIES:  is allergic to valium [diazepam], morphine  and codeine, and penicillins.  MEDICATIONS:  Current Outpatient Medications  Medication Sig Dispense Refill   atorvastatin  (LIPITOR) 20 MG tablet TAKE 1 TABLET BY MOUTH ONCE  DAILY 100 tablet 2   busPIRone  (BUSPAR ) 5 MG tablet TAKE 1 TABLET(5 MG) BY MOUTH THREE TIMES DAILY 270 tablet 3   diclofenac  Sodium (VOLTAREN ) 1 % GEL Apply 4 g topically 4 (four) times daily. 350 g 5   DULoxetine  (CYMBALTA ) 60 MG capsule TAKE 1 CAPSULE(60 MG) BY MOUTH DAILY 90 capsule 3   gabapentin  (NEURONTIN ) 400 MG capsule Take 400 mg by mouth 3 (three) times daily.      HYDROcodone -acetaminophen  (NORCO) 10-325 MG tablet Take one tablet by mouth every 8 hours as needed for pain 10 tablet 0   Iron-FA-B Cmp-C-Biot-Probiotic (FUSION PLUS) CAPS 1 tablet daily with food. 30 capsule 3   ketoconazole (NIZORAL) 2 % cream APPLY TO AFFECTED AREA TWICE DAILY BETWEEN TOES ON RIGHT FOOT     levothyroxine  (SYNTHROID ) 125 MCG tablet Take 1 tablet (125 mcg total) by mouth daily. 100 tablet 2   OXYGEN  Inhale 3 L into the lungs at bedtime. Has sleep apnea and uses 3 liters at bedtime     pantoprazole  (PROTONIX ) 40 MG tablet TAKE 1 TABLET(40 MG) BY MOUTH DAILY 90 tablet 1   potassium chloride  (KLOR-CON ) 10 MEQ tablet TAKE 1 TABLET(10 MEQ) BY MOUTH TWICE DAILY 180 tablet 3   Saccharomyces boulardii (PROBIOTIC) 250 MG CAPS 1 tablet daily 60 capsule 11   triamterene -hydrochlorothiazide (MAXZIDE-25) 37.5-25 MG tablet TAKE 1 TABLET BY MOUTH DAILY 100 tablet 1   UNABLE TO FIND Blood pressure cuff Dx: HTN I10- to take blood pressure three times weekly 1 Device 0   No current facility-administered medications for this visit.     REVIEW OF SYSTEMS:   Constitutional: Denies fevers, chills or night sweats Eyes: Denies blurriness of vision Ears, nose, mouth, throat, and face: Denies mucositis or sore throat Respiratory: Denies cough, dyspnea or wheezes Cardiovascular: Denies palpitation, chest discomfort or lower extremity swelling Gastrointestinal:  Denies nausea, heartburn or change in bowel habits Skin: Denies abnormal skin rashes Lymphatics: Denies new lymphadenopathy or easy bruising Neurological:Denies numbness, tingling or new weaknesses  Behavioral/Psych: Mood is stable, no new changes  All other systems were reviewed with the patient and are negative.  PHYSICAL EXAMINATION:   There were no vitals filed for this visit.   GENERAL:alert, tearful and in a wheel chair  LUNGS: clear to auscultation and percussion with normal breathing effort HEART: regular rate & rhythm and no  murmurs and no lower extremity edema ABDOMEN:abdomen soft, non-tender and normal bowel sounds Musculoskeletal:no cyanosis of digits and no clubbing  NEURO: alert & oriented x 3 with fluent speech, no focal motor/sensory deficits  LABORATORY DATA:  I have reviewed the data as listed  Lab Results  Component Value Date   WBC 12.2 (H) 04/08/2023   NEUTROABS 8.6 (H) 04/08/2023   HGB 16.5 (H) 04/08/2023   HCT 49.2 (H) 04/08/2023   MCV 90.6 04/08/2023   PLT 240 04/08/2023      Chemistry      Component Value Date/Time   NA 131 (L) 04/08/2023 1248   NA 130 (L) 08/08/2015 1035   K 3.6 04/08/2023 1248   CL 93 (L) 04/08/2023 1248   CO2 29 04/08/2023 1248   BUN 12 04/08/2023 1248   BUN 12 08/08/2015 1035   CREATININE 0.51 04/08/2023 1248   CREATININE 0.58 (L) 11/08/2022 1152      Component Value Date/Time   CALCIUM  8.8 (L) 04/08/2023 1248   ALKPHOS 88 04/08/2023 1248   AST 21 04/08/2023 1248   ALT 17 04/08/2023 1248   BILITOT 0.3 04/08/2023 1248   BILITOT 0.2 08/01/2015 1454      Latest Reference Range & Units 11/08/22 11:52 01/28/23 09:32 04/08/23 12:48  Iron 28 - 170 ug/dL 13 (L) 68 65  UIBC ug/dL  765 745  TIBC 749 - 549 ug/dL 611 697 680  %SAT 16 - 45 % (calc) 3 (L)    Saturation Ratios 10.4 - 31.8 %  23 20  Ferritin 11 - 307 ng/mL 5 (L) 114 28  (L): Data is abnormally low
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
## 2023-10-28 ENCOUNTER — Ambulatory Visit: Admitting: Nurse Practitioner

## 2023-10-31 ENCOUNTER — Other Ambulatory Visit: Payer: Self-pay | Admitting: Nurse Practitioner

## 2023-10-31 DIAGNOSIS — K219 Gastro-esophageal reflux disease without esophagitis: Secondary | ICD-10-CM

## 2023-11-11 ENCOUNTER — Other Ambulatory Visit: Payer: Self-pay | Admitting: Nurse Practitioner

## 2023-11-11 DIAGNOSIS — M5442 Lumbago with sciatica, left side: Secondary | ICD-10-CM

## 2023-11-11 MED ORDER — HYDROCODONE-ACETAMINOPHEN 10-325 MG PO TABS: ORAL_TABLET | ORAL | 0 refills | Status: DC

## 2023-11-11 NOTE — Telephone Encounter (Signed)
 Requesting: hydrocodone   Contract: Yes Dosage 10-325, quantity 90, 30 days  Last Visit: 06/17/2023 Next Visit: 12/30/2023 Last Refill: 10/13/2023  Please Advise

## 2023-11-11 NOTE — Telephone Encounter (Signed)
 Copied from CRM [id number]. Topic: Clinical - Medication Refill >> Nov 11, 2023  8:01 AM Latavia C wrote: Medication: HYDROcodone -acetaminophen  (NORCO) 10-325 MG tablet  Has the patient contacted their pharmacy? Yes Yes, patient was advised to contact the provider  This is the patient's preferred pharmacy:   [facility] #[id number] - [place], [place] - [address] AT [facility] [address] [place] [address] Phone: [phone] Fax: [phone]  Is this the correct pharmacy for this prescription? Yes  Has the prescription been filled recently? No  Is the patient out of the medication? No she has 1 remaining  Has the patient been seen for an appointment in the last year OR does the patient have an upcoming appointment? Yes  Can we respond through MyChart? Yes  Agent: Please be advised that Rx refills may take up to 3 business days. We ask that you follow-up with your pharmacy.

## 2023-11-14 ENCOUNTER — Other Ambulatory Visit: Payer: Self-pay

## 2023-11-14 DIAGNOSIS — M5441 Lumbago with sciatica, right side: Secondary | ICD-10-CM

## 2023-11-14 NOTE — Telephone Encounter (Signed)
 Fax from [facility] came into [facility]. Patient has request for refill on Hydrocodone . Patient last refill was 11/11/2023. Patient has contract on file dated 04/20/2023. Medication pend and sent to PCP Arch, Jessica K, NP) for approval.

## 2023-11-17 NOTE — Telephone Encounter (Signed)
 Medication refilled by PCP [name], NP 11/11/2023. Nothing else follows at this time.

## 2023-11-29 ENCOUNTER — Other Ambulatory Visit

## 2023-11-29 ENCOUNTER — Inpatient Hospital Stay: Admitting: Oncology

## 2023-12-09 ENCOUNTER — Other Ambulatory Visit: Payer: Self-pay | Admitting: Nurse Practitioner

## 2023-12-09 DIAGNOSIS — G8929 Other chronic pain: Secondary | ICD-10-CM

## 2023-12-09 MED ORDER — HYDROCODONE-ACETAMINOPHEN 10-325 MG PO TABS: ORAL_TABLET | ORAL | 0 refills | Status: DC

## 2023-12-09 NOTE — Telephone Encounter (Signed)
Controlled substance 

## 2023-12-09 NOTE — Telephone Encounter (Signed)
 Copied from CRM [id number]. Topic: Clinical - Medication Refill >> Dec 09, 2023  9:08 AM Miquel SAILOR wrote: Medication: HYDROcodone -acetaminophen  (NORCO) 10-325 MG tablet   Has the patient contacted their pharmacy? Yes (Agent: If no, request that the patient contact the pharmacy for the refill. If patient does not wish to contact the pharmacy document the reason why and proceed with request.) (Agent: If yes, when and what did the pharmacy advise?)  This is the patient's preferred pharmacy:    [facility] #[id number] - [place], [place] - [address] AT [facility] [address] [place] [place] [address] Phone: [phone] Fax: [phone]    Is this the correct pharmacy for this prescription? Yes If no, delete pharmacy and type the correct one.   Has the prescription been filled recently? Yes  Is the patient out of the medication? No 3 pills   Has the patient been seen for an appointment in the last year OR does the patient have an upcoming appointment? Yes  Can we respond through MyChart? Yes  Agent: Please be advised that Rx refills may take up to 3 business days. We ask that you follow-up with your pharmacy.

## 2023-12-10 ENCOUNTER — Other Ambulatory Visit: Payer: Self-pay | Admitting: Nurse Practitioner

## 2023-12-10 DIAGNOSIS — I10 Essential (primary) hypertension: Secondary | ICD-10-CM

## 2023-12-12 NOTE — Telephone Encounter (Signed)
Pharmacy requested refill. Pended Rx and sent to [facility] for approval due to HIGH ALERT Warning.  

## 2023-12-16 ENCOUNTER — Other Ambulatory Visit

## 2023-12-16 ENCOUNTER — Inpatient Hospital Stay: Admitting: Oncology

## 2023-12-16 ENCOUNTER — Inpatient Hospital Stay: Attending: Oncology

## 2023-12-16 VITALS — BP 133/53 | HR 72 | Temp 98.5°F | Resp 18

## 2023-12-16 DIAGNOSIS — Z862 Personal history of diseases of the blood and blood-forming organs and certain disorders involving the immune mechanism: Secondary | ICD-10-CM | POA: Insufficient documentation

## 2023-12-16 DIAGNOSIS — E538 Deficiency of other specified B group vitamins: Secondary | ICD-10-CM | POA: Diagnosis present

## 2023-12-16 DIAGNOSIS — D509 Iron deficiency anemia, unspecified: Secondary | ICD-10-CM

## 2023-12-16 LAB — COMPREHENSIVE METABOLIC PANEL WITH GFR
ALT: 12 U/L (ref 0–44)
AST: 25 U/L (ref 15–41)
Albumin: 4 g/dL (ref 3.5–5.0)
Alkaline Phosphatase: 84 U/L (ref 38–126)
Anion gap: 8 (ref 5–15)
BUN: 11 mg/dL (ref 8–23)
CO2: 33 mmol/L — ABNORMAL HIGH (ref 22–32)
Calcium: 8.9 mg/dL (ref 8.9–10.3)
Chloride: 88 mmol/L — ABNORMAL LOW (ref 98–111)
Creatinine, Ser: 0.58 mg/dL (ref 0.44–1.00)
GFR, Estimated: >60 mL/min (ref >60)
Glucose, Bld: 112 mg/dL — ABNORMAL HIGH (ref 70–99)
Potassium: 3.8 mmol/L (ref 3.5–5.1)
Sodium: 129 mmol/L — ABNORMAL LOW (ref 135–145)
Total Bilirubin: 0.5 mg/dL (ref 0.0–1.2)
Total Protein: 6.8 g/dL (ref 6.5–8.1)

## 2023-12-16 LAB — CBC WITH DIFFERENTIAL/PLATELET
Abs Immature Granulocytes: 0.14 K/uL — ABNORMAL HIGH (ref 0.00–0.07)
Basophils Absolute: 0 K/uL (ref 0.0–0.1)
Basophils Relative: 0 %
Eosinophils Absolute: 0.3 K/uL (ref 0.0–0.5)
Eosinophils Relative: 3 %
HCT: 44.2 % (ref 36.0–46.0)
Hemoglobin: 14.8 g/dL (ref 12.0–15.0)
Immature Granulocytes: 1 %
Lymphocytes Relative: 18 %
Lymphs Abs: 1.7 K/uL (ref 0.7–4.0)
MCH: 31.5 pg (ref 26.0–34.0)
MCHC: 33.5 g/dL (ref 30.0–36.0)
MCV: 94 fL (ref 80.0–100.0)
Monocytes Absolute: 1.2 K/uL — ABNORMAL HIGH (ref 0.1–1.0)
Monocytes Relative: 12 %
Neutro Abs: 6.5 K/uL (ref 1.7–7.7)
Neutrophils Relative %: 66 %
Platelets: 227 K/uL (ref 150–400)
RBC: 4.7 MIL/uL (ref 3.87–5.11)
RDW: 13.2 % (ref 11.5–15.5)
WBC: 9.8 K/uL (ref 4.0–10.5)
nRBC: 0 % (ref 0.0–0.2)

## 2023-12-16 LAB — FERRITIN: Ferritin: 141 ng/mL (ref 11–307)

## 2023-12-16 LAB — VITAMIN B12: Vitamin B-12: 894 pg/mL (ref 180–914)

## 2023-12-16 LAB — IRON AND TIBC
Iron: 101 ug/dL (ref 28–170)
Saturation Ratios: 37 % — ABNORMAL HIGH (ref 10.4–31.8)
TIBC: 272 ug/dL (ref 250–450)
UIBC: 171 ug/dL

## 2023-12-16 LAB — FOLATE: Folate: 4.2 ng/mL — ABNORMAL LOW (ref >5.9)
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
## 2023-12-19 ENCOUNTER — Ambulatory Visit: Payer: Self-pay

## 2023-12-19 NOTE — Telephone Encounter (Signed)
 Message received from Dr. Davonna:  Can you let her know that her folate level is low and need replacement with folic acid 1mg  daily? She can follow up with primary care for that and does not need further hematology follow up.  Called patient directly and advised her of the above message. Patient verbalized understanding.

## 2023-12-19 NOTE — Telephone Encounter (Signed)
-----   Message from [facility] sent at 12/16/2023  1:26 PM EDT ----- Hi all,  Can you let her know that her folate level is low and need replacement with folic acid 1mg  daily? She can follow up with primary care for that and does not need further hematology follow up.  Thanks, [name], MD Hematology/Oncology [facility] ----- Message ----- From: Interface, Lab In [place] Sent: 12/16/2023  11:20 AM EDT To: [name], MD

## 2023-12-24 ENCOUNTER — Other Ambulatory Visit: Payer: Self-pay | Admitting: Nurse Practitioner

## 2023-12-24 DIAGNOSIS — F419 Anxiety disorder, unspecified: Secondary | ICD-10-CM

## 2023-12-26 ENCOUNTER — Telehealth: Payer: Self-pay

## 2023-12-26 NOTE — Telephone Encounter (Signed)
 RX was sent to the pharmacy earlier today. I called to pharmacy to confirm rx received and after a 18 min hold I had to end call to attend to a in person patient.   I called patient and left a detailed message informing her that rx was sent

## 2023-12-26 NOTE — Telephone Encounter (Signed)
 Copied from CRM [id number]. Topic: Clinical - Prescription Issue >> Dec 26, 2023  3:42 PM Suzette B wrote: Reason for CRM: busPIRone  (BUSPAR ) 5 MG tablet, [phone], pt states that she was informed that this medication refill was denied, she states she takes this for anxiety and is wondering if the provider denied the refill. I advised the patient that the refill had been sent in at E-Prescribing Status: Receipt confirmed by pharmacy (12/26/2023 12:34 PM EST) she states the pharmacy advised her the provider denied the refill. Please call patient to advise. Patient stated she was expecting a call back by close of day because she is needing that medication.

## 2023-12-30 ENCOUNTER — Ambulatory Visit: Payer: Self-pay | Admitting: Nurse Practitioner

## 2024-01-06 ENCOUNTER — Other Ambulatory Visit: Payer: Self-pay | Admitting: Nurse Practitioner

## 2024-01-06 DIAGNOSIS — G8929 Other chronic pain: Secondary | ICD-10-CM

## 2024-01-06 MED ORDER — HYDROCODONE-ACETAMINOPHEN 10-325 MG PO TABS: ORAL_TABLET | ORAL | 0 refills | Status: DC

## 2024-01-06 NOTE — Telephone Encounter (Signed)
 Patient has request for refill on 01/06/2024. Patient last refill was 12/09/23. Patient has contract on file dated 03/26/22. Patient has upcoming appointment 03/23/23.  Medication pend and sent to PCP Arch, Jessica K, NP) for approval.

## 2024-01-06 NOTE — Telephone Encounter (Signed)
 Copied from CRM [id number]. Topic: Clinical - Medication Refill >> Jan 06, 2024  9:36 AM Alfonso ORN wrote: Medication: HYDROcodone -acetaminophen  (NORCO) 10-325 MG tablet  Has the patient contacted their pharmacy? Yes (Agent: If no, request that the patient contact the pharmacy for the refill. If patient does not wish to contact the pharmacy document the reason why and proceed with request.) (Agent: If yes, when and what did the pharmacy advise?)  This is the patient's preferred pharmacy:  [facility] #[id number] - [place], [place] - [address] AT [facility] [address] [place] [address] Phone: [phone] Fax: [phone]  Is this the correct pharmacy for this prescription? Yes If no, delete pharmacy and type the correct one.   Has the prescription been filled recently? No  Is the patient out of the medication? No , will be out the end of the days  Has the patient been seen for an appointment in the last year OR does the patient have an upcoming appointment? Yes  Can we respond through MyChart? No  Agent: Please be advised that Rx refills may take up to 3 business days. We ask that you follow-up with your pharmacy.

## 2024-01-21 ENCOUNTER — Other Ambulatory Visit: Payer: Self-pay | Admitting: Nurse Practitioner

## 2024-01-21 DIAGNOSIS — F32A Depression, unspecified: Secondary | ICD-10-CM

## 2024-01-21 DIAGNOSIS — M5441 Lumbago with sciatica, right side: Secondary | ICD-10-CM

## 2024-01-21 DIAGNOSIS — K219 Gastro-esophageal reflux disease without esophagitis: Secondary | ICD-10-CM

## 2024-01-26 ENCOUNTER — Other Ambulatory Visit: Payer: Self-pay | Admitting: Nurse Practitioner

## 2024-01-26 DIAGNOSIS — I10 Essential (primary) hypertension: Secondary | ICD-10-CM

## 2024-01-26 DIAGNOSIS — R252 Cramp and spasm: Secondary | ICD-10-CM

## 2024-02-03 ENCOUNTER — Ambulatory Visit: Admitting: Nurse Practitioner

## 2024-02-03 ENCOUNTER — Other Ambulatory Visit: Payer: Self-pay | Admitting: Nurse Practitioner

## 2024-02-03 DIAGNOSIS — G8929 Other chronic pain: Secondary | ICD-10-CM

## 2024-02-03 MED ORDER — HYDROCODONE-ACETAMINOPHEN 10-325 MG PO TABS: ORAL_TABLET | ORAL | 0 refills | Status: DC

## 2024-02-03 NOTE — Telephone Encounter (Signed)
 High Risk Warning Populated when attempting to refill, I will send to Provider for further review

## 2024-02-03 NOTE — Telephone Encounter (Signed)
 Copied from CRM #[id number]. Topic: Clinical - Medication Refill >> Feb 03, 2024  8:38 AM Graeme ORN wrote: Medication: HYDROcodone -acetaminophen  (NORCO) 10-325 MG tablet   Has the patient contacted their pharmacy? Yes (Agent: If no, request that the patient contact the pharmacy for the refill. If patient does not wish to contact the pharmacy document the reason why and proceed with request.) (Agent: If yes, when and what did the pharmacy advise?) Call provider   This is the patient's preferred pharmacy:  [facility] #[id number] - [place], [place] - [address] AT [facility] [address] [place] [address] Phone: [phone] Fax: [phone]   Is this the correct pharmacy for this prescription? Yes If no, delete pharmacy and type the correct one.   Has the prescription been filled recently? Yes  Is the patient out of the medication? No - will run out tomorrow morning   Has the patient been seen for an appointment in the last year OR does the patient have an upcoming appointment? Yes  Can we respond through MyChart? No  Agent: Please be advised that Rx refills may take up to 3 business days. We ask that you follow-up with your pharmacy.

## 2024-02-08 ENCOUNTER — Other Ambulatory Visit: Payer: Self-pay | Admitting: Nurse Practitioner

## 2024-02-08 DIAGNOSIS — E782 Mixed hyperlipidemia: Secondary | ICD-10-CM

## 2024-02-20 ENCOUNTER — Encounter: Admitting: Nurse Practitioner

## 2024-02-20 NOTE — Progress Notes (Signed)
 This encounter was created in error - please disregard.

## 2024-03-02 ENCOUNTER — Other Ambulatory Visit: Payer: Self-pay | Admitting: Nurse Practitioner

## 2024-03-02 DIAGNOSIS — M5441 Lumbago with sciatica, right side: Secondary | ICD-10-CM

## 2024-03-02 DIAGNOSIS — I10 Essential (primary) hypertension: Secondary | ICD-10-CM

## 2024-03-02 MED ORDER — HYDROCODONE-ACETAMINOPHEN 10-325 MG PO TABS: ORAL_TABLET | ORAL | 0 refills | Status: AC

## 2024-03-02 MED ORDER — TRIAMTERENE-HCTZ 37.5-25 MG PO TABS: 1.0000 | ORAL_TABLET | Freq: Every day | ORAL | 0 refills | Status: AC

## 2024-03-02 NOTE — Telephone Encounter (Signed)
Patient requested refill.  Pended Rx and sent to Jessica for approval due to HIGH ALERT Warning.  

## 2024-03-02 NOTE — Telephone Encounter (Signed)
 Copied from CRM [id number]. Topic: Clinical - Medication Refill >> Mar 02, 2024  8:00 AM Suzette B wrote: Medication:  HYDROcodone -acetaminophen  (NORCO) 10-325 MG tablet  Has the patient contacted their pharmacy? Yes The pharmacist explained refill was due on the 10th   This is the patient's preferred pharmacy:  [facility] #[id number] - [place], [place] - [address] AT [facility] [address] [place] [address] Phone: [phone] Fax: [phone]   Is this the correct pharmacy for this prescription? Yes If no, delete pharmacy and type the correct one.   Has the prescription been filled recently? Yes  Is the patient out of the medication? No have enough for today and saturday  Has the patient been seen for an appointment in the last year OR does the patient have an upcoming appointment? Yes  Can we respond through MyChart? No  Agent: Please be advised that Rx refills may take up to 3 business days. We ask that you follow-up with your pharmacy.

## 2024-03-02 NOTE — Telephone Encounter (Signed)
 Patient requested refill.  Epic LR: 02/03/2024 Contract Date: 03/26/2022-added note to upcoming appointment to update.   Pended Rx and sent to [facility] for approval.

## 2024-03-02 NOTE — Telephone Encounter (Signed)
 There will be NO additional refills until appt is made- has missed several appts. If unable to come to appt will need to wean medications

## 2024-03-02 NOTE — Telephone Encounter (Signed)
 Copied from CRM [id number]. Topic: Clinical - Medication Refill >> Mar 02, 2024  8:06 AM Suzette B wrote: Medication:  triamterene -hydrochlorothiazide (MAXZIDE-25) 37.5-25 MG tablet - 90 day supply please   Has the patient contacted their pharmacy? Yes: pt is out of bp medication: she normally uses [facility] for here bp medication but due to mixup with insurance, she will have to temporarily use the pharmacy listed below until February where everything is reinstated and starts     This is the patient's preferred pharmacy:  [id number] - [place], [place] - [address] [facility] [address] [place] [place] [address] Phone: [phone] Fax: [phone]    Is this the correct pharmacy for this prescription? Yes its temporary until February due to insurance mix up   If no, delete pharmacy and type the correct one.   Has the prescription been filled recently? Yes  Is the patient out of the medication? Yes- she only has one for today   Has the patient been seen for an appointment in the last year OR does the patient have an upcoming appointment? Yes  Can we respond through MyChart? No  Agent: Please be advised that Rx refills may take up to 3 business days. We ask that you follow-up with your pharmacy.   ----------------------------------------------------------------------- From previous Reason for Contact - Other: Reason for CRM:

## 2024-03-05 ENCOUNTER — Telehealth: Payer: Self-pay

## 2024-03-05 NOTE — Telephone Encounter (Addendum)
 Spoke with patient stated she does have insurance but her insurance will not start until February, patient also stated that she does has an appointment on February 9th because of transportation. Patient wanted to know why her medication was cut of her hydrocodone  of 21 tablets and that she is asking for another refill for the hydrocodone  because she will run out before her appointment next month. Which I did the response of Eubanks, Jessica K, NP as it was noted to the pharmacy that there will be NO additional refills until appt is made- has missed several appts. If unable to come to appt will need to wean medications. Patient does understand but also stated that she's is out of medication for high blood pressure medication and I also did let the patient know that her high blood pressure was sent to the pharmacy.   Message sent to [name], NP

## 2024-03-05 NOTE — Telephone Encounter (Signed)
 Noted if she needs help with cutting back on her medication I can help her with this. Due to the fact she has not been seen we can not prescribe any more pain medication.  She may want to look into seeing someone closer if transportation is going to be an ongoing issue.

## 2024-03-05 NOTE — Telephone Encounter (Signed)
 Copied from CRM #[id number]. Topic: Clinical - Medication Question >> Mar 05, 2024  8:09 AM Diannia H wrote: Reason for CRM: Patient is wanting to speak to [facility] about her blood pressure medicine. She states there was a mix up in her coverage and she can't do anything with [employer] until February and she is needing her medicine filled. She has been without it for a few days. Could you assist? Patients callback number is [phone].

## 2024-03-05 NOTE — Telephone Encounter (Signed)
 Spoke with patient and patient that she is not happy and want to asked if she can speak with Harlene by giving her a call  because patient is in a lot of pain.   Message sent to [name], NP

## 2024-03-06 NOTE — Telephone Encounter (Signed)
 Patient notified not satisfied

## 2024-03-06 NOTE — Telephone Encounter (Signed)
 Recommend to decrease norco to half tablet three times daily  Then to cut back to half tablet twice daily and then to cut back to half tablet daily for pain She can supplement with tylenol  325 mg tablet 1 tablet every 6 hours as needed for pain Max of 3000 mg of tylenol  from her hydrocodone  and tylenol  OTC in 24 hours.  She will need to wean her pain medication and not stop abruptly.  Unable to continue to prescribe since she is not keeping her follow up visit.

## 2024-03-12 ENCOUNTER — Ambulatory Visit: Admitting: Nurse Practitioner

## 2024-03-16 ENCOUNTER — Telehealth: Payer: Self-pay

## 2024-03-16 ENCOUNTER — Other Ambulatory Visit (HOSPITAL_COMMUNITY): Payer: Self-pay

## 2024-03-16 NOTE — Telephone Encounter (Signed)
 Pharmacy Patient Advocate Encounter   Received notification from Physician's Office that prior authorization for Norco10-325 is required/requested.   Insurance verification completed.   The patient is insured through [employer].   Per test claim: The current 7 day co-pay is, $0.00.  No PA needed at this time. This test claim was processed through [facility]- copay amounts may vary at other pharmacies due to pharmacy/plan contracts, or as the patient moves through the different stages of their insurance plan.

## 2024-03-16 NOTE — Telephone Encounter (Signed)
 ERROR

## 2024-03-22 ENCOUNTER — Other Ambulatory Visit: Payer: Self-pay | Admitting: Nurse Practitioner

## 2024-03-22 DIAGNOSIS — F419 Anxiety disorder, unspecified: Secondary | ICD-10-CM

## 2024-03-22 NOTE — Telephone Encounter (Signed)
 Noted.

## 2024-04-02 ENCOUNTER — Ambulatory Visit: Admitting: Nurse Practitioner
